# Patient Record
Sex: Female | Born: 1940 | Race: White | Hispanic: No | Marital: Single | Smoking: Current some day smoker
Health system: Southern US, Community
[De-identification: ages and names within clinical notes are randomized; demographics above are authoritative.]

## PROBLEM LIST (undated history)

## (undated) DIAGNOSIS — Z85528 Personal history of other malignant neoplasm of kidney: Secondary | ICD-10-CM

## (undated) DIAGNOSIS — N3941 Urge incontinence: Secondary | ICD-10-CM

## (undated) DIAGNOSIS — D649 Anemia, unspecified: Secondary | ICD-10-CM

## (undated) DIAGNOSIS — K219 Gastro-esophageal reflux disease without esophagitis: Secondary | ICD-10-CM

## (undated) DIAGNOSIS — F329 Major depressive disorder, single episode, unspecified: Secondary | ICD-10-CM

## (undated) DIAGNOSIS — K529 Noninfective gastroenteritis and colitis, unspecified: Secondary | ICD-10-CM

## (undated) DIAGNOSIS — F32A Depression, unspecified: Secondary | ICD-10-CM

## (undated) DIAGNOSIS — G2581 Restless legs syndrome: Secondary | ICD-10-CM

## (undated) DIAGNOSIS — K2950 Unspecified chronic gastritis without bleeding: Secondary | ICD-10-CM

## (undated) DIAGNOSIS — J449 Chronic obstructive pulmonary disease, unspecified: Secondary | ICD-10-CM

## (undated) DIAGNOSIS — Z8719 Personal history of other diseases of the digestive system: Secondary | ICD-10-CM

## (undated) DIAGNOSIS — C679 Malignant neoplasm of bladder, unspecified: Secondary | ICD-10-CM

## (undated) DIAGNOSIS — M810 Age-related osteoporosis without current pathological fracture: Secondary | ICD-10-CM

## (undated) DIAGNOSIS — E785 Hyperlipidemia, unspecified: Secondary | ICD-10-CM

## (undated) DIAGNOSIS — Q600 Renal agenesis, unilateral: Secondary | ICD-10-CM

## (undated) DIAGNOSIS — K449 Diaphragmatic hernia without obstruction or gangrene: Secondary | ICD-10-CM

## (undated) DIAGNOSIS — K5730 Diverticulosis of large intestine without perforation or abscess without bleeding: Secondary | ICD-10-CM

## (undated) DIAGNOSIS — K269 Duodenal ulcer, unspecified as acute or chronic, without hemorrhage or perforation: Secondary | ICD-10-CM

## (undated) DIAGNOSIS — M1990 Unspecified osteoarthritis, unspecified site: Secondary | ICD-10-CM

## (undated) DIAGNOSIS — Z8551 Personal history of malignant neoplasm of bladder: Secondary | ICD-10-CM

## (undated) DIAGNOSIS — F411 Generalized anxiety disorder: Secondary | ICD-10-CM

## (undated) HISTORY — PX: LAPAROSCOPIC CHOLECYSTECTOMY: SUR755

## (undated) HISTORY — PX: COLON SURGERY: SHX602

## (undated) HISTORY — PX: OTHER SURGICAL HISTORY: SHX169

## (undated) HISTORY — PX: HEMICOLECTOMY: SHX854

## (undated) HISTORY — PX: INGUINAL HERNIA REPAIR: SUR1180

---

## 1971-08-20 HISTORY — PX: ABDOMINAL HYSTERECTOMY: SHX81

## 2014-08-19 DIAGNOSIS — IMO0002 Reserved for concepts with insufficient information to code with codable children: Secondary | ICD-10-CM

## 2014-08-19 HISTORY — DX: Reserved for concepts with insufficient information to code with codable children: IMO0002

## 2015-08-20 DIAGNOSIS — Z8551 Personal history of malignant neoplasm of bladder: Secondary | ICD-10-CM

## 2015-08-20 HISTORY — DX: Personal history of malignant neoplasm of bladder: Z85.51

## 2017-08-01 ENCOUNTER — Encounter (HOSPITAL_COMMUNITY): Payer: Self-pay

## 2017-08-01 ENCOUNTER — Emergency Department (HOSPITAL_COMMUNITY): Payer: Medicare (Managed Care)

## 2017-08-01 ENCOUNTER — Inpatient Hospital Stay (HOSPITAL_COMMUNITY)
Admission: EM | Admit: 2017-08-01 | Discharge: 2017-08-08 | DRG: 377 | Disposition: A | Discharge status: Home / Self Care | Payer: Medicare (Managed Care) | Attending: Internal Medicine | Admitting: Internal Medicine

## 2017-08-01 ENCOUNTER — Other Ambulatory Visit: Payer: Self-pay

## 2017-08-01 DIAGNOSIS — E43 Unspecified severe protein-calorie malnutrition: Secondary | ICD-10-CM

## 2017-08-01 DIAGNOSIS — D500 Iron deficiency anemia secondary to blood loss (chronic): Secondary | ICD-10-CM | POA: Diagnosis present

## 2017-08-01 DIAGNOSIS — R578 Other shock: Secondary | ICD-10-CM | POA: Diagnosis present

## 2017-08-01 DIAGNOSIS — K632 Fistula of intestine: Secondary | ICD-10-CM | POA: Diagnosis present

## 2017-08-01 DIAGNOSIS — Z681 Body mass index (BMI) 19 or less, adult: Secondary | ICD-10-CM

## 2017-08-01 DIAGNOSIS — K269 Duodenal ulcer, unspecified as acute or chronic, without hemorrhage or perforation: Secondary | ICD-10-CM | POA: Diagnosis not present

## 2017-08-01 DIAGNOSIS — K267 Chronic duodenal ulcer without hemorrhage or perforation: Secondary | ICD-10-CM | POA: Diagnosis present

## 2017-08-01 DIAGNOSIS — E876 Hypokalemia: Secondary | ICD-10-CM | POA: Diagnosis not present

## 2017-08-01 DIAGNOSIS — I714 Abdominal aortic aneurysm, without rupture: Secondary | ICD-10-CM | POA: Diagnosis present

## 2017-08-01 DIAGNOSIS — K315 Obstruction of duodenum: Secondary | ICD-10-CM | POA: Diagnosis present

## 2017-08-01 DIAGNOSIS — D62 Acute posthemorrhagic anemia: Secondary | ICD-10-CM | POA: Diagnosis present

## 2017-08-01 DIAGNOSIS — K449 Diaphragmatic hernia without obstruction or gangrene: Secondary | ICD-10-CM | POA: Diagnosis present

## 2017-08-01 DIAGNOSIS — K922 Gastrointestinal hemorrhage, unspecified: Secondary | ICD-10-CM | POA: Diagnosis not present

## 2017-08-01 DIAGNOSIS — K2980 Duodenitis without bleeding: Secondary | ICD-10-CM | POA: Diagnosis present

## 2017-08-01 DIAGNOSIS — Z8719 Personal history of other diseases of the digestive system: Secondary | ICD-10-CM

## 2017-08-01 DIAGNOSIS — F17210 Nicotine dependence, cigarettes, uncomplicated: Secondary | ICD-10-CM | POA: Diagnosis present

## 2017-08-01 DIAGNOSIS — F419 Anxiety disorder, unspecified: Secondary | ICD-10-CM | POA: Diagnosis present

## 2017-08-01 DIAGNOSIS — G2581 Restless legs syndrome: Secondary | ICD-10-CM | POA: Diagnosis present

## 2017-08-01 DIAGNOSIS — E785 Hyperlipidemia, unspecified: Secondary | ICD-10-CM | POA: Diagnosis present

## 2017-08-01 DIAGNOSIS — K264 Chronic or unspecified duodenal ulcer with hemorrhage: Secondary | ICD-10-CM | POA: Diagnosis present

## 2017-08-01 DIAGNOSIS — Z905 Acquired absence of kidney: Secondary | ICD-10-CM

## 2017-08-01 DIAGNOSIS — L988 Other specified disorders of the skin and subcutaneous tissue: Secondary | ICD-10-CM

## 2017-08-01 DIAGNOSIS — J449 Chronic obstructive pulmonary disease, unspecified: Secondary | ICD-10-CM | POA: Diagnosis present

## 2017-08-01 DIAGNOSIS — R51 Headache: Secondary | ICD-10-CM | POA: Diagnosis not present

## 2017-08-01 HISTORY — DX: Hyperlipidemia, unspecified: E78.5

## 2017-08-01 HISTORY — DX: Duodenal ulcer, unspecified as acute or chronic, without hemorrhage or perforation: K26.9

## 2017-08-01 HISTORY — DX: Depression, unspecified: F32.A

## 2017-08-01 HISTORY — DX: Personal history of other diseases of the digestive system: Z87.19

## 2017-08-01 HISTORY — DX: Major depressive disorder, single episode, unspecified: F32.9

## 2017-08-01 LAB — URINALYSIS, ROUTINE W REFLEX MICROSCOPIC
BILIRUBIN URINE: NEGATIVE
Glucose, UA: NEGATIVE mg/dL
Hgb urine dipstick: NEGATIVE
KETONES UR: NEGATIVE mg/dL
Nitrite: NEGATIVE
Protein, ur: NEGATIVE mg/dL
Specific Gravity, Urine: 1.011 (ref 1.005–1.030)
pH: 6 (ref 5.0–8.0)

## 2017-08-01 LAB — COMPREHENSIVE METABOLIC PANEL
ALK PHOS: 127 U/L — AB (ref 38–126)
ALT: 9 U/L — AB (ref 14–54)
AST: 16 U/L (ref 15–41)
Albumin: 3.6 g/dL (ref 3.5–5.0)
Anion gap: 8 (ref 5–15)
BUN: 14 mg/dL (ref 6–20)
CALCIUM: 9 mg/dL (ref 8.9–10.3)
CHLORIDE: 107 mmol/L (ref 101–111)
CO2: 23 mmol/L (ref 22–32)
CREATININE: 0.92 mg/dL (ref 0.44–1.00)
GFR, EST NON AFRICAN AMERICAN: 60 mL/min — AB (ref >60)
Glucose, Bld: 113 mg/dL — ABNORMAL HIGH (ref 65–99)
Potassium: 4.6 mmol/L (ref 3.5–5.1)
Sodium: 138 mmol/L (ref 135–145)
Total Bilirubin: 0.2 mg/dL — ABNORMAL LOW (ref 0.3–1.2)
Total Protein: 7.2 g/dL (ref 6.5–8.1)

## 2017-08-01 LAB — CBC
HCT: 20.1 % — ABNORMAL LOW (ref 36.0–46.0)
HEMATOCRIT: 25.9 % — AB (ref 36.0–46.0)
HEMOGLOBIN: 7.9 g/dL — AB (ref 12.0–15.0)
Hemoglobin: 6 g/dL — CL (ref 12.0–15.0)
MCH: 22.6 pg — ABNORMAL LOW (ref 26.0–34.0)
MCH: 23.9 pg — ABNORMAL LOW (ref 26.0–34.0)
MCHC: 29.9 g/dL — ABNORMAL LOW (ref 30.0–36.0)
MCHC: 30.5 g/dL (ref 30.0–36.0)
MCV: 75.8 fL — AB (ref 78.0–100.0)
MCV: 78.5 fL (ref 78.0–100.0)
PLATELETS: 251 10*3/uL (ref 150–400)
PLATELETS: 253 10*3/uL (ref 150–400)
RBC: 2.65 MIL/uL — ABNORMAL LOW (ref 3.87–5.11)
RBC: 3.3 MIL/uL — AB (ref 3.87–5.11)
RDW: 17 % — AB (ref 11.5–15.5)
RDW: 17.5 % — ABNORMAL HIGH (ref 11.5–15.5)
WBC: 9.1 10*3/uL (ref 4.0–10.5)
WBC: 6.9 10*3/uL (ref 4.0–10.5)

## 2017-08-01 LAB — CBC WITH DIFFERENTIAL/PLATELET
BASOS ABS: 0 10*3/uL (ref 0.0–0.1)
Basophils Relative: 0 %
EOS PCT: 2 %
Eosinophils Absolute: 0.3 10*3/uL (ref 0.0–0.7)
HCT: 26.3 % — ABNORMAL LOW (ref 36.0–46.0)
HEMOGLOBIN: 7.7 g/dL — AB (ref 12.0–15.0)
LYMPHS ABS: 0.6 10*3/uL — AB (ref 0.7–4.0)
LYMPHS PCT: 4 %
MCH: 22.1 pg — AB (ref 26.0–34.0)
MCHC: 29.3 g/dL — ABNORMAL LOW (ref 30.0–36.0)
MCV: 75.6 fL — AB (ref 78.0–100.0)
Monocytes Absolute: 0.7 10*3/uL (ref 0.1–1.0)
Monocytes Relative: 5 %
NEUTROS PCT: 89 %
Neutro Abs: 12.5 10*3/uL — ABNORMAL HIGH (ref 1.7–7.7)
PLATELETS: 398 10*3/uL (ref 150–400)
RBC: 3.48 MIL/uL — AB (ref 3.87–5.11)
RDW: 17.1 % — ABNORMAL HIGH (ref 11.5–15.5)
WBC: 14.2 10*3/uL — AB (ref 4.0–10.5)

## 2017-08-01 LAB — HEPATIC FUNCTION PANEL
ALBUMIN: 2.7 g/dL — AB (ref 3.5–5.0)
ALT: 7 U/L — ABNORMAL LOW (ref 14–54)
AST: 13 U/L — AB (ref 15–41)
Alkaline Phosphatase: 98 U/L (ref 38–126)
Bilirubin, Direct: <0.1 mg/dL — ABNORMAL LOW (ref 0.1–0.5)
TOTAL PROTEIN: 5.6 g/dL — AB (ref 6.5–8.1)
Total Bilirubin: 0.6 mg/dL (ref 0.3–1.2)

## 2017-08-01 LAB — APTT: APTT: 26 s (ref 24–36)

## 2017-08-01 LAB — POC OCCULT BLOOD, ED: FECAL OCCULT BLD: POSITIVE — AB

## 2017-08-01 LAB — PROTIME-INR
INR: 0.98
PROTHROMBIN TIME: 12.9 s (ref 11.4–15.2)

## 2017-08-01 LAB — PHOSPHORUS: Phosphorus: 3.1 mg/dL (ref 2.5–4.6)

## 2017-08-01 LAB — PREPARE RBC (CROSSMATCH)

## 2017-08-01 LAB — MAGNESIUM: Magnesium: 1.5 mg/dL — ABNORMAL LOW (ref 1.7–2.4)

## 2017-08-01 LAB — MRSA PCR SCREENING: MRSA by PCR: NEGATIVE

## 2017-08-01 LAB — I-STAT CG4 LACTIC ACID, ED: Lactic Acid, Venous: 1 mmol/L (ref 0.5–1.9)

## 2017-08-01 LAB — LIPASE, BLOOD: LIPASE: 23 U/L (ref 11–51)

## 2017-08-01 LAB — ABO/RH: ABO/RH(D): O POS

## 2017-08-01 MED ORDER — LACTATED RINGERS IV BOLUS (SEPSIS)
1000.0000 mL | Freq: Once | INTRAVENOUS | Status: AC
  Administered 2017-08-01: 1000 mL via INTRAVENOUS

## 2017-08-01 MED ORDER — SODIUM CHLORIDE 0.9 % IV SOLN: Freq: Once | INTRAVENOUS | Status: DC

## 2017-08-01 MED ORDER — IOPAMIDOL (ISOVUE-300) INJECTION 61%
INTRAVENOUS | Status: AC
  Administered 2017-08-01: 100 mL
  Filled 2017-08-01: qty 100

## 2017-08-01 MED ORDER — MORPHINE SULFATE (PF) 4 MG/ML IV SOLN
4.0000 mg | Freq: Once | INTRAVENOUS | Status: AC
  Administered 2017-08-01: 4 mg via INTRAVENOUS
  Filled 2017-08-01: qty 1

## 2017-08-01 MED ORDER — ASPIRIN 300 MG RE SUPP: 300.0000 mg | RECTAL | Status: DC

## 2017-08-01 MED ORDER — SODIUM CHLORIDE 0.9 % IV SOLN
8.0000 mg/h | INTRAVENOUS | Status: AC
  Administered 2017-08-01 – 2017-08-04 (×7): 8 mg/h via INTRAVENOUS
  Filled 2017-08-01 (×10): qty 80

## 2017-08-01 MED ORDER — METRONIDAZOLE IN NACL 5-0.79 MG/ML-% IV SOLN: 500.0000 mg | Freq: Once | INTRAVENOUS | Status: DC

## 2017-08-01 MED ORDER — DEXTROSE 5 % IV SOLN: 2.0000 g | Freq: Once | INTRAVENOUS | Status: DC

## 2017-08-01 MED ORDER — PANTOPRAZOLE SODIUM 40 MG IV SOLR
40.0000 mg | Freq: Once | INTRAVENOUS | Status: AC
  Administered 2017-08-01: 40 mg via INTRAVENOUS
  Filled 2017-08-01: qty 40

## 2017-08-01 MED ORDER — METOCLOPRAMIDE HCL 5 MG/ML IJ SOLN
5.0000 mg | Freq: Once | INTRAMUSCULAR | Status: AC
  Administered 2017-08-01: 5 mg via INTRAVENOUS
  Filled 2017-08-01: qty 2

## 2017-08-01 MED ORDER — ASPIRIN 81 MG PO CHEW: 324.0000 mg | CHEWABLE_TABLET | ORAL | Status: DC

## 2017-08-01 MED ORDER — FENTANYL CITRATE (PF) 100 MCG/2ML IJ SOLN
12.5000 ug | INTRAMUSCULAR | Status: DC | PRN
  Administered 2017-08-01 – 2017-08-08 (×34): 12.5 ug via INTRAVENOUS
  Filled 2017-08-01 (×35): qty 2

## 2017-08-01 MED ORDER — SODIUM CHLORIDE 0.9 % IV SOLN: 250.0000 mL | INTRAVENOUS | Status: DC | PRN

## 2017-08-01 MED ORDER — POTASSIUM CHLORIDE CRYS ER 20 MEQ PO TBCR: 20.0000 meq | EXTENDED_RELEASE_TABLET | Freq: Once | ORAL | Status: DC

## 2017-08-01 MED ORDER — METRONIDAZOLE IN NACL 5-0.79 MG/ML-% IV SOLN
500.0000 mg | Freq: Three times a day (TID) | INTRAVENOUS | Status: DC
  Administered 2017-08-01 – 2017-08-04 (×8): 500 mg via INTRAVENOUS
  Filled 2017-08-01 (×10): qty 100

## 2017-08-01 MED ORDER — CIPROFLOXACIN IN D5W 400 MG/200ML IV SOLN
400.0000 mg | Freq: Two times a day (BID) | INTRAVENOUS | Status: DC
  Administered 2017-08-02 – 2017-08-04 (×5): 400 mg via INTRAVENOUS
  Filled 2017-08-01 (×6): qty 200

## 2017-08-01 MED ORDER — SODIUM CHLORIDE 0.9 % IV BOLUS (SEPSIS)
1000.0000 mL | Freq: Once | INTRAVENOUS | Status: AC
  Administered 2017-08-01: 1000 mL via INTRAVENOUS

## 2017-08-01 MED ORDER — PANTOPRAZOLE SODIUM 40 MG IV SOLR
40.0000 mg | Freq: Two times a day (BID) | INTRAVENOUS | Status: DC
  Administered 2017-08-05 – 2017-08-07 (×6): 40 mg via INTRAVENOUS
  Filled 2017-08-01 (×6): qty 40

## 2017-08-01 MED ORDER — MAGNESIUM SULFATE 4 GM/100ML IV SOLN
4.0000 g | Freq: Once | INTRAVENOUS | Status: AC
  Administered 2017-08-01: 4 g via INTRAVENOUS
  Filled 2017-08-01: qty 100

## 2017-08-01 MED ORDER — DIPHENHYDRAMINE HCL 50 MG/ML IJ SOLN
12.5000 mg | Freq: Once | INTRAMUSCULAR | Status: AC
  Administered 2017-08-01: 12.5 mg via INTRAVENOUS
  Filled 2017-08-01: qty 1

## 2017-08-01 MED ORDER — SODIUM CHLORIDE 0.9 % IV SOLN
250.0000 mL | INTRAVENOUS | Status: DC | PRN
  Administered 2017-08-01 – 2017-08-02 (×2): 250 mL via INTRAVENOUS

## 2017-08-01 MED ORDER — POTASSIUM CHLORIDE 10 MEQ/100ML IV SOLN
10.0000 meq | Freq: Once | INTRAVENOUS | Status: AC
  Administered 2017-08-01: 10 meq via INTRAVENOUS
  Filled 2017-08-01: qty 100

## 2017-08-01 MED ORDER — CIPROFLOXACIN IN D5W 400 MG/200ML IV SOLN
400.0000 mg | Freq: Once | INTRAVENOUS | Status: AC
  Administered 2017-08-01: 400 mg via INTRAVENOUS
  Filled 2017-08-01: qty 200

## 2017-08-01 MED ORDER — ROPINIROLE HCL 1 MG PO TABS
1.0000 mg | ORAL_TABLET | Freq: Once | ORAL | Status: DC
  Filled 2017-08-01: qty 1

## 2017-08-01 MED ORDER — ONDANSETRON HCL 4 MG/2ML IJ SOLN
4.0000 mg | Freq: Once | INTRAMUSCULAR | Status: AC
  Administered 2017-08-01: 4 mg via INTRAVENOUS
  Filled 2017-08-01: qty 2

## 2017-08-01 NOTE — ED Notes (Signed)
Patient transported to X-ray 

## 2017-08-01 NOTE — ED Triage Notes (Signed)
Transported from home by [facility] for generalized abdominal pain x 1 week with associated n/v/d. Pt reports bloody stool 2 days ago. Hx of diverticulitis per EMS.

## 2017-08-01 NOTE — Consult Note (Signed)
Referring Provider:  Dr. Myrene Buddy Primary Care Physician:  System, Pcp Not In Primary Gastroenterologist:  Althia Forts  Reason for Consultation:  Duodenal ulcer with possible fistulization into transverse colon  HPI: Erica York is a 76 y.o. female with past medical history of duodenal ulcer 40 years ago, history of diverticulitis status post partial colectomy, multiple hernia repairs and history of NSAID use came into the hospital for further evaluation of abdominal pain.  Patient started noticing epigastric pain around 2 weeks ago. Described as a sharp with radiation towards back. Associated with nausea and nonbloody vomiting. Patient had 1 episode of black tarry stool and bright red blood per rectum one week ago which is resolved now. She noted worsening of her epigastric pain 2 days ago and decided to come to the hospital for further evaluation. Last bowel movement this morning which was normal according to patient. She takes Excedrin on a daily basis for headache. Denied use of any PPI.  Not sure of any previous endoscopy.  Past Medical History:  Diagnosis Date  . Anxiety   . Depression   . Hyperlipidemia     PMH :  - History of duodenal ulcer - Anxiety -Restless leg syndrome  PSH : - Status post partial colon resection for diverticulitis - History of hernia repair  FH : Negative for colon cancer.    Prior to Admission medications   Medication Sig Start Date End Date Taking? Authorizing Provider  ALPRAZolam Duanne Moron) 0.5 MG tablet Take 0.5 mg by mouth 3 (three) times daily as needed for anxiety.   Yes [provider]  colesevelam (WELCHOL) 625 MG tablet Take 625 mg by mouth daily.   Yes [provider]  potassium chloride SA (K-DUR,KLOR-CON) 20 MEQ tablet Take 20 mEq by mouth 2 (two) times daily.   Yes [provider]  rOPINIRole (REQUIP) 1 MG tablet Take 0.5-1 mg by mouth at bedtime as needed (Sleep).   Yes [provider]  sertraline  (ZOLOFT) 100 MG tablet Take 100 mg by mouth at bedtime.   Yes [provider]    Scheduled Meds: . aspirin  324 mg Oral NOW   Or  . aspirin  300 mg Rectal NOW  . [START ON 08/05/2017] pantoprazole  40 mg Intravenous Q12H   Continuous Infusions: . sodium chloride    . sodium chloride    . ceFEPime (MAXIPIME) IV    . lactated ringers    . metronidazole    . pantoprozole (PROTONIX) infusion 8 mg/hr (08/01/17 1233)   PRN Meds:.  Allergies as of 08/01/2017 - Review Complete 08/01/2017  Allergen Reaction Noted  . Zosyn [piperacillin sod-tazobactam so]  08/01/2017    History reviewed. No pertinent family history.  Social History   Socioeconomic History  . Marital status: Single    Spouse name: Not on file  . Number of children: Not on file  . Years of education: Not on file  . Highest education level: Not on file  Social Needs  . Financial resource strain: Not on file  . Food insecurity - worry: Not on file  . Food insecurity - inability: Not on file  . Transportation needs - medical: Not on file  . Transportation needs - non-medical: Not on file  Occupational History  . Not on file  Tobacco Use  . Smoking status: Current Some Day Smoker    Types: Cigarettes  . Smokeless tobacco: Never Used  Substance and Sexual Activity  . Alcohol use: No    Frequency:  Never  . Drug use: No  . Sexual activity: No  Other Topics Concern  . Not on file  Social History Narrative  . Not on file    Review of Systems: Review of Systems  Constitutional: Negative for chills and fever.  HENT: Negative for hearing loss and tinnitus.   Eyes: Negative for blurred vision and double vision.  Respiratory: Negative for cough and hemoptysis.   Cardiovascular: Negative for chest pain and palpitations.  Gastrointestinal: Positive for abdominal pain, blood in stool, heartburn, melena, nausea and vomiting. Negative for constipation and diarrhea.  Genitourinary: Negative for dysuria and  urgency.  Musculoskeletal: Positive for back pain and joint pain. Negative for myalgias and neck pain.  Skin: Negative for itching and rash.  Neurological: Positive for headaches. Negative for focal weakness and seizures.  Endo/Heme/Allergies: Does not bruise/bleed easily.  Psychiatric/Behavioral: Negative for hallucinations and suicidal ideas.    Physical Exam: Vital signs: Vitals:   08/01/17 1255 08/01/17 1338  BP: (!) 91/51 108/65  Pulse: 87 74  Resp:  20  Temp:  97.9 F (36.6 C)  SpO2:  100%     Physical Exam  Constitutional: She is oriented to person, place, and time. She appears well-developed and well-nourished. No distress.  HENT:  Head: Normocephalic and atraumatic.  Mouth/Throat: No oropharyngeal exudate.  Eyes: EOM are normal. No scleral icterus.  Neck: Normal range of motion. Neck supple. No thyromegaly present.  Cardiovascular: Normal rate, regular rhythm and normal heart sounds.  Pulmonary/Chest: Effort normal and breath sounds normal. No respiratory distress.  Abdominal: Soft. Bowel sounds are normal. She exhibits no distension. There is tenderness. There is no rebound and no guarding.  Scar marks from previous surgery with a small ventral hernia and epigastric and left upper quadrant tenderness to palpation. No peritoneal signs  Musculoskeletal: Normal range of motion. She exhibits no edema.  Neurological: She is alert and oriented to person, place, and time.  Skin: Skin is dry. No erythema.  Psychiatric: She has a normal mood and affect. Thought content normal.  Vitals reviewed.   GI:  Lab Results: Recent Labs    08/01/17 0752 08/01/17 1151  WBC 14.2* 9.1  HGB 7.7* 6.0*  HCT 26.3* 20.1*  PLT 398 251   BMET Recent Labs    08/01/17 0752  NA 138  K 4.6  CL 107  CO2 23  GLUCOSE 113*  BUN 14  CREATININE 0.92  CALCIUM 9.0   LFT Recent Labs    08/01/17 0752  PROT 7.2  ALBUMIN 3.6  AST 16  ALT 9*  ALKPHOS 127*  BILITOT 0.2*    PT/INR No results for input(s): LABPROT, INR in the last 72 hours.   Studies/Results: Ct Abdomen Pelvis W Contrast  Result Date: 08/01/2017 CLINICAL DATA:  Generalized abdominal pain, nausea, vomiting and diarrhea for 1 week with bloody stools. EXAM: CT ABDOMEN AND PELVIS WITH CONTRAST TECHNIQUE: Multidetector CT imaging of the abdomen and pelvis was performed using the standard protocol following bolus administration of intravenous contrast. CONTRAST:  32mL ISOVUE-300 IOPAMIDOL (ISOVUE-300) INJECTION 61% COMPARISON:  08/01/2017 abdominal radiographs . FINDINGS: Lower chest: Advanced emphysema at the lung bases. A few scattered small pulmonary nodules at both lung bases, largest 4 mm in the lingula (series 6/ image 5). Mild scarring versus atelectasis in the dependent lung bases. Coronary atherosclerosis. Hepatobiliary: Normal liver size. Granulomatous right liver dome calcification. No liver mass. Cholecystectomy. Mild diffuse intrahepatic biliary ductal dilatation. Prominent common bile duct dilation (19 mm diameter, with  smooth distal tapering of the common bile duct. No radiopaque choledocholithiasis. Pancreas: Mild diffuse main pancreatic duct dilation up to 5 mm diameter. No discrete pancreatic mass. Spleen: Normal size spleen. Hypodense 0.5 cm posterior splenic lesion is too small to characterize. Adrenals/Urinary Tract: Normal adrenals. Status post left nephrectomy with no mass or fluid collection in the left nephrectomy bed. Compensatory hypertrophy of the right kidney. Hypodense 1.2 cm renal cortical lesion in the medial lower right kidney (series 7/image 12) with density 22 HU. Subcentimeter hypodense renal cortical lesion in the lateral upper right kidney, too small to characterize. No right hydronephrosis. There is curvilinear calcification in the anterior superior midline bladder wall. Otherwise normal bladder. Stomach/Bowel: Normal nondistended stomach. There is circumferential wall  thickening throughout the first and second portions of the duodenum with associated mucosal hyperenhancement and periduodenal fat stranding. There is a suggestion of abnormal fistulous communication of the duodenal bulb with the transverse colon (series 5/ image 52). There is mild wall thickening and mild dilatation throughout the transverse colon. Normal caliber small bowel. No additional sites of small bowel wall thickening. Appendix not discretely visualized. No pericecal inflammatory changes . Postsurgical changes are noted from partial distal colectomy with intact appearing distal colonic anastomosis. Mild residual diverticulosis in the remnant left colon with no additional sites of large bowel wall thickening. Vascular/Lymphatic: Atherosclerotic abdominal aorta with right-sided 3.0 cm saccular infrarenal aortic aneurysm. Patent hepatic, portal, splenic and right renal veins. No pathologically enlarged lymph nodes in the abdomen or pelvis. Reproductive: Status post hysterectomy, with no abnormal findings at the vaginal cuff. No adnexal mass. Other: No pneumoperitoneum, ascites or focal fluid collection. Evidence of prior ventral abdominal mesh repair with no evidence of a recurrent ventral abdominal hernia. Musculoskeletal: No aggressive appearing focal osseous lesions. Mild L1, mild to moderate L2, severe L4 and mild L5 vertebral compression fractures of uncertain chronicity. Marked lumbar spondylosis. Prominent levocurvature of the thoracolumbar spine. Diffuse osteopenia. IMPRESSION: 1. 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 No free air. No abscess. Recommend GI consultation for consideration of upper endoscopic correlation. 2. Prominent common bile duct dilation status post cholecystectomy (CBD diameter 19 mm). No radiopaque choledocholithiasis. Recommend correlation with liver function tests. 3. Aortic Atherosclerosis (ICD10-I70.0)  and Emphysema (ICD10-J43.9). Coronary atherosclerosis. 4. Saccular 3.0 cm infrarenal abdominal aortic aneurysm. Recommend followup by ultrasound in 3 years. This recommendation follows ACR consensus guidelines: White Paper of the ACR Incidental Findings Committee II on Vascular Findings. J Am Coll Radiol 2013; 10:789-794. 5. Multilevel lumbar vertebral compression fractures of uncertain chronicity, probably chronic. 6. Scattered tiny pulmonary nodules at the lung bases, largest 4 mm. No follow-up needed if patient is low-risk (and has no known or suspected primary neoplasm). Non-contrast chest CT can be considered in 12 months if patient is high-risk. This recommendation follows the consensus statement: Guidelines for Management of Incidental Pulmonary Nodules Detected on CT Images: From the Fleischner Society 2017; Radiology 2017; 284:228-243. 7. Indeterminate small 1.2 cm low-attenuation renal cortical lesion in the medial lower right kidney, recommend evaluation on a short term outpatient basis with MRI (preferred) or CT abdomen without and with IV contrast to exclude a renal neoplasm. Electronically Signed   By: Ilona Sorrel M.D.   On: 08/01/2017 11:56   Dg Abdomen Acute W/chest  Result Date: 08/01/2017 CLINICAL DATA:  Mid abdominal pain. EXAM: DG ABDOMEN ACUTE W/ 1V CHEST COMPARISON:  None. FINDINGS: There are dilated loops of bowel in  the mid abdomen, containing gas fluid levels with maximum diameter of 4.4 cm. It is difficult to determine whether these represent small or large bowel loops. Extensive surgical changes in the abdomen and pelvis. Large area of punctate calcifications in the pelvis with uncertain etiology. Normal cardiac silhouette. Calcific atherosclerotic disease of the aorta. Upper lobe predominant emphysematous changes of the lungs. No evidence of focal consolidation, pleural effusion or pneumothorax. IMPRESSION: Nonspecific bowel gas pattern with dilated bowel loops containing gas fluid  levels in the mid abdomen. It is difficult to determine whether these represent small or large bowel loops, given extensive postsurgical changes in the abdomen. If these represent small bowel loops, then this is evidence of small bowel ileus or early/ incomplete small bowel obstruction. Emphysematous changes of the lungs. Electronically Signed   By: Fidela Salisbury M.D.   On: 08/01/2017 10:41    Impression/Plan: - Epigastric abdominal pain with CT scan showing possible large duodenal ulcer with fistulization to the transverse colon.- Patient with history of duodenal ulcer and ongoing NSAID use. - Melena with bright red blood per rectum one week ago. Resolved now. Having normal bowel movements now. - Acute blood loss anemia. - NSAID use/ almost daily Excedrin use - History of diverticulitis status post partial colectomy  - H/O  Multiple abdominal hernia repairs.  Recommendations ------------------------- - Patient with hemoglobin of 6.0 at this time. Had a bleeding episode 1 week ago which is resolved now. CT scan findings of  possible large duodenal ulcer with fistulization to the transverse colon reviewed.  - Recommend supportive care for now  - Protonix drip - Transfuse as needed to keep hemoglobin around 8. Avoid over transfusion - Recommend surgical evaluation. Patient's abdominal exam findings are not concerning for acute abdomen at this time - Hold off on EGD at this time given possible high-risk of perforation. - Patient was advised to Avoid NSAIDs. - Keep nothing by mouth for now - GI will follow.    LOS: 0 days   Otis Brace  MD, FACP 08/01/2017, 2:01 PM  Contact #  [phone]

## 2017-08-01 NOTE — ED Notes (Signed)
Patient's acuity has been changed at this time.

## 2017-08-01 NOTE — Consult Note (Signed)
[facility] Consult Note  Dawnna Gritz May 02, 1941  [id number].    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  HPI:  Ms. Erica York is a 76 y/o F with a PMH duodenal ulcers, diverticulitis, multiple abdominal surgeries in [place], restless leg syndrome, and tumors of the kidney/bladder who presented to [facility] with worsening abdominal pain and melena. She reports about 2 weeks ago she started having upper abdominal pain described as tight epigastric pain with intermittent radiation to her back. Denies alleviating or aggravating factors. Associated sxs include non-bloody emesis and melena. Reports that one week ago she had 3 episodes of black stool back to back. After that her bowel movements went back to baseline, which she describes as daily, loose BMs. Her last BM was this morning and she describes it as loose and dark. She denies regular antacid/PPI use and takes Excedrin for her HA's. She denies use of blood thinning medications. She is unsure of when her last endoscopy was.  When asked about her surgical history she states she has had roughly 9 abdominal surgeries. Denies requiring surgery for her diagnosis of duodenal ulcer. She reports a history of multiple ventral hernia repairs, abdominal hysterectomy, as well as diverticulitis requiring a colostomy. She describes 3 surgeries to "move" her stoma before seeing a different surgeon for colostomy reversal around 5 years ago. She also endorses a history of "bowel twisting" and multiple operations for bowel obstructions where she think she has had scar tissue and small bowel resected. 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   Today patient states that she is hoping to avoid surgery - that she has had enough surgeries. She states that if surgery was required to save her life she would "probably" have  surgery but would not want any life saving measures performed if her heart were to stop beating.   ROS: Review of Systems  Constitutional: Positive for malaise/fatigue. Negative for chills and fever.  Gastrointestinal: Positive for abdominal pain, blood in stool, heartburn, melena, nausea and vomiting. Negative for constipation.  Musculoskeletal:       Worsening leg pain which she attributes to her restless leg syndrome  Neurological: Positive for weakness.  All other systems reviewed and are negative.   History reviewed. No pertinent family history.  Past Medical History:  Diagnosis Date  . Anxiety   . Depression   . Hyperlipidemia     Past Surgical History:  Procedure Laterality Date  . ABDOMINAL HYSTERECTOMY    . APPENDECTOMY    . BLADDER SURGERY     REMOVAL OF TUMORS  . CHOLECYSTECTOMY    . COLOSTOMY/REATTACHMENT    . LEFT NEPHRECTOMY      Social History:  reports that she has been smoking cigarettes.  she has never used smokeless tobacco. She reports that she does not drink alcohol or use drugs.  Allergies:  Allergies  Allergen Reactions  . Zosyn [Piperacillin Sod-Tazobactam So]      (Not in a hospital admission)  Blood pressure (!) 100/54, pulse 87, temperature 97.9 F (36.6 C), temperature source Oral, resp. rate 20, SpO2 100 %. Physical Exam: Physical Exam  Constitutional: She is oriented to person, place, and time. She appears well-developed.  Non-toxic appearance. She does not appear ill. No distress.  HENT:  Head: Normocephalic and atraumatic.  Mouth/Throat: Oropharynx is clear and moist.  Eyes: EOM are normal. No  scleral icterus.  Cardiovascular: Normal rate, regular rhythm, normal heart sounds and intact distal pulses. Exam reveals no friction rub.  No murmur heard. Pulmonary/Chest: Effort normal and breath sounds normal. No stridor. No respiratory distress. She has no wheezes. She has no rhonchi. She has no rales.  Abdominal: Soft. She exhibits  distension. Bowel sounds are decreased. There is generalized tenderness and tenderness in the epigastric area. There is rebound and guarding.  Multiple surgical scars noted, no umbilicus. Diffuse abdominal tenderness that is worse over epigastrium.   Neurological: She is alert and oriented to person, place, and time.  Skin: Skin is warm and dry. No rash noted.  Psychiatric: She has a normal mood and affect. Her behavior is normal.    Results for orders placed or performed during the hospital encounter of 08/01/17 (from the past 48 hour(s))  ABO/Rh     Status: None   Collection Time: 08/01/17  7:14 AM  Result Value Ref Range   ABO/RH(D) O POS   CBC with Differential     Status: Abnormal   Collection Time: 08/01/17  7:52 AM  Result Value Ref Range   WBC 14.2 (H) 4.0 - 10.5 K/uL   RBC 3.48 (L) 3.87 - 5.11 MIL/uL   Hemoglobin 7.7 (L) 12.0 - 15.0 g/dL   HCT 26.3 (L) 36.0 - 46.0 %   MCV 75.6 (L) 78.0 - 100.0 fL   MCH 22.1 (L) 26.0 - 34.0 pg   MCHC 29.3 (L) 30.0 - 36.0 g/dL   RDW 17.1 (H) 11.5 - 15.5 %   Platelets 398 150 - 400 K/uL   Neutrophils Relative % 89 %   Neutro Abs 12.5 (H) 1.7 - 7.7 K/uL   Lymphocytes Relative 4 %   Lymphs Abs 0.6 (L) 0.7 - 4.0 K/uL   Monocytes Relative 5 %   Monocytes Absolute 0.7 0.1 - 1.0 K/uL   Eosinophils Relative 2 %   Eosinophils Absolute 0.3 0.0 - 0.7 K/uL   Basophils Relative 0 %   Basophils Absolute 0.0 0.0 - 0.1 K/uL  Comprehensive metabolic panel     Status: Abnormal   Collection Time: 08/01/17  7:52 AM  Result Value Ref Range   Sodium 138 135 - 145 mmol/L   Potassium 4.6 3.5 - 5.1 mmol/L   Chloride 107 101 - 111 mmol/L   CO2 23 22 - 32 mmol/L   Glucose, Bld 113 (H) 65 - 99 mg/dL   BUN 14 6 - 20 mg/dL   Creatinine, Ser 0.92 0.44 - 1.00 mg/dL   Calcium 9.0 8.9 - 10.3 mg/dL   Total Protein 7.2 6.5 - 8.1 g/dL   Albumin 3.6 3.5 - 5.0 g/dL   AST 16 15 - 41 U/L   ALT 9 (L) 14 - 54 U/L   Alkaline Phosphatase 127 (H) 38 - 126 U/L   Total  Bilirubin 0.2 (L) 0.3 - 1.2 mg/dL   GFR calc non Af Amer 60 (L) >60 mL/min   GFR calc Af Amer >60 >60 mL/min    Comment: (NOTE) The eGFR has been calculated using the CKD EPI equation. This calculation has not been validated in all clinical situations. eGFR's persistently <60 mL/min signify possible Chronic Kidney Disease.    Anion gap 8 5 - 15  Lipase, blood     Status: None   Collection Time: 08/01/17  7:52 AM  Result Value Ref Range   Lipase 23 11 - 51 U/L  Urinalysis, Routine w reflex microscopic  Status: Abnormal   Collection Time: 08/01/17  8:38 AM  Result Value Ref Range   Color, Urine STRAW (A) YELLOW   APPearance CLEAR CLEAR   Specific Gravity, Urine 1.011 1.005 - 1.030   pH 6.0 5.0 - 8.0   Glucose, UA NEGATIVE NEGATIVE mg/dL   Hgb urine dipstick NEGATIVE NEGATIVE   Bilirubin Urine NEGATIVE NEGATIVE   Ketones, ur NEGATIVE NEGATIVE mg/dL   Protein, ur NEGATIVE NEGATIVE mg/dL   Nitrite NEGATIVE NEGATIVE   Leukocytes, UA TRACE (A) NEGATIVE   RBC / HPF 0-5 0 - 5 RBC/hpf   WBC, UA 0-5 0 - 5 WBC/hpf   Bacteria, UA RARE (A) NONE SEEN   Squamous Epithelial / LPF 0-5 (A) NONE SEEN   Mucus PRESENT   I-Stat CG4 Lactic Acid, ED     Status: None   Collection Time: 08/01/17  8:41 AM  Result Value Ref Range   Lactic Acid, Venous 1.00 0.5 - 1.9 mmol/L  Type and screen [place]     Status: None (Preliminary result)   Collection Time: 08/01/17 10:30 AM  Result Value Ref Range   ABO/RH(D) O POS    Antibody Screen NEG    Sample Expiration 08/04/2017    Unit Number [id number]    Blood Component Type RED CELLS,LR    Unit division 00    Status of Unit ISSUED    Transfusion Status OK TO TRANSFUSE    Crossmatch Result Compatible    Unit Number [id number]    Blood Component Type RED CELLS,LR    Unit division 00    Status of Unit ALLOCATED    Transfusion Status OK TO TRANSFUSE    Crossmatch Result Compatible    Unit Number [id number]    Blood  Component Type RED CELLS,LR    Unit division 00    Status of Unit ALLOCATED    Transfusion Status OK TO TRANSFUSE    Crossmatch Result Compatible   POC occult blood, ED     Status: Abnormal   Collection Time: 08/01/17 10:43 AM  Result Value Ref Range   Fecal Occult Bld POSITIVE (A) NEGATIVE  CBC     Status: Abnormal   Collection Time: 08/01/17 11:51 AM  Result Value Ref Range   WBC 9.1 4.0 - 10.5 K/uL   RBC 2.65 (L) 3.87 - 5.11 MIL/uL   Hemoglobin 6.0 (LL) 12.0 - 15.0 g/dL    Comment: REPEATED TO VERIFY CRITICAL RESULT CALLED TO, READ BACK BY AND VERIFIED WITH: CHENEY,A @1238  BLACKM12/14/18    HCT 20.1 (L) 36.0 - 46.0 %   MCV 75.8 (L) 78.0 - 100.0 fL   MCH 22.6 (L) 26.0 - 34.0 pg   MCHC 29.9 (L) 30.0 - 36.0 g/dL   RDW 17.0 (H) 11.5 - 15.5 %   Platelets 251 150 - 400 K/uL  Prepare RBC     Status: None   Collection Time: 08/01/17  1:30 PM  Result Value Ref Range   Order Confirmation ORDER PROCESSED BY BLOOD BANK    Ct Abdomen Pelvis W Contrast  Result Date: 08/01/2017 CLINICAL DATA:  Generalized abdominal pain, nausea, vomiting and diarrhea for 1 week with bloody stools. EXAM: CT ABDOMEN AND PELVIS WITH CONTRAST TECHNIQUE: Multidetector CT imaging of the abdomen and pelvis was performed using the standard protocol following bolus administration of intravenous contrast. CONTRAST:  4m ISOVUE-300 IOPAMIDOL (ISOVUE-300) INJECTION 61% COMPARISON:  08/01/2017 abdominal radiographs . FINDINGS: Lower chest: Advanced emphysema at the lung bases. A few scattered  small pulmonary nodules at both lung bases, largest 4 mm in the lingula (series 6/ image 5). Mild scarring versus atelectasis in the dependent lung bases. Coronary atherosclerosis. Hepatobiliary: Normal liver size. Granulomatous right liver dome calcification. No liver mass. Cholecystectomy. Mild diffuse intrahepatic biliary ductal dilatation. Prominent common bile duct dilation (19 mm diameter, with smooth distal tapering of the  common bile duct. No radiopaque choledocholithiasis. Pancreas: Mild diffuse main pancreatic duct dilation up to 5 mm diameter. No discrete pancreatic mass. Spleen: Normal size spleen. Hypodense 0.5 cm posterior splenic lesion is too small to characterize. Adrenals/Urinary Tract: Normal adrenals. Status post left nephrectomy with no mass or fluid collection in the left nephrectomy bed. Compensatory hypertrophy of the right kidney. Hypodense 1.2 cm renal cortical lesion in the medial lower right kidney (series 7/image 12) with density 22 HU. Subcentimeter hypodense renal cortical lesion in the lateral upper right kidney, too small to characterize. No right hydronephrosis. There is curvilinear calcification in the anterior superior midline bladder wall. Otherwise normal bladder. Stomach/Bowel: Normal nondistended stomach. There is circumferential wall thickening throughout the first and second portions of the duodenum with associated mucosal hyperenhancement and periduodenal fat stranding. There is a suggestion of abnormal fistulous communication of the duodenal bulb with the transverse colon (series 5/ image 52). There is mild wall thickening and mild dilatation throughout the transverse colon. Normal caliber small bowel. No additional sites of small bowel wall thickening. Appendix not discretely visualized. No pericecal inflammatory changes . Postsurgical changes are noted from partial distal colectomy with intact appearing distal colonic anastomosis. Mild residual diverticulosis in the remnant left colon with no additional sites of large bowel wall thickening. Vascular/Lymphatic: Atherosclerotic abdominal aorta with right-sided 3.0 cm saccular infrarenal aortic aneurysm. Patent hepatic, portal, splenic and right renal veins. No pathologically enlarged lymph nodes in the abdomen or pelvis. Reproductive: Status post hysterectomy, with no abnormal findings at the vaginal cuff. No adnexal mass. Other: No  pneumoperitoneum, ascites or focal fluid collection. Evidence of prior ventral abdominal mesh repair with no evidence of a recurrent ventral abdominal hernia. Musculoskeletal: No aggressive appearing focal osseous lesions. Mild L1, mild to moderate L2, severe L4 and mild L5 vertebral compression fractures of uncertain chronicity. Marked lumbar spondylosis. Prominent levocurvature of the thoracolumbar spine. Diffuse osteopenia. IMPRESSION: 1. 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 No free air. No abscess. Recommend GI consultation for consideration of upper endoscopic correlation. 2. Prominent common bile duct dilation status post cholecystectomy (CBD diameter 19 mm). No radiopaque choledocholithiasis. Recommend correlation with liver function tests. 3. Aortic Atherosclerosis (ICD10-I70.0) and Emphysema (ICD10-J43.9). Coronary atherosclerosis. 4. Saccular 3.0 cm infrarenal abdominal aortic aneurysm. Recommend followup by ultrasound in 3 years. This recommendation follows ACR consensus guidelines: White Paper of the ACR Incidental Findings Committee II on Vascular Findings. J Am Coll Radiol 2013; 10:789-794. 5. Multilevel lumbar vertebral compression fractures of uncertain chronicity, probably chronic. 6. Scattered tiny pulmonary nodules at the lung bases, largest 4 mm. No follow-up needed if patient is low-risk (and has no known or suspected primary neoplasm). Non-contrast chest CT can be considered in 12 months if patient is high-risk. This recommendation follows the consensus statement: Guidelines for Management of Incidental Pulmonary Nodules Detected on CT Images: From the Fleischner Society 2017; Radiology 2017; 284:228-243. 7. Indeterminate small 1.2 cm low-attenuation renal cortical lesion in the medial lower right kidney, recommend evaluation on a short term outpatient basis with MRI (preferred) or CT abdomen without and  with IV  contrast to exclude a renal neoplasm. Electronically Signed   By: Ilona Sorrel M.D.   On: 08/01/2017 11:56   Dg Abdomen Acute W/chest  Result Date: 08/01/2017 CLINICAL DATA:  Mid abdominal pain. EXAM: DG ABDOMEN ACUTE W/ 1V CHEST COMPARISON:  None. FINDINGS: There are dilated loops of bowel in the mid abdomen, containing gas fluid levels with maximum diameter of 4.4 cm. It is difficult to determine whether these represent small or large bowel loops. Extensive surgical changes in the abdomen and pelvis. Large area of punctate calcifications in the pelvis with uncertain etiology. Normal cardiac silhouette. Calcific atherosclerotic disease of the aorta. Upper lobe predominant emphysematous changes of the lungs. No evidence of focal consolidation, pleural effusion or pneumothorax. IMPRESSION: Nonspecific bowel gas pattern with dilated bowel loops containing gas fluid levels in the mid abdomen. It is difficult to determine whether these represent small or large bowel loops, given extensive postsurgical changes in the abdomen. If these represent small bowel loops, then this is evidence of small bowel ileus or early/ incomplete small bowel obstruction. Emphysematous changes of the lungs. Electronically Signed   By: Fidela Salisbury M.D.   On: 08/01/2017 10:41   Assessment/Plan Epigastric abdominal pain with a PMH duodenal ulcers and current NSAID use - CT abd/pelvis shows possible duodenal ulcer with concern for duodenocolonic fistula  Melena  ABL anemia  PMH multiple abdominal surgeries Restless leg syndrome  FEN: NPO, IVF, Protonix gtt ID: none  VTE: SCD's, hold chemical VTE due to anemia/UGI bleed  Plan: Agree with GI recommendation for supportive care with PPI gtt, blood transfusion, and observation. If patient shows persistent signs of bleeding I would ask GI to consider upper endoscopy for diagnosis/treatment. Fistula, if present, is likely chronic and is not an indication for emergent  surgery.  Should patient fail conservative management then surgery should be discussed again with the patient. During our discussion she was unsure if she would want surgery, as recovery is challenging and she has already been through many abdominal operations.   Jill Alexanders, [facility] Surgery 08/01/2017, 2:08 PM Pager: [phone] Consults: [phone] Mon-Fri 7:00 am-4:30 pm Sat-Sun 7:00 am-11:30 am

## 2017-08-01 NOTE — ED Notes (Signed)
Patient transported to CT 

## 2017-08-01 NOTE — ED Provider Notes (Signed)
[facility]-ICU/STEPDOWN Provider Note   CSN: [id number] Arrival date & time: 08/01/17  4128     History   Chief Complaint Chief Complaint  Patient presents with  . Abdominal Pain    HPI Erica York is a 76 y.o. female.  HPI   76 year old female with reported past medical history of ulcers as well as bowel obstructions and diverticulitis here with abdominal pain.  The patient reports that over the last 24 hours, she developed progressive worsening and now severe epigastric abdominal pain.  The patient has also developed associated nausea and black, tarry stools.  She denies any fevers.  She reports severe, diffuse abdominal pain that is progressively worsening.  She states her pain feels similar but worse to her prior ulcers.  She is not take aspirin or NSAIDs.  She is on blood thinners.  Pain is worse with eating and any kind of movement.  No specific alleviating factors.  Past Medical History:  Diagnosis Date  . Anxiety   . Depression   . Hyperlipidemia     There are no active problems to display for this patient.   Past Surgical History:  Procedure Laterality Date  . ABDOMINAL HYSTERECTOMY    . APPENDECTOMY    . BLADDER SURGERY     REMOVAL OF TUMORS  . CHOLECYSTECTOMY    . COLOSTOMY/REATTACHMENT    . LEFT NEPHRECTOMY      OB History    No data available       Home Medications    Prior to Admission medications   Medication Sig Start Date End Date Taking? Authorizing Provider  ALPRAZolam Duanne Moron) 0.5 MG tablet Take 0.5 mg by mouth 3 (three) times daily as needed for anxiety.   Yes [provider]  colesevelam (WELCHOL) 625 MG tablet Take 625 mg by mouth daily.   Yes [provider]  potassium chloride SA (K-DUR,KLOR-CON) 20 MEQ tablet Take 20 mEq by mouth 2 (two) times daily.   Yes [provider]  rOPINIRole (REQUIP) 1 MG tablet Take 0.5-1 mg by mouth at bedtime as needed (Sleep).   Yes [provider]  sertraline (ZOLOFT) 100 MG tablet Take 100 mg by mouth at bedtime.   Yes [provider]    Family History History reviewed. No pertinent family history.  Social History Social History   Tobacco Use  . Smoking status: Current Some Day Smoker    Types: Cigarettes  . Smokeless tobacco: Never Used  Substance Use Topics  . Alcohol use: No    Frequency: Never  . Drug use: No     Allergies   Zosyn [piperacillin sod-tazobactam so]   Review of Systems Review of Systems  Constitutional: Positive for fatigue.  Gastrointestinal: Positive for abdominal pain, blood in stool and nausea.  Neurological: Positive for weakness.  All other systems reviewed and are negative.    Physical Exam Updated Vital Signs BP (!) 135/56   Pulse 91   Temp 98.1 F (36.7 C) (Oral)   Resp 13   SpO2 99%   Physical Exam  Constitutional: She is oriented to person, place, and time. She appears well-developed and well-nourished. She appears distressed (Moaning in pain).  HENT:  Head: Normocephalic and atraumatic.  Eyes: Conjunctivae are normal.  Neck: Neck supple.  Cardiovascular: Normal rate, regular rhythm and normal heart sounds. Exam reveals no friction rub.  No murmur heard. Pulmonary/Chest: Effort normal and breath sounds normal. No respiratory distress. She has no wheezes. She has no  rales.  Abdominal: She exhibits no distension.  Severe epigastric and mild generalized abdominal tenderness.  There is rebound and guarding.  Musculoskeletal: She exhibits no edema.  Neurological: She is alert and oriented to person, place, and time. She exhibits normal muscle tone.  Skin: Skin is warm. Capillary refill takes less than 2 seconds.  Psychiatric: She has a normal mood and affect.  Nursing note and vitals reviewed.    ED Treatments / Results  Labs (all labs ordered are listed, but only abnormal results are displayed) Labs Reviewed  CBC WITH DIFFERENTIAL/PLATELET - Abnormal;  Notable for the following components:      Result Value   WBC 14.2 (*)    RBC 3.48 (*)    Hemoglobin 7.7 (*)    HCT 26.3 (*)    MCV 75.6 (*)    MCH 22.1 (*)    MCHC 29.3 (*)    RDW 17.1 (*)    Neutro Abs 12.5 (*)    Lymphs Abs 0.6 (*)    All other components within normal limits  COMPREHENSIVE METABOLIC PANEL - Abnormal; Notable for the following components:   Glucose, Bld 113 (*)    ALT 9 (*)    Alkaline Phosphatase 127 (*)    Total Bilirubin 0.2 (*)    GFR calc non Af Amer 60 (*)    All other components within normal limits  URINALYSIS, ROUTINE W REFLEX MICROSCOPIC - Abnormal; Notable for the following components:   Color, Urine STRAW (*)    Leukocytes, UA TRACE (*)    Bacteria, UA RARE (*)    Squamous Epithelial / LPF 0-5 (*)    All other components within normal limits  CBC - Abnormal; Notable for the following components:   RBC 2.65 (*)    Hemoglobin 6.0 (*)    HCT 20.1 (*)    MCV 75.8 (*)    MCH 22.6 (*)    MCHC 29.9 (*)    RDW 17.0 (*)    All other components within normal limits  POC OCCULT BLOOD, ED - Abnormal; Notable for the following components:   Fecal Occult Bld POSITIVE (*)    All other components within normal limits  CULTURE, BLOOD (ROUTINE X 2)  CULTURE, BLOOD (ROUTINE X 2)  MRSA PCR SCREENING  LIPASE, BLOOD  MAGNESIUM  PHOSPHORUS  PROTIME-INR  APTT  HEPATIC FUNCTION PANEL  CBC  CBC  COMPREHENSIVE METABOLIC PANEL  I-STAT CG4 LACTIC ACID, ED  TYPE AND SCREEN  ABO/RH  PREPARE RBC (CROSSMATCH)    EKG  EKG Interpretation None       Radiology Ct Abdomen Pelvis W Contrast  Result Date: 08/01/2017 CLINICAL DATA:  Generalized abdominal pain, nausea, vomiting and diarrhea for 1 week with bloody stools. EXAM: CT ABDOMEN AND PELVIS WITH CONTRAST TECHNIQUE: Multidetector CT imaging of the abdomen and pelvis was performed using the standard protocol following bolus administration of intravenous contrast. CONTRAST:  32mL ISOVUE-300 IOPAMIDOL  (ISOVUE-300) INJECTION 61% COMPARISON:  08/01/2017 abdominal radiographs . FINDINGS: Lower chest: Advanced emphysema at the lung bases. A few scattered small pulmonary nodules at both lung bases, largest 4 mm in the lingula (series 6/ image 5). Mild scarring versus atelectasis in the dependent lung bases. Coronary atherosclerosis. Hepatobiliary: Normal liver size. Granulomatous right liver dome calcification. No liver mass. Cholecystectomy. Mild diffuse intrahepatic biliary ductal dilatation. Prominent common bile duct dilation (19 mm diameter, with smooth distal tapering of the common bile duct. No radiopaque choledocholithiasis. Pancreas: Mild diffuse main pancreatic duct dilation  up to 5 mm diameter. No discrete pancreatic mass. Spleen: Normal size spleen. Hypodense 0.5 cm posterior splenic lesion is too small to characterize. Adrenals/Urinary Tract: Normal adrenals. Status post left nephrectomy with no mass or fluid collection in the left nephrectomy bed. Compensatory hypertrophy of the right kidney. Hypodense 1.2 cm renal cortical lesion in the medial lower right kidney (series 7/image 12) with density 22 HU. Subcentimeter hypodense renal cortical lesion in the lateral upper right kidney, too small to characterize. No right hydronephrosis. There is curvilinear calcification in the anterior superior midline bladder wall. Otherwise normal bladder. Stomach/Bowel: Normal nondistended stomach. There is circumferential wall thickening throughout the first and second portions of the duodenum with associated mucosal hyperenhancement and periduodenal fat stranding. There is a suggestion of abnormal fistulous communication of the duodenal bulb with the transverse colon (series 5/ image 52). There is mild wall thickening and mild dilatation throughout the transverse colon. Normal caliber small bowel. No additional sites of small bowel wall thickening. Appendix not discretely visualized. No pericecal inflammatory changes .  Postsurgical changes are noted from partial distal colectomy with intact appearing distal colonic anastomosis. Mild residual diverticulosis in the remnant left colon with no additional sites of large bowel wall thickening. Vascular/Lymphatic: Atherosclerotic abdominal aorta with right-sided 3.0 cm saccular infrarenal aortic aneurysm. Patent hepatic, portal, splenic and right renal veins. No pathologically enlarged lymph nodes in the abdomen or pelvis. Reproductive: Status post hysterectomy, with no abnormal findings at the vaginal cuff. No adnexal mass. Other: No pneumoperitoneum, ascites or focal fluid collection. Evidence of prior ventral abdominal mesh repair with no evidence of a recurrent ventral abdominal hernia. Musculoskeletal: No aggressive appearing focal osseous lesions. Mild L1, mild to moderate L2, severe L4 and mild L5 vertebral compression fractures of uncertain chronicity. Marked lumbar spondylosis. Prominent levocurvature of the thoracolumbar spine. Diffuse osteopenia. IMPRESSION: 1. 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 No free air. No abscess. Recommend GI consultation for consideration of upper endoscopic correlation. 2. Prominent common bile duct dilation status post cholecystectomy (CBD diameter 19 mm). No radiopaque choledocholithiasis. Recommend correlation with liver function tests. 3. Aortic Atherosclerosis (ICD10-I70.0) and Emphysema (ICD10-J43.9). Coronary atherosclerosis. 4. Saccular 3.0 cm infrarenal abdominal aortic aneurysm. Recommend followup by ultrasound in 3 years. This recommendation follows ACR consensus guidelines: White Paper of the ACR Incidental Findings Committee II on Vascular Findings. J Am Coll Radiol 2013; 10:789-794. 5. Multilevel lumbar vertebral compression fractures of uncertain chronicity, probably chronic. 6. Scattered tiny pulmonary nodules at the lung bases, largest 4 mm. No follow-up  needed if patient is low-risk (and has no known or suspected primary neoplasm). Non-contrast chest CT can be considered in 12 months if patient is high-risk. This recommendation follows the consensus statement: Guidelines for Management of Incidental Pulmonary Nodules Detected on CT Images: From the Fleischner Society 2017; Radiology 2017; 284:228-243. 7. Indeterminate small 1.2 cm low-attenuation renal cortical lesion in the medial lower right kidney, recommend evaluation on a short term outpatient basis with MRI (preferred) or CT abdomen without and with IV contrast to exclude a renal neoplasm. Electronically Signed   By: Ilona Sorrel M.D.   On: 08/01/2017 11:56   Dg Abdomen Acute W/chest  Result Date: 08/01/2017 CLINICAL DATA:  Mid abdominal pain. EXAM: DG ABDOMEN ACUTE W/ 1V CHEST COMPARISON:  None. FINDINGS: There are dilated loops of bowel in the mid abdomen, containing gas fluid levels with maximum diameter of 4.4 cm. It is difficult to determine  whether these represent small or large bowel loops. Extensive surgical changes in the abdomen and pelvis. Large area of punctate calcifications in the pelvis with uncertain etiology. Normal cardiac silhouette. Calcific atherosclerotic disease of the aorta. Upper lobe predominant emphysematous changes of the lungs. No evidence of focal consolidation, pleural effusion or pneumothorax. IMPRESSION: Nonspecific bowel gas pattern with dilated bowel loops containing gas fluid levels in the mid abdomen. It is difficult to determine whether these represent small or large bowel loops, given extensive postsurgical changes in the abdomen. If these represent small bowel loops, then this is evidence of small bowel ileus or early/ incomplete small bowel obstruction. Emphysematous changes of the lungs. Electronically Signed   By: Fidela Salisbury M.D.   On: 08/01/2017 10:41    Procedures .Critical Care Performed by: Duffy Bruce, MD Authorized by: Duffy Bruce,  MD   Critical care provider statement:    Critical care time (minutes):  35   Critical care time was exclusive of:  Separately billable procedures and treating other patients and teaching time   Critical care was necessary to treat or prevent imminent or life-threatening deterioration of the following conditions:  Circulatory failure, dehydration and cardiac failure   Critical care was time spent personally by me on the following activities:  Development of treatment plan with patient or surrogate, discussions with consultants, evaluation of patient's response to treatment, examination of patient, obtaining history from patient or surrogate, ordering and performing treatments and interventions, ordering and review of laboratory studies, ordering and review of radiographic studies, pulse oximetry, re-evaluation of patient's condition and review of old charts   I assumed direction of critical care for this patient from another provider in my specialty: no     (including critical care time)  Medications Ordered in ED Medications  pantoprazole (PROTONIX) 80 mg in sodium chloride 0.9 % 250 mL (0.32 mg/mL) infusion (8 mg/hr Intravenous New Bag/Given 08/01/17 1233)  pantoprazole (PROTONIX) injection 40 mg (not administered)  0.9 %  sodium chloride infusion ( Intravenous Not Given 08/01/17 1721)  lactated ringers bolus 1,000 mL (1,000 mLs Intravenous New Bag/Given 08/01/17 1609)  ciprofloxacin (CIPRO) IVPB 400 mg (not administered)  0.9 %  sodium chloride infusion (250 mLs Intravenous New Bag/Given 08/01/17 1711)  fentaNYL (SUBLIMAZE) injection 12.5 mcg (not administered)  metroNIDAZOLE (FLAGYL) IVPB 500 mg (500 mg Intravenous New Bag/Given 08/01/17 1711)  sodium chloride 0.9 % bolus 1,000 mL (0 mLs Intravenous Stopped 08/01/17 1233)  morphine 4 MG/ML injection 4 mg (4 mg Intravenous Given 08/01/17 0837)  ondansetron (ZOFRAN) injection 4 mg (4 mg Intravenous Given 08/01/17 0837)  sodium chloride 0.9  % bolus 1,000 mL (0 mLs Intravenous Stopped 08/01/17 1032)  pantoprazole (PROTONIX) injection 40 mg (40 mg Intravenous Given 08/01/17 0837)  morphine 4 MG/ML injection 4 mg (4 mg Intravenous Given 08/01/17 1032)  iopamidol (ISOVUE-300) 61 % injection (100 mLs  Contrast Given 08/01/17 1101)  metoCLOPramide (REGLAN) injection 5 mg (5 mg Intravenous Given 08/01/17 1129)  diphenhydrAMINE (BENADRYL) injection 12.5 mg (12.5 mg Intravenous Given 08/01/17 1129)  potassium chloride 10 mEq in 100 mL IVPB (0 mEq Intravenous Stopped 08/01/17 1233)  pantoprazole (PROTONIX) injection 40 mg (40 mg Intravenous Given 08/01/17 1235)  ciprofloxacin (CIPRO) IVPB 400 mg (0 mg Intravenous Stopped 08/01/17 1712)     Initial Impression / Assessment and Plan / ED Course  I have reviewed the triage vital signs and the nursing notes.  Pertinent labs & imaging results that were available during my care of the  patient were reviewed by me and considered in my medical decision making (see chart for details).     76 year old female here with melena and epigastric pain.  She was initially hypertensive but has become increasingly hypotensive in the ED.  CT imaging is concerning for duodenal ulcer with possible fistula to the large bowel.  I discussed with GI.  Patient started on a Protonix drip as well as antibiotics.  GI has recommended discussion with surgery who has seen the patient.  Patient declines surgical intervention at this time so will continue medical management.  Of note, following 4 hours in the ED, hemoglobin noted to decrease to 6.0.  Will transfuse 3 units given her acute, ongoing blood loss with anemia.  Patient will need to be monitored in the ICU.  Final Clinical Impressions(s) / ED Diagnoses   Final diagnoses:  UGIB (upper gastrointestinal bleed)  Acute blood loss anemia    Clinical Impression: 1. UGIB (upper gastrointestinal bleed)   2. Acute blood loss anemia     Disposition: Admit  Prior to  providing a prescription for a controlled substance, I independently reviewed the patient's recent prescription history on the [place]. The patient had no recent or regular prescriptions and was deemed appropriate for a brief, less than 3 day prescription of narcotic for acute analgesia.  This note was prepared with assistance of Systems analyst. Occasional wrong-word or sound-a-like substitutions may have occurred due to the inherent limitations of voice recognition software.     Duffy Bruce, MD 08/01/17 [phone]

## 2017-08-01 NOTE — ED Notes (Signed)
Date and time results received: 08/01/17 12:40 PM  (use smartphrase ".now" to insert current time)  Test: Hgb Critical Value: 6.0  Name of Provider Notified: Issacs MD  Orders Received? Or Actions Taken?: Awaiting further orders

## 2017-08-01 NOTE — Progress Notes (Signed)
[facility] Progress Note Patient Name: Erica York DOB: 1940-09-18 MRN: [id number]   Date of Service  08/01/2017  HPI/Events of Note  hypomag  eICU Interventions  Mag replaced     Intervention Category Intermediate Interventions: Electrolyte abnormality - evaluation and management  DETERDING,ELIZABETH 08/01/2017, 7:53 PM

## 2017-08-01 NOTE — ED Notes (Signed)
Date and time results received: 08/01/17 12:59 PM    BP: 91/51  Name of Provider Notified: Dr. Myrene Buddy  Orders Received? Or Actions Taken?: Awaiting blood units

## 2017-08-01 NOTE — H&P (Signed)
PULMONARY / CRITICAL CARE MEDICINE   Name: Erica York MRN: [id number] DOB: [birth year]    ADMISSION DATE:  08/01/2017 CONSULTATION DATE: 12/14  REFERRING MD:  issacs  CHIEF COMPLAINT: Abdominal pain  HISTORY OF PRESENT ILLNESS:   76 year old female with a prior history of duodenal ulcers about 40 years ago.  Also has had history of diverticulitis status post prior partial colectomy as well as multiple hernia repairs.  Also use of NSAIDs for headache several times a week..  Most recent surgery about 6 years ago.  Doing well with the exception of diarrhea which is been chronic in nature.  Presents to emergency room with chief complaint of progressive sharp radiating abdominal pain this is been going on for about 2 weeks.  She has had some associated nonbloody vomiting episodes, also noting dark tarry stools.  The epigastric discomfort has been getting worse over the last 2 days so she presents to the emergency room for further evaluation.  Upon arrival hemoglobin noted to be around 6.  Mildly hypotensive with systolic blood pressure in the 80s.  A CT of abdomen was obtained raising concern for large duodenal ulcer and fistulization to the transverse colon.  She has been seen by both GI as well as surgical consultation has been requested.  She was asked by the critical care team to admit.  PAST MEDICAL HISTORY :  She  has a past medical history of Anxiety, Depression, and Hyperlipidemia.  PAST SURGICAL HISTORY: She  has a past surgical history that includes Abdominal hysterectomy; Appendectomy; Cholecystectomy; Bladder surgery; LEFT NEPHRECTOMY; and COLOSTOMY/REATTACHMENT.  Allergies  Allergen Reactions  . Zosyn [Piperacillin Sod-Tazobactam So]     No current facility-administered medications on file prior to encounter.    Current Outpatient Medications on File Prior to Encounter  Medication Sig  . ALPRAZolam (XANAX) 0.5 MG tablet Take 0.5 mg by mouth 3 (three) times daily as needed  for anxiety.  . colesevelam (WELCHOL) 625 MG tablet Take 625 mg by mouth daily.  . potassium chloride SA (K-DUR,KLOR-CON) 20 MEQ tablet Take 20 mEq by mouth 2 (two) times daily.  Marland Kitchen rOPINIRole (REQUIP) 1 MG tablet Take 0.5-1 mg by mouth at bedtime as needed (Sleep).  . sertraline (ZOLOFT) 100 MG tablet Take 100 mg by mouth at bedtime.    FAMILY HISTORY:  Her has no family status information on file.    SOCIAL HISTORY: She  reports that she has been smoking cigarettes.  she has never used smokeless tobacco. She reports that she does not drink alcohol or use drugs.  REVIEW OF SYSTEMS:   General: No fever, some chills.  No sick exposures. HEENT: Intermittent headaches for which she takes Excedrin for she takes at least 3-4 times a week.  Pulmonary: No shortness of breath, wheezing, cough cardiac: No chest pain no palpitations abdomen: Positive for abdominal pain this is become almost constant in nature and has been progressive over the last 2 weeks.  Has had primarily dark colored stools, has chronic diarrhea for the last 6 years after her last abdominal surgery.  The pain has been progressive.  She has had episodes intermittently of nausea and vomiting no blood in emesis.  GU no issues neuro no issues musculoskeletal no issues other than weakness endocrine no issues  SUBJECTIVE:  Feels okay, currently resting  VITAL SIGNS: BP 108/65 (BP Location: Left Arm)   Pulse 74   Temp 97.9 F (36.6 C) (Oral)   Resp 20   SpO2 100%   HEMODYNAMICS:  VENTILATOR SETTINGS:    INTAKE / OUTPUT: No intake/output data recorded.  PHYSICAL EXAMINATION: General: Frail 76 year old white female currently resting comfortably in bed no acute distress Neuro: Alert oriented no focal deficits HEENT: Normocephalic atraumatic no jugular venous distention Cardiovascular: Regular rate and rhythm without murmur rub or gallop Lungs: Clear to auscultation no accessory muscle use Abdomen: Soft, positive bowel  sounds.  Has small postsurgical hernia over upper epigastric region of abdomen.  Complains of left lower quadrant pain to palpation.  Has multiple postoperative scars Musculoskeletal: Warm and dry, equal strength and bulk Skin: No ulcerations  LABS:  BMET Recent Labs  Lab 08/01/17 0752  NA 138  K 4.6  CL 107  CO2 23  BUN 14  CREATININE 0.92  GLUCOSE 113*    Electrolytes Recent Labs  Lab 08/01/17 0752  CALCIUM 9.0    CBC Recent Labs  Lab 08/01/17 0752 08/01/17 1151  WBC 14.2* 9.1  HGB 7.7* 6.0*  HCT 26.3* 20.1*  PLT 398 251    Coag's No results for input(s): APTT, INR in the last 168 hours.  Sepsis Markers Recent Labs  Lab 08/01/17 0841  LATICACIDVEN 1.00    ABG No results for input(s): PHART, PCO2ART, PO2ART in the last 168 hours.  Liver Enzymes Recent Labs  Lab 08/01/17 0752  AST 16  ALT 9*  ALKPHOS 127*  BILITOT 0.2*  ALBUMIN 3.6    Cardiac Enzymes No results for input(s): TROPONINI, PROBNP in the last 168 hours.  Glucose No results for input(s): GLUCAP in the last 168 hours.  Imaging Ct Abdomen Pelvis W Contrast  Result Date: 08/01/2017 CLINICAL DATA:  Generalized abdominal pain, nausea, vomiting and diarrhea for 1 week with bloody stools. EXAM: CT ABDOMEN AND PELVIS WITH CONTRAST TECHNIQUE: Multidetector CT imaging of the abdomen and pelvis was performed using the standard protocol following bolus administration of intravenous contrast. CONTRAST:  87mL ISOVUE-300 IOPAMIDOL (ISOVUE-300) INJECTION 61% COMPARISON:  08/01/2017 abdominal radiographs . FINDINGS: Lower chest: Advanced emphysema at the lung bases. A few scattered small pulmonary nodules at both lung bases, largest 4 mm in the lingula (series 6/ image 5). Mild scarring versus atelectasis in the dependent lung bases. Coronary atherosclerosis. Hepatobiliary: Normal liver size. Granulomatous right liver dome calcification. No liver mass. Cholecystectomy. Mild diffuse intrahepatic biliary  ductal dilatation. Prominent common bile duct dilation (19 mm diameter, with smooth distal tapering of the common bile duct. No radiopaque choledocholithiasis. Pancreas: Mild diffuse main pancreatic duct dilation up to 5 mm diameter. No discrete pancreatic mass. Spleen: Normal size spleen. Hypodense 0.5 cm posterior splenic lesion is too small to characterize. Adrenals/Urinary Tract: Normal adrenals. Status post left nephrectomy with no mass or fluid collection in the left nephrectomy bed. Compensatory hypertrophy of the right kidney. Hypodense 1.2 cm renal cortical lesion in the medial lower right kidney (series 7/image 12) with density 22 HU. Subcentimeter hypodense renal cortical lesion in the lateral upper right kidney, too small to characterize. No right hydronephrosis. There is curvilinear calcification in the anterior superior midline bladder wall. Otherwise normal bladder. Stomach/Bowel: Normal nondistended stomach. There is circumferential wall thickening throughout the first and second portions of the duodenum with associated mucosal hyperenhancement and periduodenal fat stranding. There is a suggestion of abnormal fistulous communication of the duodenal bulb with the transverse colon (series 5/ image 52). There is mild wall thickening and mild dilatation throughout the transverse colon. Normal caliber small bowel. No additional sites of small bowel wall thickening. Appendix not discretely visualized. No pericecal  inflammatory changes . Postsurgical changes are noted from partial distal colectomy with intact appearing distal colonic anastomosis. Mild residual diverticulosis in the remnant left colon with no additional sites of large bowel wall thickening. Vascular/Lymphatic: Atherosclerotic abdominal aorta with right-sided 3.0 cm saccular infrarenal aortic aneurysm. Patent hepatic, portal, splenic and right renal veins. No pathologically enlarged lymph nodes in the abdomen or pelvis. Reproductive: Status  post hysterectomy, with no abnormal findings at the vaginal cuff. No adnexal mass. Other: No pneumoperitoneum, ascites or focal fluid collection. Evidence of prior ventral abdominal mesh repair with no evidence of a recurrent ventral abdominal hernia. Musculoskeletal: No aggressive appearing focal osseous lesions. Mild L1, mild to moderate L2, severe L4 and mild L5 vertebral compression fractures of uncertain chronicity. Marked lumbar spondylosis. Prominent levocurvature of the thoracolumbar spine. Diffuse osteopenia. IMPRESSION: 1. 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 No free air. No abscess. Recommend GI consultation for consideration of upper endoscopic correlation. 2. Prominent common bile duct dilation status post cholecystectomy (CBD diameter 19 mm). No radiopaque choledocholithiasis. Recommend correlation with liver function tests. 3. Aortic Atherosclerosis (ICD10-I70.0) and Emphysema (ICD10-J43.9). Coronary atherosclerosis. 4. Saccular 3.0 cm infrarenal abdominal aortic aneurysm. Recommend followup by ultrasound in 3 years. This recommendation follows ACR consensus guidelines: White Paper of the ACR Incidental Findings Committee II on Vascular Findings. J Am Coll Radiol 2013; 10:789-794. 5. Multilevel lumbar vertebral compression fractures of uncertain chronicity, probably chronic. 6. Scattered tiny pulmonary nodules at the lung bases, largest 4 mm. No follow-up needed if patient is low-risk (and has no known or suspected primary neoplasm). Non-contrast chest CT can be considered in 12 months if patient is high-risk. This recommendation follows the consensus statement: Guidelines for Management of Incidental Pulmonary Nodules Detected on CT Images: From the Fleischner Society 2017; Radiology 2017; 284:228-243. 7. Indeterminate small 1.2 cm low-attenuation renal cortical lesion in the medial lower right kidney, recommend evaluation  on a short term outpatient basis with MRI (preferred) or CT abdomen without and with IV contrast to exclude a renal neoplasm. Electronically Signed   By: Ilona Sorrel M.D.   On: 08/01/2017 11:56   Dg Abdomen Acute W/chest  Result Date: 08/01/2017 CLINICAL DATA:  Mid abdominal pain. EXAM: DG ABDOMEN ACUTE W/ 1V CHEST COMPARISON:  None. FINDINGS: There are dilated loops of bowel in the mid abdomen, containing gas fluid levels with maximum diameter of 4.4 cm. It is difficult to determine whether these represent small or large bowel loops. Extensive surgical changes in the abdomen and pelvis. Large area of punctate calcifications in the pelvis with uncertain etiology. Normal cardiac silhouette. Calcific atherosclerotic disease of the aorta. Upper lobe predominant emphysematous changes of the lungs. No evidence of focal consolidation, pleural effusion or pneumothorax. IMPRESSION: Nonspecific bowel gas pattern with dilated bowel loops containing gas fluid levels in the mid abdomen. It is difficult to determine whether these represent small or large bowel loops, given extensive postsurgical changes in the abdomen. If these represent small bowel loops, then this is evidence of small bowel ileus or early/ incomplete small bowel obstruction. Emphysematous changes of the lungs. Electronically Signed   By: Fidela Salisbury M.D.   On: 08/01/2017 10:41     STUDIES:  CT abdomen: Proximal duodenitis with CT findings concerning for duodenal bulb ulceration with fistulation to the transverse colon.  However no free air no abscess noted.  Has a prominent common bile duct dilation.  There is been a prior status  post cholecystectomy no evidence of choledocholithiasis.  Saccular 3.0 cm infrarenal abdominal aortic aneurysm  CULTURES: Blood cultures 12/14  ANTIBIOTICS: Cipro 12/14 Flagyl 12/14  SIGNIFICANT EVENTS:  LINES/TUBES:  DISCUSSION:   ASSESSMENT / PLAN:  abd pain w/ acute UGIB, possible duodedenal  bulb ulcer and associated Acute blood loss anemia -Does take NSAIDs for Migraines. Plan NPO Transfuse X 2 units PRBC Ck coags & LFTs No more NSAIDS PPI bolus, f/b 72 hr infusion  Empiric Cipro/flagyl start date 12/14 [facility] GI consulted. 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 No free air. No abscess.  So does not appear as though this is acute abdomen. ? Could the fistula be chronic as a result of prior surgery?? h/o multiple GI surgeries. Last surgery was about 6 years ago was her third attempt to correct abdominal hernias and required 12 hr procedure that reversed colostomy.  Plan NPO Await further input from GI  Radiographical evidence of common bile duct dilation. Has had prior cholecystectomy.  There is no evidence of choledocholithiasis Plan Checking LFTs  H/o tobacco abuse  Plan Smoking cessation  PRN BDs  H/o anxiety  Plan Holding xanax   Abdominal aortic aneurysm Plan Follow-up 3 years   FAMILY  - Updates:   - Inter-disciplinary family meet or Palliative Care meeting due by: 12/21  My cct 51 min  Erick Colace ACNP-BC [place] Pager # [phone] OR # [phone] if no answer   08/01/2017, 2:00 PM

## 2017-08-01 NOTE — Progress Notes (Signed)
Per dayshift RN, patient was to get 1u PRBC d/t Hgb of 6.0. Post Hgb 7.9. Reviewing orders for patient and see order for 2u PRBCs. Called Dr. Jimmy Footman to clarify. She stated not to give the 2nd unit of PRBC. Changed order to 1u PRBC per MD. Continue to monitor.

## 2017-08-02 DIAGNOSIS — K269 Duodenal ulcer, unspecified as acute or chronic, without hemorrhage or perforation: Secondary | ICD-10-CM

## 2017-08-02 LAB — COMPREHENSIVE METABOLIC PANEL
ALBUMIN: 2.7 g/dL — AB (ref 3.5–5.0)
ALT: 9 U/L — ABNORMAL LOW (ref 14–54)
ANION GAP: 5 (ref 5–15)
AST: 15 U/L (ref 15–41)
Alkaline Phosphatase: 104 U/L (ref 38–126)
BILIRUBIN TOTAL: 0.6 mg/dL (ref 0.3–1.2)
BUN: 7 mg/dL (ref 6–20)
CHLORIDE: 110 mmol/L (ref 101–111)
CO2: 22 mmol/L (ref 22–32)
Calcium: 7.9 mg/dL — ABNORMAL LOW (ref 8.9–10.3)
Creatinine, Ser: 0.79 mg/dL (ref 0.44–1.00)
GFR calc Af Amer: >60 mL/min (ref >60)
GFR calc non Af Amer: >60 mL/min (ref >60)
GLUCOSE: 104 mg/dL — AB (ref 65–99)
POTASSIUM: 4 mmol/L (ref 3.5–5.1)
SODIUM: 137 mmol/L (ref 135–145)
TOTAL PROTEIN: 5.5 g/dL — AB (ref 6.5–8.1)

## 2017-08-02 LAB — CBC
HCT: 25.2 % — ABNORMAL LOW (ref 36.0–46.0)
HEMATOCRIT: 24.4 % — AB (ref 36.0–46.0)
HEMATOCRIT: 25.8 % — AB (ref 36.0–46.0)
HEMOGLOBIN: 7.4 g/dL — AB (ref 12.0–15.0)
Hemoglobin: 7.6 g/dL — ABNORMAL LOW (ref 12.0–15.0)
Hemoglobin: 7.8 g/dL — ABNORMAL LOW (ref 12.0–15.0)
MCH: 23.7 pg — AB (ref 26.0–34.0)
MCH: 23.6 pg — AB (ref 26.0–34.0)
MCH: 23.6 pg — ABNORMAL LOW (ref 26.0–34.0)
MCHC: 30.3 g/dL (ref 30.0–36.0)
MCHC: 30.2 g/dL (ref 30.0–36.0)
MCHC: 30.2 g/dL (ref 30.0–36.0)
MCV: 78.2 fL (ref 78.0–100.0)
MCV: 78.3 fL (ref 78.0–100.0)
MCV: 78.2 fL (ref 78.0–100.0)
PLATELETS: 234 10*3/uL (ref 150–400)
PLATELETS: 239 10*3/uL (ref 150–400)
Platelets: 246 10*3/uL (ref 150–400)
RBC: 3.12 MIL/uL — ABNORMAL LOW (ref 3.87–5.11)
RBC: 3.22 MIL/uL — ABNORMAL LOW (ref 3.87–5.11)
RBC: 3.3 MIL/uL — ABNORMAL LOW (ref 3.87–5.11)
RDW: 17.3 % — AB (ref 11.5–15.5)
RDW: 17.4 % — AB (ref 11.5–15.5)
RDW: 17.6 % — AB (ref 11.5–15.5)
WBC: 6.3 10*3/uL (ref 4.0–10.5)
WBC: 5.8 10*3/uL (ref 4.0–10.5)
WBC: 7.2 10*3/uL (ref 4.0–10.5)

## 2017-08-02 MED ORDER — ACETAMINOPHEN 10 MG/ML IV SOLN
1000.0000 mg | Freq: Once | INTRAVENOUS | Status: AC
  Administered 2017-08-02: 1000 mg via INTRAVENOUS
  Filled 2017-08-02: qty 100

## 2017-08-02 MED ORDER — SODIUM CHLORIDE 0.9 % IV SOLN
INTRAVENOUS | Status: DC
  Administered 2017-08-02: 11:00:00 via INTRAVENOUS
  Administered 2017-08-03: 250 mL via INTRAVENOUS

## 2017-08-02 MED ORDER — POTASSIUM CHLORIDE 2 MEQ/ML IV SOLN: INTRAVENOUS | Status: DC

## 2017-08-02 MED ORDER — KCL IN DEXTROSE-NACL 20-5-0.45 MEQ/L-%-% IV SOLN
INTRAVENOUS | Status: DC
  Administered 2017-08-02 – 2017-08-03 (×2): 1000 mL via INTRAVENOUS
  Filled 2017-08-02 (×2): qty 1000

## 2017-08-02 MED ORDER — SODIUM CHLORIDE 0.9 % IV SOLN: 250.0000 mL | INTRAVENOUS | Status: DC

## 2017-08-02 NOTE — Progress Notes (Signed)
Subjective: The patient was seen and examined at bedside. She complains of pain in the upper abdomen which is 4-5 out of 10 in intensity. She has not had a bowel movement since yesterday. Denies nausea or vomiting. She received 1 unit of PRBC transfusion yesterday.  Objective: Vital signs in last 24 hours: Temp:  [97.8 F (36.6 C)-98.6 F (37 C)] 97.8 F (36.6 C) (12/15 0400) Pulse Rate:  [74-103] 78 (12/15 0600) Resp:  [13-20] 14 (12/15 0600) BP: (88-160)/(46-75) 96/56 (12/15 0600) SpO2:  [93 %-100 %] 96 % (12/15 0600) Weight:  [46.3 kg (102 lb 1.2 oz)] 46.3 kg (102 lb 1.2 oz) (12/14 2318) Weight change:     PE: Thinly built, frail-appearing GENERAL: Not in acute distress ABDOMEN: Mild pallor, no icterus EXTREMITIES: No deformity  Lab Results: Results for orders placed or performed during the hospital encounter of 08/01/17 (from the past 48 hour(s))  ABO/Rh     Status: None   Collection Time: 08/01/17  7:14 AM  Result Value Ref Range   ABO/RH(D) O POS   CBC with Differential     Status: Abnormal   Collection Time: 08/01/17  7:52 AM  Result Value Ref Range   WBC 14.2 (H) 4.0 - 10.5 K/uL   RBC 3.48 (L) 3.87 - 5.11 MIL/uL   Hemoglobin 7.7 (L) 12.0 - 15.0 g/dL   HCT 26.3 (L) 36.0 - 46.0 %   MCV 75.6 (L) 78.0 - 100.0 fL   MCH 22.1 (L) 26.0 - 34.0 pg   MCHC 29.3 (L) 30.0 - 36.0 g/dL   RDW 17.1 (H) 11.5 - 15.5 %   Platelets 398 150 - 400 K/uL   Neutrophils Relative % 89 %   Neutro Abs 12.5 (H) 1.7 - 7.7 K/uL   Lymphocytes Relative 4 %   Lymphs Abs 0.6 (L) 0.7 - 4.0 K/uL   Monocytes Relative 5 %   Monocytes Absolute 0.7 0.1 - 1.0 K/uL   Eosinophils Relative 2 %   Eosinophils Absolute 0.3 0.0 - 0.7 K/uL   Basophils Relative 0 %   Basophils Absolute 0.0 0.0 - 0.1 K/uL  Comprehensive metabolic panel     Status: Abnormal   Collection Time: 08/01/17  7:52 AM  Result Value Ref Range   Sodium 138 135 - 145 mmol/L   Potassium 4.6 3.5 - 5.1 mmol/L   Chloride 107 101 - 111  mmol/L   CO2 23 22 - 32 mmol/L   Glucose, Bld 113 (H) 65 - 99 mg/dL   BUN 14 6 - 20 mg/dL   Creatinine, Ser 0.92 0.44 - 1.00 mg/dL   Calcium 9.0 8.9 - 10.3 mg/dL   Total Protein 7.2 6.5 - 8.1 g/dL   Albumin 3.6 3.5 - 5.0 g/dL   AST 16 15 - 41 U/L   ALT 9 (L) 14 - 54 U/L   Alkaline Phosphatase 127 (H) 38 - 126 U/L   Total Bilirubin 0.2 (L) 0.3 - 1.2 mg/dL   GFR calc non Af Amer 60 (L) >60 mL/min   GFR calc Af Amer >60 >60 mL/min    Comment: (NOTE) The eGFR has been calculated using the CKD EPI equation. This calculation has not been validated in all clinical situations. eGFR's persistently <60 mL/min signify possible Chronic Kidney Disease.    Anion gap 8 5 - 15  Lipase, blood     Status: None   Collection Time: 08/01/17  7:52 AM  Result Value Ref Range   Lipase 23 11 - 51  U/L  Urinalysis, Routine w reflex microscopic     Status: Abnormal   Collection Time: 08/01/17  8:38 AM  Result Value Ref Range   Color, Urine STRAW (A) YELLOW   APPearance CLEAR CLEAR   Specific Gravity, Urine 1.011 1.005 - 1.030   pH 6.0 5.0 - 8.0   Glucose, UA NEGATIVE NEGATIVE mg/dL   Hgb urine dipstick NEGATIVE NEGATIVE   Bilirubin Urine NEGATIVE NEGATIVE   Ketones, ur NEGATIVE NEGATIVE mg/dL   Protein, ur NEGATIVE NEGATIVE mg/dL   Nitrite NEGATIVE NEGATIVE   Leukocytes, UA TRACE (A) NEGATIVE   RBC / HPF 0-5 0 - 5 RBC/hpf   WBC, UA 0-5 0 - 5 WBC/hpf   Bacteria, UA RARE (A) NONE SEEN   Squamous Epithelial / LPF 0-5 (A) NONE SEEN   Mucus PRESENT   I-Stat CG4 Lactic Acid, ED     Status: None   Collection Time: 08/01/17  8:41 AM  Result Value Ref Range   Lactic Acid, Venous 1.00 0.5 - 1.9 mmol/L  Type and screen [place]     Status: None (Preliminary result)   Collection Time: 08/01/17 10:30 AM  Result Value Ref Range   ABO/RH(D) O POS    Antibody Screen NEG    Sample Expiration 08/04/2017    Unit Number [id number]    Blood Component Type RED CELLS,LR    Unit division  00    Status of Unit ISSUED    Transfusion Status OK TO TRANSFUSE    Crossmatch Result Compatible    Unit Number [id number]    Blood Component Type RED CELLS,LR    Unit division 00    Status of Unit ALLOCATED    Transfusion Status OK TO TRANSFUSE    Crossmatch Result Compatible    Unit Number [id number]    Blood Component Type RED CELLS,LR    Unit division 00    Status of Unit ALLOCATED    Transfusion Status OK TO TRANSFUSE    Crossmatch Result Compatible   POC occult blood, ED     Status: Abnormal   Collection Time: 08/01/17 10:43 AM  Result Value Ref Range   Fecal Occult Bld POSITIVE (A) NEGATIVE  CBC     Status: Abnormal   Collection Time: 08/01/17 11:51 AM  Result Value Ref Range   WBC 9.1 4.0 - 10.5 K/uL   RBC 2.65 (L) 3.87 - 5.11 MIL/uL   Hemoglobin 6.0 (LL) 12.0 - 15.0 g/dL    Comment: REPEATED TO VERIFY CRITICAL RESULT CALLED TO, READ BACK BY AND VERIFIED WITH: CHENEY,A _0  BLACKM12/14/18    HCT 20.1 (L) 36.0 - 46.0 %   MCV 75.8 (L) 78.0 - 100.0 fL   MCH 22.6 (L) 26.0 - 34.0 pg   MCHC 29.9 (L) 30.0 - 36.0 g/dL   RDW 17.0 (H) 11.5 - 15.5 %   Platelets 251 150 - 400 K/uL  Prepare RBC     Status: None   Collection Time: 08/01/17  1:30 PM  Result Value Ref Range   Order Confirmation ORDER PROCESSED BY BLOOD BANK   MRSA PCR Screening     Status: None   Collection Time: 08/01/17  2:30 PM  Result Value Ref Range   MRSA by PCR NEGATIVE NEGATIVE    Comment:        The GeneXpert MRSA Assay (FDA approved for NASAL specimens only), is one component of a comprehensive MRSA colonization surveillance program. It is not intended to diagnose MRSA infection nor  to guide or monitor treatment for MRSA infections.   Magnesium     Status: Abnormal   Collection Time: 08/01/17  6:23 PM  Result Value Ref Range   Magnesium 1.5 (L) 1.7 - 2.4 mg/dL  Phosphorus     Status: None   Collection Time: 08/01/17  6:23 PM  Result Value Ref Range   Phosphorus 3.1 2.5 - 4.6  mg/dL  Protime-INR     Status: None   Collection Time: 08/01/17  6:23 PM  Result Value Ref Range   Prothrombin Time 12.9 11.4 - 15.2 seconds   INR 0.98   APTT     Status: None   Collection Time: 08/01/17  6:23 PM  Result Value Ref Range   aPTT 26 24 - 36 seconds  Hepatic function panel     Status: Abnormal   Collection Time: 08/01/17  6:23 PM  Result Value Ref Range   Total Protein 5.6 (L) 6.5 - 8.1 g/dL   Albumin 2.7 (L) 3.5 - 5.0 g/dL   AST 13 (L) 15 - 41 U/L   ALT 7 (L) 14 - 54 U/L   Alkaline Phosphatase 98 38 - 126 U/L   Total Bilirubin 0.6 0.3 - 1.2 mg/dL   Bilirubin, Direct <0.1 (L) 0.1 - 0.5 mg/dL   Indirect Bilirubin NOT CALCULATED 0.3 - 0.9 mg/dL  CBC     Status: Abnormal   Collection Time: 08/01/17  6:23 PM  Result Value Ref Range   WBC 6.9 4.0 - 10.5 K/uL   RBC 3.30 (L) 3.87 - 5.11 MIL/uL   Hemoglobin 7.9 (L) 12.0 - 15.0 g/dL    Comment: DELTA CHECK NOTED REPEATED TO VERIFY POST TRANSFUSION SPECIMEN    HCT 25.9 (L) 36.0 - 46.0 %   MCV 78.5 78.0 - 100.0 fL   MCH 23.9 (L) 26.0 - 34.0 pg   MCHC 30.5 30.0 - 36.0 g/dL   RDW 17.5 (H) 11.5 - 15.5 %   Platelets 253 150 - 400 K/uL  Comprehensive metabolic panel     Status: Abnormal   Collection Time: 08/02/17  2:41 AM  Result Value Ref Range   Sodium 137 135 - 145 mmol/L   Potassium 4.0 3.5 - 5.1 mmol/L   Chloride 110 101 - 111 mmol/L   CO2 22 22 - 32 mmol/L   Glucose, Bld 104 (H) 65 - 99 mg/dL   BUN 7 6 - 20 mg/dL   Creatinine, Ser 0.79 0.44 - 1.00 mg/dL   Calcium 7.9 (L) 8.9 - 10.3 mg/dL   Total Protein 5.5 (L) 6.5 - 8.1 g/dL   Albumin 2.7 (L) 3.5 - 5.0 g/dL   AST 15 15 - 41 U/L   ALT 9 (L) 14 - 54 U/L   Alkaline Phosphatase 104 38 - 126 U/L   Total Bilirubin 0.6 0.3 - 1.2 mg/dL   GFR calc non Af Amer >60 >60 mL/min   GFR calc Af Amer >60 >60 mL/min    Comment: (NOTE) The eGFR has been calculated using the CKD EPI equation. This calculation has not been validated in all clinical situations. eGFR's  persistently <60 mL/min signify possible Chronic Kidney Disease.    Anion gap 5 5 - 15  CBC     Status: Abnormal   Collection Time: 08/02/17  2:41 AM  Result Value Ref Range   WBC 6.3 4.0 - 10.5 K/uL   RBC 3.12 (L) 3.87 - 5.11 MIL/uL   Hemoglobin 7.4 (L) 12.0 - 15.0 g/dL  HCT 24.4 (L) 36.0 - 46.0 %   MCV 78.2 78.0 - 100.0 fL   MCH 23.7 (L) 26.0 - 34.0 pg   MCHC 30.3 30.0 - 36.0 g/dL   RDW 17.3 (H) 11.5 - 15.5 %   Platelets 246 150 - 400 K/uL    Studies/Results: Ct Abdomen Pelvis W Contrast  Result Date: 08/01/2017 CLINICAL DATA:  Generalized abdominal pain, nausea, vomiting and diarrhea for 1 week with bloody stools. EXAM: CT ABDOMEN AND PELVIS WITH CONTRAST TECHNIQUE: Multidetector CT imaging of the abdomen and pelvis was performed using the standard protocol following bolus administration of intravenous contrast. CONTRAST:  74m ISOVUE-300 IOPAMIDOL (ISOVUE-300) INJECTION 61% COMPARISON:  08/01/2017 abdominal radiographs . FINDINGS: Lower chest: Advanced emphysema at the lung bases. A few scattered small pulmonary nodules at both lung bases, largest 4 mm in the lingula (series 6/ image 5). Mild scarring versus atelectasis in the dependent lung bases. Coronary atherosclerosis. Hepatobiliary: Normal liver size. Granulomatous right liver dome calcification. No liver mass. Cholecystectomy. Mild diffuse intrahepatic biliary ductal dilatation. Prominent common bile duct dilation (19 mm diameter, with smooth distal tapering of the common bile duct. No radiopaque choledocholithiasis. Pancreas: Mild diffuse main pancreatic duct dilation up to 5 mm diameter. No discrete pancreatic mass. Spleen: Normal size spleen. Hypodense 0.5 cm posterior splenic lesion is too small to characterize. Adrenals/Urinary Tract: Normal adrenals. Status post left nephrectomy with no mass or fluid collection in the left nephrectomy bed. Compensatory hypertrophy of the right kidney. Hypodense 1.2 cm renal cortical lesion in  the medial lower right kidney (series 7/image 12) with density 22 HU. Subcentimeter hypodense renal cortical lesion in the lateral upper right kidney, too small to characterize. No right hydronephrosis. There is curvilinear calcification in the anterior superior midline bladder wall. Otherwise normal bladder. Stomach/Bowel: Normal nondistended stomach. There is circumferential wall thickening throughout the first and second portions of the duodenum with associated mucosal hyperenhancement and periduodenal fat stranding. There is a suggestion of abnormal fistulous communication of the duodenal bulb with the transverse colon (series 5/ image 52). There is mild wall thickening and mild dilatation throughout the transverse colon. Normal caliber small bowel. No additional sites of small bowel wall thickening. Appendix not discretely visualized. No pericecal inflammatory changes . Postsurgical changes are noted from partial distal colectomy with intact appearing distal colonic anastomosis. Mild residual diverticulosis in the remnant left colon with no additional sites of large bowel wall thickening. Vascular/Lymphatic: Atherosclerotic abdominal aorta with right-sided 3.0 cm saccular infrarenal aortic aneurysm. Patent hepatic, portal, splenic and right renal veins. No pathologically enlarged lymph nodes in the abdomen or pelvis. Reproductive: Status post hysterectomy, with no abnormal findings at the vaginal cuff. No adnexal mass. Other: No pneumoperitoneum, ascites or focal fluid collection. Evidence of prior ventral abdominal mesh repair with no evidence of a recurrent ventral abdominal hernia. Musculoskeletal: No aggressive appearing focal osseous lesions. Mild L1, mild to moderate L2, severe L4 and mild L5 vertebral compression fractures of uncertain chronicity. Marked lumbar spondylosis. Prominent levocurvature of the thoracolumbar spine. Diffuse osteopenia. IMPRESSION: 1. Proximal duodenitis with CT findings  concerning for fistulization of a duodenal bulb ulcer to the transverse colon. No free air. No abscess. Recommend GI consultation for consideration of upper endoscopic correlation. 2. Prominent common bile duct dilation status post cholecystectomy (CBD diameter 19 mm). No radiopaque choledocholithiasis. Recommend correlation with liver function tests. 3. Aortic Atherosclerosis (ICD10-I70.0) and Emphysema (ICD10-J43.9). Coronary atherosclerosis. 4. Saccular 3.0 cm infrarenal abdominal aortic aneurysm. Recommend followup by ultrasound in 3  years. This recommendation follows ACR consensus guidelines: White Paper of the ACR Incidental Findings Committee II on Vascular Findings. J Am Coll Radiol 2013; 10:789-794. 5. Multilevel lumbar vertebral compression fractures of uncertain chronicity, probably chronic. 6. Scattered tiny pulmonary nodules at the lung bases, largest 4 mm. No follow-up needed if patient is low-risk (and has no known or suspected primary neoplasm). Non-contrast chest CT can be considered in 12 months if patient is high-risk. This recommendation follows the consensus statement: Guidelines for Management of Incidental Pulmonary Nodules Detected on CT Images: From the Fleischner Society 2017; Radiology 2017; 284:228-243. 7. Indeterminate small 1.2 cm low-attenuation renal cortical lesion in the medial lower right kidney, recommend evaluation on a short term outpatient basis with MRI (preferred) or CT abdomen without and with IV contrast to exclude a renal neoplasm. Electronically Signed   By: Ilona Sorrel M.D.   On: 08/01/2017 11:56   Dg Abdomen Acute W/chest  Result Date: 08/01/2017 CLINICAL DATA:  Mid abdominal pain. EXAM: DG ABDOMEN ACUTE W/ 1V CHEST COMPARISON:  None. FINDINGS: There are dilated loops of bowel in the mid abdomen, containing gas fluid levels with maximum diameter of 4.4 cm. It is difficult to determine whether these represent small or large bowel loops. Extensive surgical changes  in the abdomen and pelvis. Large area of punctate calcifications in the pelvis with uncertain etiology. Normal cardiac silhouette. Calcific atherosclerotic disease of the aorta. Upper lobe predominant emphysematous changes of the lungs. No evidence of focal consolidation, pleural effusion or pneumothorax. IMPRESSION: Nonspecific bowel gas pattern with dilated bowel loops containing gas fluid levels in the mid abdomen. It is difficult to determine whether these represent small or large bowel loops, given extensive postsurgical changes in the abdomen. If these represent small bowel loops, then this is evidence of small bowel ileus or early/ incomplete small bowel obstruction. Emphysematous changes of the lungs. Electronically Signed   By: Fidela Salisbury M.D.   On: 08/01/2017 10:41    Medications: I have reviewed the patient's current medications.  Assessment: Abnormal fistulous communication of the duodenal bulb and transverse colon. Proximal duodenitis Thickening and dilatation throughout transverse colon. 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 Melena and bright red blood per rectum which has now resolved. Hemodynamically stable.  Recommend supportive care, continue IV Protonix, transfuse to keep hemoglobin above 7. No plans for endoscopy at this time as patient has an abnormal communication from duodenal bulb ulcer to transverse colon. Will defer advancement of diet as per surgical evaluation.   Ronnette Juniper 08/02/2017, 8:45 AM   Pager [phone] If no answer or after 5 PM call [phone]

## 2017-08-02 NOTE — Progress Notes (Signed)
PULMONARY / CRITICAL CARE MEDICINE   Name: Erica York MRN: [id number] DOB: [birth year]    ADMISSION DATE:  08/01/2017 CONSULTATION DATE:  08/01/2017  REFERRING MD:  Myrene Buddy  CHIEF COMPLAINT:  Abdominal pain  HISTORY OF PRESENT ILLNESS:   76 y/o female with a history of multiple abdominal surgeries who is still smoking presented to the ER with abdominal pain for several weeks.  She was found on CT imaging to have evidence of a duodenal ulcer.  SUBJECTIVE:  Feels better Some mild headache Some leg cramping  VITAL SIGNS: BP 140/66   Pulse 84   Temp 98 F (36.7 C) (Oral)   Resp 15   Wt 46.3 kg (102 lb 1.2 oz)   SpO2 97%   HEMODYNAMICS:    VENTILATOR SETTINGS:    INTAKE / OUTPUT: I/O last 3 completed shifts: In: 4022.5 [I.V.:1122.5; Blood:200; IV Piggyback:2700] Out: 1150 [Urine:1150]  PHYSICAL EXAMINATION:  General:  Frail, elderly female resting comfortably in bed HENT: NCAT OP clear PULM: CTA B, normal effort CV: RRR, no mgr GI: BS+, soft, nontender MSK: normal bulk and tone Neuro: awake, alert, no distress, MAEW  LABS:  BMET Recent Labs  Lab 08/01/17 0752 08/02/17 0241  NA 138 137  K 4.6 4.0  CL 107 110  CO2 23 22  BUN 14 7  CREATININE 0.92 0.79  GLUCOSE 113* 104*    Electrolytes Recent Labs  Lab 08/01/17 0752 08/01/17 1823 08/02/17 0241  CALCIUM 9.0  --  7.9*  MG  --  1.5*  --   PHOS  --  3.1  --     CBC Recent Labs  Lab 08/01/17 1823 08/02/17 0241 08/02/17 0849  WBC 6.9 6.3 5.8  HGB 7.9* 7.4* 7.6*  HCT 25.9* 24.4* 25.2*  PLT 253 246 234    Coag's Recent Labs  Lab 08/01/17 1823  APTT 26  INR 0.98    Sepsis Markers Recent Labs  Lab 08/01/17 0841  LATICACIDVEN 1.00    ABG No results for input(s): PHART, PCO2ART, PO2ART in the last 168 hours.  Liver Enzymes Recent Labs  Lab 08/01/17 0752 08/01/17 1823 08/02/17 0241  AST 16 13* 15  ALT 9* 7* 9*  ALKPHOS 127* 98 104  BILITOT 0.2* 0.6 0.6  ALBUMIN 3.6  2.7* 2.7*    Cardiac Enzymes No results for input(s): TROPONINI, PROBNP in the last 168 hours.  Glucose No results for input(s): GLUCAP in the last 168 hours.  Imaging No results found.   STUDIES:  CT abdomen: Proximal duodenitis with CT findings concerning for duodenal bulb ulceration with fistulation to the transverse colon.  However no free air no abscess noted.  Has a prominent common bile duct dilation.  There is been a prior status post cholecystectomy no evidence of choledocholithiasis.  Saccular 3.0 cm infrarenal abdominal aortic aneurysm  CULTURES: Blood cultures 12/14  ANTIBIOTICS: Cipro 12/14 Flagyl 12/14   SIGNIFICANT EVENTS:   LINES/TUBES:   DISCUSSION: 76 y/o female with Upper GI bleeding due to a duodenal ulcer.  Smoker, uses NSAIDs (excedrine).  ASSESSMENT / PLAN:  PULMONARY A: Smoker  P:   Counsel to quit   CARDIOVASCULAR A:  No acute issues P:  Tele Monitor hemodynamics  RENAL A:   No acute issues P:   Monitor BMET and UOP Replace electrolytes as needed   GASTROINTESTINAL A:   Upper GI bleed from duodenal ulcer P:   NPO PPI drip Decision on further work up for duodenal ulcer> communication with transverse colon?  Per GI medicine and surgery Probably needs Upper GI in 48 hours if stable  HEMATOLOGIC A:   Acute blood loss anemia, stable P:  Repeat CBC later tonight  INFECTIOUS A:   Concern for intestinal perforation, not septic P:   Continue antibitoics  ENDOCRINE A:   No acute issues   P:   Monitor glucose  NEUROLOGIC A:   Leg pain/cramping, says its better with potassium P:   Change fluids to D5 1/2NS with 20 KCL   FAMILY  - Updates: none bedside  - Inter-disciplinary family meet or Palliative Care meeting due by:  day 7  Will transfer to SDU status, TRH service  Roselie Awkward, MD [facility] Pager: [phone] Cell: [phone] After 3pm or if no response, call [phone]     08/02/2017, 1:26  PM

## 2017-08-02 NOTE — Progress Notes (Signed)
Subjective/Chief Complaint: No complaints. Feels better   Objective: Vital signs in last 24 hours: Temp:  [97.8 F (36.6 C)-98.6 F (37 C)] 97.8 F (36.6 C) (12/15 0400) Pulse Rate:  [74-103] 78 (12/15 0600) Resp:  [13-20] 14 (12/15 0600) BP: (88-160)/(46-75) 96/56 (12/15 0600) SpO2:  [93 %-100 %] 96 % (12/15 0600) Weight:  [46.3 kg (102 lb 1.2 oz)] 46.3 kg (102 lb 1.2 oz) (12/14 2318)    Intake/Output from previous day: 12/14 0701 - 12/15 0700 In: 4022.5 [I.V.:1122.5; Blood:200; IV Piggyback:2700] Out: 1150 [Urine:1150] Intake/Output this shift: Total I/O In: -  Out: 550 [Urine:550]  General appearance: alert and cooperative Resp: clear to auscultation bilaterally Cardio: regular rate and rhythm GI: soft, tender at reducible hernias.  Lab Results:  Recent Labs    08/01/17 1823 08/02/17 0241  WBC 6.9 6.3  HGB 7.9* 7.4*  HCT 25.9* 24.4*  PLT 253 246   BMET Recent Labs    08/01/17 0752 08/02/17 0241  NA 138 137  K 4.6 4.0  CL 107 110  CO2 23 22  GLUCOSE 113* 104*  BUN 14 7  CREATININE 0.92 0.79  CALCIUM 9.0 7.9*   PT/INR Recent Labs    08/01/17 1823  LABPROT 12.9  INR 0.98   ABG No results for input(s): PHART, HCO3 in the last 72 hours.  Invalid input(s): PCO2, PO2  Studies/Results: Ct Abdomen Pelvis W Contrast  Result Date: 08/01/2017 CLINICAL DATA:  Generalized abdominal pain, nausea, vomiting and diarrhea for 1 week with bloody stools. EXAM: CT ABDOMEN AND PELVIS WITH CONTRAST TECHNIQUE: Multidetector CT imaging of the abdomen and pelvis was performed using the standard protocol following bolus administration of intravenous contrast. CONTRAST:  11mL ISOVUE-300 IOPAMIDOL (ISOVUE-300) INJECTION 61% COMPARISON:  08/01/2017 abdominal radiographs . FINDINGS: Lower chest: Advanced emphysema at the lung bases. A few scattered small pulmonary nodules at both lung bases, largest 4 mm in the lingula (series 6/ image 5). Mild scarring versus  atelectasis in the dependent lung bases. Coronary atherosclerosis. Hepatobiliary: Normal liver size. Granulomatous right liver dome calcification. No liver mass. Cholecystectomy. Mild diffuse intrahepatic biliary ductal dilatation. Prominent common bile duct dilation (19 mm diameter, with smooth distal tapering of the common bile duct. No radiopaque choledocholithiasis. Pancreas: Mild diffuse main pancreatic duct dilation up to 5 mm diameter. No discrete pancreatic mass. Spleen: Normal size spleen. Hypodense 0.5 cm posterior splenic lesion is too small to characterize. Adrenals/Urinary Tract: Normal adrenals. Status post left nephrectomy with no mass or fluid collection in the left nephrectomy bed. Compensatory hypertrophy of the right kidney. Hypodense 1.2 cm renal cortical lesion in the medial lower right kidney (series 7/image 12) with density 22 HU. Subcentimeter hypodense renal cortical lesion in the lateral upper right kidney, too small to characterize. No right hydronephrosis. There is curvilinear calcification in the anterior superior midline bladder wall. Otherwise normal bladder. Stomach/Bowel: Normal nondistended stomach. There is circumferential wall thickening throughout the first and second portions of the duodenum with associated mucosal hyperenhancement and periduodenal fat stranding. There is a suggestion of abnormal fistulous communication of the duodenal bulb with the transverse colon (series 5/ image 52). There is mild wall thickening and mild dilatation throughout the transverse colon. Normal caliber small bowel. No additional sites of small bowel wall thickening. Appendix not discretely visualized. No pericecal inflammatory changes . Postsurgical changes are noted from partial distal colectomy with intact appearing distal colonic anastomosis. Mild residual diverticulosis in the remnant left colon with no additional sites of large bowel  wall thickening. Vascular/Lymphatic: Atherosclerotic  abdominal aorta with right-sided 3.0 cm saccular infrarenal aortic aneurysm. Patent hepatic, portal, splenic and right renal veins. No pathologically enlarged lymph nodes in the abdomen or pelvis. Reproductive: Status post hysterectomy, with no abnormal findings at the vaginal cuff. No adnexal mass. Other: No pneumoperitoneum, ascites or focal fluid collection. Evidence of prior ventral abdominal mesh repair with no evidence of a recurrent ventral abdominal hernia. Musculoskeletal: No aggressive appearing focal osseous lesions. Mild L1, mild to moderate L2, severe L4 and mild L5 vertebral compression fractures of uncertain chronicity. Marked lumbar spondylosis. Prominent levocurvature of the thoracolumbar spine. Diffuse osteopenia. IMPRESSION: 1. 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 No free air. No abscess. Recommend GI consultation for consideration of upper endoscopic correlation. 2. Prominent common bile duct dilation status post cholecystectomy (CBD diameter 19 mm). No radiopaque choledocholithiasis. Recommend correlation with liver function tests. 3. Aortic Atherosclerosis (ICD10-I70.0) and Emphysema (ICD10-J43.9). Coronary atherosclerosis. 4. Saccular 3.0 cm infrarenal abdominal aortic aneurysm. Recommend followup by ultrasound in 3 years. This recommendation follows ACR consensus guidelines: White Paper of the ACR Incidental Findings Committee II on Vascular Findings. J Am Coll Radiol 2013; 10:789-794. 5. Multilevel lumbar vertebral compression fractures of uncertain chronicity, probably chronic. 6. Scattered tiny pulmonary nodules at the lung bases, largest 4 mm. No follow-up needed if patient is low-risk (and has no known or suspected primary neoplasm). Non-contrast chest CT can be considered in 12 months if patient is high-risk. This recommendation follows the consensus statement: Guidelines for Management of Incidental  Pulmonary Nodules Detected on CT Images: From the Fleischner Society 2017; Radiology 2017; 284:228-243. 7. Indeterminate small 1.2 cm low-attenuation renal cortical lesion in the medial lower right kidney, recommend evaluation on a short term outpatient basis with MRI (preferred) or CT abdomen without and with IV contrast to exclude a renal neoplasm. Electronically Signed   By: Ilona Sorrel M.D.   On: 08/01/2017 11:56   Dg Abdomen Acute W/chest  Result Date: 08/01/2017 CLINICAL DATA:  Mid abdominal pain. EXAM: DG ABDOMEN ACUTE W/ 1V CHEST COMPARISON:  None. FINDINGS: There are dilated loops of bowel in the mid abdomen, containing gas fluid levels with maximum diameter of 4.4 cm. It is difficult to determine whether these represent small or large bowel loops. Extensive surgical changes in the abdomen and pelvis. Large area of punctate calcifications in the pelvis with uncertain etiology. Normal cardiac silhouette. Calcific atherosclerotic disease of the aorta. Upper lobe predominant emphysematous changes of the lungs. No evidence of focal consolidation, pleural effusion or pneumothorax. IMPRESSION: Nonspecific bowel gas pattern with dilated bowel loops containing gas fluid levels in the mid abdomen. It is difficult to determine whether these represent small or large bowel loops, given extensive postsurgical changes in the abdomen. If these represent small bowel loops, then this is evidence of small bowel ileus or early/ incomplete small bowel obstruction. Emphysematous changes of the lungs. Electronically Signed   By: Fidela Salisbury M.D.   On: 08/01/2017 10:41    Anti-infectives: Anti-infectives (From admission, onward)   Start     Dose/Rate Route Frequency Ordered Stop   08/01/17 1700  metroNIDAZOLE (FLAGYL) IVPB 500 mg     500 mg 100 mL/hr over 60 Minutes Intravenous Every 8 hours 08/01/17 1630     08/01/17 1415  ciprofloxacin (CIPRO) IVPB 400 mg     400 mg 200 mL/hr over 60 Minutes Intravenous   Once 08/01/17 1411 08/01/17 1712  08/01/17 1400  ciprofloxacin (CIPRO) IVPB 400 mg     400 mg 200 mL/hr over 60 Minutes Intravenous Every 12 hours 08/01/17 1403     08/01/17 1330  metroNIDAZOLE (FLAGYL) IVPB 500 mg  Status:  Discontinued     500 mg 100 mL/hr over 60 Minutes Intravenous  Once 08/01/17 1315 08/01/17 1821   08/01/17 1315  ceFEPIme (MAXIPIME) 2 g in dextrose 5 % 50 mL IVPB  Status:  Discontinued     2 g 100 mL/hr over 30 Minutes Intravenous  Once 08/01/17 1315 08/01/17 1403      Assessment/Plan: s/p * No surgery found * Continue bowel rest and PPI for possible duodenal ulcer  Monitor Hg If she has signs of bleeding would favor repeat endo  LOS: 1 day    TOTH III,Baudelio Karnes S 08/02/2017

## 2017-08-03 DIAGNOSIS — K2980 Duodenitis without bleeding: Secondary | ICD-10-CM

## 2017-08-03 DIAGNOSIS — D500 Iron deficiency anemia secondary to blood loss (chronic): Secondary | ICD-10-CM

## 2017-08-03 LAB — BASIC METABOLIC PANEL
Anion gap: 8 (ref 5–15)
BUN: 7 mg/dL (ref 6–20)
CHLORIDE: 108 mmol/L (ref 101–111)
CO2: 19 mmol/L — AB (ref 22–32)
CREATININE: 0.78 mg/dL (ref 0.44–1.00)
Calcium: 8 mg/dL — ABNORMAL LOW (ref 8.9–10.3)
GFR calc Af Amer: >60 mL/min (ref >60)
GFR calc non Af Amer: >60 mL/min (ref >60)
GLUCOSE: 82 mg/dL (ref 65–99)
Potassium: 3.7 mmol/L (ref 3.5–5.1)
Sodium: 135 mmol/L (ref 135–145)

## 2017-08-03 LAB — CBC
HEMATOCRIT: 25.2 % — AB (ref 36.0–46.0)
HEMOGLOBIN: 7.7 g/dL — AB (ref 12.0–15.0)
MCH: 23.8 pg — AB (ref 26.0–34.0)
MCHC: 30.6 g/dL (ref 30.0–36.0)
MCV: 77.8 fL — AB (ref 78.0–100.0)
Platelets: 243 10*3/uL (ref 150–400)
RBC: 3.24 MIL/uL — ABNORMAL LOW (ref 3.87–5.11)
RDW: 17.6 % — AB (ref 11.5–15.5)
WBC: 6.8 10*3/uL (ref 4.0–10.5)

## 2017-08-03 MED ORDER — LORAZEPAM 2 MG/ML IJ SOLN
0.5000 mg | Freq: Every evening | INTRAMUSCULAR | Status: DC | PRN
  Administered 2017-08-03: 0.5 mg via INTRAVENOUS
  Filled 2017-08-03: qty 1

## 2017-08-03 MED ORDER — SODIUM CHLORIDE 0.9 % IV SOLN
INTRAVENOUS | Status: AC
  Administered 2017-08-03: 1000 mL via INTRAVENOUS

## 2017-08-03 MED ORDER — LORAZEPAM 2 MG/ML IJ SOLN
0.5000 mg | Freq: Once | INTRAMUSCULAR | Status: AC
  Administered 2017-08-03: 0.5 mg via INTRAVENOUS
  Filled 2017-08-03: qty 1

## 2017-08-03 NOTE — Progress Notes (Signed)
[facility] Progress Note  Subjective: No complaints of abdominal pain today.  Objective: Vital signs in last 24 hours: Temp:  [97.1 F (36.2 C)-98.5 F (36.9 C)] 97.1 F (36.2 C) (12/16 0800) Pulse Rate:  [78-102] 85 (12/16 0900) Resp:  [10-18] 13 (12/16 0900) BP: (117-158)/(59-119) 125/76 (12/16 0900) SpO2:  [93 %-100 %] 98 % (12/16 0900) Weight:  [45.8 kg (100 lb 15.5 oz)] 45.8 kg (100 lb 15.5 oz) (12/16 0240) Weight change: -0.5 kg (-1.6 oz)   PE:  No distress  Heart regular rhythm  Abdomen soft and nontender  Lab Results: Results for orders placed or performed during the hospital encounter of 08/01/17 (from the past 24 hour(s))  CBC     Status: Abnormal   Collection Time: 08/02/17 10:38 PM  Result Value Ref Range   WBC 7.2 4.0 - 10.5 K/uL   RBC 3.30 (L) 3.87 - 5.11 MIL/uL   Hemoglobin 7.8 (L) 12.0 - 15.0 g/dL   HCT 25.8 (L) 36.0 - 46.0 %   MCV 78.2 78.0 - 100.0 fL   MCH 23.6 (L) 26.0 - 34.0 pg   MCHC 30.2 30.0 - 36.0 g/dL   RDW 17.6 (H) 11.5 - 15.5 %   Platelets 239 150 - 400 K/uL  CBC     Status: Abnormal   Collection Time: 08/03/17  3:23 AM  Result Value Ref Range   WBC 6.8 4.0 - 10.5 K/uL   RBC 3.24 (L) 3.87 - 5.11 MIL/uL   Hemoglobin 7.7 (L) 12.0 - 15.0 g/dL   HCT 25.2 (L) 36.0 - 46.0 %   MCV 77.8 (L) 78.0 - 100.0 fL   MCH 23.8 (L) 26.0 - 34.0 pg   MCHC 30.6 30.0 - 36.0 g/dL   RDW 17.6 (H) 11.5 - 15.5 %   Platelets 243 150 - 400 K/uL    Studies/Results: No results found.    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  Plan:   Continue PPI therapy. Gastrografin upper GI planned for tomorrow to further investigate.    Erica York 08/03/2017, 9:58 AM  Pager: [phone] If no answer or after 5 PM call [phone]

## 2017-08-03 NOTE — Progress Notes (Signed)
PROGRESS NOTE  Erica York VPX:[id number] DOB: 13-Jul-1941 DOA: 08/01/2017 PCP: System, Pcp Not In  HPI/Recap of past 24 hours:  Patient is drowsy, falling back to sleep during conversation, per RN patient received ativan at 7am for restless leg  Patient has not sleep last two nights due to restless leg by report  She denies pain, + flatus, no bm since in the hospital, no n/v, no fever. She remain npo   Assessment/Plan: Active Problems:   * No active hospital problems. *  Duodenitis:  Ppi/cipro/flagyl/npo/ivf/prn analgesic GI following  ? 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 Will need nutrition consult once able to eat  Restless leg  She reports requip did not really help She received ativan 0.5mg  iv which has helped. Will monitor  Code Status: full  Family Communication: patient   Disposition Plan: pending, remain in stepdown   Consultants:  Critical care admit, TRH pick up on 12/16  [facility]  General surgery  Procedures:  prbc transfusion x1 on 12/14  Antibiotics:  cipro/flagyl   Objective: BP (!) 151/69   Pulse 81   Temp 97.8 F (36.6 C) (Oral)   Resp 14   Ht 5\' 1"  (1.549 m)   Wt 45.8 kg (100 lb 15.5 oz)   SpO2 98%   BMI 19.08 kg/m   Intake/Output Summary (Last 24 hours) at 08/03/2017 0748 Last data filed at 08/03/2017 0500 Gross per 24 hour  Intake 3402.75 ml  Output 2700 ml  Net 702.75 ml   Filed Weights   08/01/17 2318 08/03/17 0000 08/03/17 0240  Weight: 46.3 kg (102 lb 1.2 oz) 45.8 kg (100 lb 15.5 oz) 45.8 kg (100 lb 15.5 oz)    Exam: Patient is examined daily including today on 08/03/2017,  exams remain the same as of yesterday except that has changed    General:  Drowsy, thin  Cardiovascular: RRR  Respiratory: diminished, no wheezing, no rales, no rhonchi  Abdomen: Soft/ND/NT, positive BS  Musculoskeletal: No Edema  Neuro: drowsy  Data Reviewed: Basic Metabolic Panel: Recent Labs  Lab 08/01/17 0752 08/01/17 1823 08/02/17 0241  NA 138  --  137  K 4.6  --  4.0  CL 107  --  110  CO2 23  --  22  GLUCOSE 113*  --  104*  BUN 14  --  7  CREATININE 0.92  --  0.79  CALCIUM 9.0  --  7.9*  MG  --  1.5*  --   PHOS  --  3.1  --    Liver Function Tests: Recent Labs  Lab 08/01/17 0752 08/01/17 1823 08/02/17 0241  AST 16 13* 15  ALT 9* 7* 9*  ALKPHOS 127* 98 104  BILITOT 0.2* 0.6 0.6  PROT 7.2 5.6* 5.5*  ALBUMIN 3.6 2.7* 2.7*   Recent Labs  Lab 08/01/17 0752  LIPASE 23   No results for input(s): AMMONIA in the last 168 hours. CBC: Recent Labs  Lab 08/01/17 0752  08/01/17 1823 08/02/17 0241 08/02/17 0849 08/02/17 2238 08/03/17 0323  WBC 14.2*   < > 6.9 6.3 5.8 7.2 6.8  NEUTROABS 12.5*  --   --   --   --   --   --  HGB 7.7*   < > 7.9* 7.4* 7.6* 7.8* 7.7*  HCT 26.3*   < > 25.9* 24.4* 25.2* 25.8* 25.2*  MCV 75.6*   < > 78.5 78.2 78.3 78.2 77.8*  PLT 398   < > 253 246 234 239 243   < > = values in this interval not displayed.   Cardiac Enzymes:   No results for input(s): CKTOTAL, CKMB, CKMBINDEX, TROPONINI in the last 168 hours. BNP (last 3 results) No results for input(s): BNP in the last 8760 hours.  ProBNP (last 3 results) No results for input(s): PROBNP in the last 8760 hours.  CBG: No results for input(s): GLUCAP in the last 168 hours.  Recent Results (from the past 240 hour(s))  MRSA PCR Screening     Status: None   Collection Time: 08/01/17  2:30 PM  Result Value Ref Range Status   MRSA by PCR NEGATIVE NEGATIVE Final    Comment:        The GeneXpert MRSA Assay (FDA approved for NASAL specimens only), is one component of  a comprehensive MRSA colonization surveillance program. It is not intended to diagnose MRSA infection nor to guide or monitor treatment for MRSA infections.   Culture, blood (routine x 2)     Status: None (Preliminary result)   Collection Time: 08/01/17  6:23 PM  Result Value Ref Range Status   Specimen Description BLOOD LEFT HAND  Final   Special Requests IN PEDIATRIC BOTTLE Blood Culture adequate volume  Final   Culture   Final    NO GROWTH < 24 HOURS Performed at [facility], [place] [address] [place], [place] [address]    Report Status PENDING  Incomplete  Culture, blood (routine x 2)     Status: None (Preliminary result)   Collection Time: 08/01/17  6:23 PM  Result Value Ref Range Status   Specimen Description BLOOD LEFT ANTECUBITAL  Final   Special Requests   Final    BOTTLES DRAWN AEROBIC ONLY Blood Culture adequate volume   Culture   Final    NO GROWTH < 24 HOURS Performed at [facility], [place] [address], [place], [place] [address]    Report Status PENDING  Incomplete     Studies: No results found.  Scheduled Meds: . [START ON 08/05/2017] pantoprazole  40 mg Intravenous Q12H    Continuous Infusions: . sodium chloride 10 mL/hr at 08/03/17 0542  . ciprofloxacin Stopped (08/03/17 0319)  . dextrose 5 % and 0.45 % NaCl with KCl 20 mEq/L 75 mL/hr at 08/03/17 0542  . metronidazole Stopped (08/03/17 0200)  . pantoprozole (PROTONIX) infusion 8 mg/hr (08/03/17 6270)     Time spent: 35 mins, case discussed with GI and general surgery  I have personally reviewed and interpreted on  08/03/2017 daily labs, tele strips, imagings as discussed above under date review session and assessment and plans.  I reviewed all nursing notes, pharmacy notes, consultant notes,  vitals, pertinent old records  I have discussed plan of care as described above with RN , patient and family on 08/03/2017   Florencia Reasons MD, PhD  [facility] Pager [phone]. If 7PM-7AM, please  contact night-coverage at [email or web address], password [email or web address] 08/03/2017, 7:48 AM  LOS: 2 days

## 2017-08-03 NOTE — Progress Notes (Signed)
Subjective/Chief Complaint: Feeling better today. Less tender   Objective: Vital signs in last 24 hours: Temp:  [97.1 F (36.2 C)-98.5 F (36.9 C)] 97.1 F (36.2 C) (12/16 0800) Pulse Rate:  [78-102] 85 (12/16 0900) Resp:  [10-18] 13 (12/16 0900) BP: (117-158)/(59-119) 125/76 (12/16 0900) SpO2:  [93 %-100 %] 98 % (12/16 0900) Weight:  [45.8 kg (100 lb 15.5 oz)] 45.8 kg (100 lb 15.5 oz) (12/16 0240)    Intake/Output from previous day: 12/15 0701 - 12/16 0700 In: 3402.8 [I.V.:2602.8; IV Piggyback:800] Out: 3000 [Urine:3000] Intake/Output this shift: Total I/O In: 378.8 [I.V.:378.8] Out: -   General appearance: alert and cooperative Resp: clear to auscultation bilaterally Cardio: regular rate and rhythm GI: soft, multiple reducible hernias. mild epigastric tenderness  Lab Results:  Recent Labs    08/02/17 2238 08/03/17 0323  WBC 7.2 6.8  HGB 7.8* 7.7*  HCT 25.8* 25.2*  PLT 239 243   BMET Recent Labs    08/01/17 0752 08/02/17 0241  NA 138 137  K 4.6 4.0  CL 107 110  CO2 23 22  GLUCOSE 113* 104*  BUN 14 7  CREATININE 0.92 0.79  CALCIUM 9.0 7.9*   PT/INR Recent Labs    08/01/17 1823  LABPROT 12.9  INR 0.98   ABG No results for input(s): PHART, HCO3 in the last 72 hours.  Invalid input(s): PCO2, PO2  Studies/Results: Ct Abdomen Pelvis W Contrast  Result Date: 08/01/2017 CLINICAL DATA:  Generalized abdominal pain, nausea, vomiting and diarrhea for 1 week with bloody stools. EXAM: CT ABDOMEN AND PELVIS WITH CONTRAST TECHNIQUE: Multidetector CT imaging of the abdomen and pelvis was performed using the standard protocol following bolus administration of intravenous contrast. CONTRAST:  89mL ISOVUE-300 IOPAMIDOL (ISOVUE-300) INJECTION 61% COMPARISON:  08/01/2017 abdominal radiographs . FINDINGS: Lower chest: Advanced emphysema at the lung bases. A few scattered small pulmonary nodules at both lung bases, largest 4 mm in the lingula (series 6/ image 5).  Mild scarring versus atelectasis in the dependent lung bases. Coronary atherosclerosis. Hepatobiliary: Normal liver size. Granulomatous right liver dome calcification. No liver mass. Cholecystectomy. Mild diffuse intrahepatic biliary ductal dilatation. Prominent common bile duct dilation (19 mm diameter, with smooth distal tapering of the common bile duct. No radiopaque choledocholithiasis. Pancreas: Mild diffuse main pancreatic duct dilation up to 5 mm diameter. No discrete pancreatic mass. Spleen: Normal size spleen. Hypodense 0.5 cm posterior splenic lesion is too small to characterize. Adrenals/Urinary Tract: Normal adrenals. Status post left nephrectomy with no mass or fluid collection in the left nephrectomy bed. Compensatory hypertrophy of the right kidney. Hypodense 1.2 cm renal cortical lesion in the medial lower right kidney (series 7/image 12) with density 22 HU. Subcentimeter hypodense renal cortical lesion in the lateral upper right kidney, too small to characterize. No right hydronephrosis. There is curvilinear calcification in the anterior superior midline bladder wall. Otherwise normal bladder. Stomach/Bowel: Normal nondistended stomach. There is circumferential wall thickening throughout the first and second portions of the duodenum with associated mucosal hyperenhancement and periduodenal fat stranding. There is a suggestion of abnormal fistulous communication of the duodenal bulb with the transverse colon (series 5/ image 52). There is mild wall thickening and mild dilatation throughout the transverse colon. Normal caliber small bowel. No additional sites of small bowel wall thickening. Appendix not discretely visualized. No pericecal inflammatory changes . Postsurgical changes are noted from partial distal colectomy with intact appearing distal colonic anastomosis. Mild residual diverticulosis in the remnant left colon with no additional sites of  large bowel wall thickening. Vascular/Lymphatic:  Atherosclerotic abdominal aorta with right-sided 3.0 cm saccular infrarenal aortic aneurysm. Patent hepatic, portal, splenic and right renal veins. No pathologically enlarged lymph nodes in the abdomen or pelvis. Reproductive: Status post hysterectomy, with no abnormal findings at the vaginal cuff. No adnexal mass. Other: No pneumoperitoneum, ascites or focal fluid collection. Evidence of prior ventral abdominal mesh repair with no evidence of a recurrent ventral abdominal hernia. Musculoskeletal: No aggressive appearing focal osseous lesions. Mild L1, mild to moderate L2, severe L4 and mild L5 vertebral compression fractures of uncertain chronicity. Marked lumbar spondylosis. Prominent levocurvature of the thoracolumbar spine. Diffuse osteopenia. IMPRESSION: 1. 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 No free air. No abscess. Recommend GI consultation for consideration of upper endoscopic correlation. 2. Prominent common bile duct dilation status post cholecystectomy (CBD diameter 19 mm). No radiopaque choledocholithiasis. Recommend correlation with liver function tests. 3. Aortic Atherosclerosis (ICD10-I70.0) and Emphysema (ICD10-J43.9). Coronary atherosclerosis. 4. Saccular 3.0 cm infrarenal abdominal aortic aneurysm. Recommend followup by ultrasound in 3 years. This recommendation follows ACR consensus guidelines: White Paper of the ACR Incidental Findings Committee II on Vascular Findings. J Am Coll Radiol 2013; 10:789-794. 5. Multilevel lumbar vertebral compression fractures of uncertain chronicity, probably chronic. 6. Scattered tiny pulmonary nodules at the lung bases, largest 4 mm. No follow-up needed if patient is low-risk (and has no known or suspected primary neoplasm). Non-contrast chest CT can be considered in 12 months if patient is high-risk. This recommendation follows the consensus statement: Guidelines for Management of  Incidental Pulmonary Nodules Detected on CT Images: From the Fleischner Society 2017; Radiology 2017; 284:228-243. 7. Indeterminate small 1.2 cm low-attenuation renal cortical lesion in the medial lower right kidney, recommend evaluation on a short term outpatient basis with MRI (preferred) or CT abdomen without and with IV contrast to exclude a renal neoplasm. Electronically Signed   By: Ilona Sorrel M.D.   On: 08/01/2017 11:56   Dg Abdomen Acute W/chest  Result Date: 08/01/2017 CLINICAL DATA:  Mid abdominal pain. EXAM: DG ABDOMEN ACUTE W/ 1V CHEST COMPARISON:  None. FINDINGS: There are dilated loops of bowel in the mid abdomen, containing gas fluid levels with maximum diameter of 4.4 cm. It is difficult to determine whether these represent small or large bowel loops. Extensive surgical changes in the abdomen and pelvis. Large area of punctate calcifications in the pelvis with uncertain etiology. Normal cardiac silhouette. Calcific atherosclerotic disease of the aorta. Upper lobe predominant emphysematous changes of the lungs. No evidence of focal consolidation, pleural effusion or pneumothorax. IMPRESSION: Nonspecific bowel gas pattern with dilated bowel loops containing gas fluid levels in the mid abdomen. It is difficult to determine whether these represent small or large bowel loops, given extensive postsurgical changes in the abdomen. If these represent small bowel loops, then this is evidence of small bowel ileus or early/ incomplete small bowel obstruction. Emphysematous changes of the lungs. Electronically Signed   By: Fidela Salisbury M.D.   On: 08/01/2017 10:41    Anti-infectives: Anti-infectives (From admission, onward)   Start     Dose/Rate Route Frequency Ordered Stop   08/01/17 1700  metroNIDAZOLE (FLAGYL) IVPB 500 mg     500 mg 100 mL/hr over 60 Minutes Intravenous Every 8 hours 08/01/17 1630     08/01/17 1415  ciprofloxacin (CIPRO) IVPB 400 mg     400 mg 200 mL/hr over 60 Minutes  Intravenous  Once 08/01/17 1411 08/01/17  1712   08/01/17 1400  ciprofloxacin (CIPRO) IVPB 400 mg     400 mg 200 mL/hr over 60 Minutes Intravenous Every 12 hours 08/01/17 1403     08/01/17 1330  metroNIDAZOLE (FLAGYL) IVPB 500 mg  Status:  Discontinued     500 mg 100 mL/hr over 60 Minutes Intravenous  Once 08/01/17 1315 08/01/17 1821   08/01/17 1315  ceFEPIme (MAXIPIME) 2 g in dextrose 5 % 50 mL IVPB  Status:  Discontinued     2 g 100 mL/hr over 30 Minutes Intravenous  Once 08/01/17 1315 08/01/17 1403      Assessment/Plan: s/p * No surgery found * Hg stable. 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 Plan for upper GI tomorrow Continue cipro and flagyl  LOS: 2 days    TOTH III,Lashauna Arpin S 08/03/2017

## 2017-08-04 ENCOUNTER — Inpatient Hospital Stay (HOSPITAL_COMMUNITY): Payer: Medicare (Managed Care)

## 2017-08-04 LAB — CBC WITH DIFFERENTIAL/PLATELET
Basophils Absolute: 0 10*3/uL (ref 0.0–0.1)
Basophils Relative: 0 %
EOS ABS: 0.3 10*3/uL (ref 0.0–0.7)
EOS PCT: 6 %
HCT: 26.1 % — ABNORMAL LOW (ref 36.0–46.0)
HEMOGLOBIN: 8 g/dL — AB (ref 12.0–15.0)
LYMPHS ABS: 1 10*3/uL (ref 0.7–4.0)
Lymphocytes Relative: 19 %
MCH: 24 pg — AB (ref 26.0–34.0)
MCHC: 30.7 g/dL (ref 30.0–36.0)
MCV: 78.1 fL (ref 78.0–100.0)
MONO ABS: 0.4 10*3/uL (ref 0.1–1.0)
MONOS PCT: 7 %
NEUTROS PCT: 68 %
Neutro Abs: 3.8 10*3/uL (ref 1.7–7.7)
Platelets: 250 10*3/uL (ref 150–400)
RBC: 3.34 MIL/uL — ABNORMAL LOW (ref 3.87–5.11)
RDW: 17.9 % — AB (ref 11.5–15.5)
WBC: 5.6 10*3/uL (ref 4.0–10.5)

## 2017-08-04 LAB — MAGNESIUM: MAGNESIUM: 1.6 mg/dL — AB (ref 1.7–2.4)

## 2017-08-04 LAB — BASIC METABOLIC PANEL
Anion gap: 8 (ref 5–15)
BUN: 6 mg/dL (ref 6–20)
CHLORIDE: 109 mmol/L (ref 101–111)
CO2: 22 mmol/L (ref 22–32)
CREATININE: 0.83 mg/dL (ref 0.44–1.00)
Calcium: 8.2 mg/dL — ABNORMAL LOW (ref 8.9–10.3)
GFR calc Af Amer: >60 mL/min (ref >60)
GFR calc non Af Amer: >60 mL/min (ref >60)
Glucose, Bld: 80 mg/dL (ref 65–99)
Potassium: 3.1 mmol/L — ABNORMAL LOW (ref 3.5–5.1)
SODIUM: 139 mmol/L (ref 135–145)

## 2017-08-04 LAB — LACTIC ACID, PLASMA: Lactic Acid, Venous: 0.6 mmol/L (ref 0.5–1.9)

## 2017-08-04 MED ORDER — IOPAMIDOL (ISOVUE-300) INJECTION 61%
100.0000 mL | Freq: Once | INTRAVENOUS | Status: AC | PRN
  Administered 2017-08-04: 100 mL via ORAL

## 2017-08-04 MED ORDER — ALPRAZOLAM 0.5 MG PO TABS
0.5000 mg | ORAL_TABLET | Freq: Three times a day (TID) | ORAL | Status: DC | PRN
  Administered 2017-08-04 – 2017-08-07 (×5): 0.5 mg via ORAL
  Filled 2017-08-04 (×5): qty 1

## 2017-08-04 MED ORDER — POTASSIUM CHLORIDE CRYS ER 20 MEQ PO TBCR
40.0000 meq | EXTENDED_RELEASE_TABLET | Freq: Once | ORAL | Status: AC
  Administered 2017-08-04: 40 meq via ORAL
  Filled 2017-08-04: qty 2

## 2017-08-04 MED ORDER — SERTRALINE HCL 100 MG PO TABS
100.0000 mg | ORAL_TABLET | Freq: Every day | ORAL | Status: DC
  Administered 2017-08-04 – 2017-08-07 (×4): 100 mg via ORAL
  Filled 2017-08-04 (×4): qty 1

## 2017-08-04 MED ORDER — METRONIDAZOLE 500 MG PO TABS
500.0000 mg | ORAL_TABLET | Freq: Three times a day (TID) | ORAL | Status: DC
  Administered 2017-08-04 – 2017-08-06 (×6): 500 mg via ORAL
  Filled 2017-08-04 (×6): qty 1

## 2017-08-04 MED ORDER — MAGNESIUM SULFATE 2 GM/50ML IV SOLN
2.0000 g | Freq: Once | INTRAVENOUS | Status: DC
  Filled 2017-08-04 (×2): qty 50

## 2017-08-04 MED ORDER — POTASSIUM CHLORIDE 10 MEQ/100ML IV SOLN
10.0000 meq | INTRAVENOUS | Status: AC
  Filled 2017-08-04: qty 100

## 2017-08-04 MED ORDER — ROPINIROLE HCL 1 MG PO TABS
0.5000 mg | ORAL_TABLET | Freq: Every evening | ORAL | Status: DC | PRN
  Administered 2017-08-04 – 2017-08-07 (×4): 1 mg via ORAL
  Filled 2017-08-04 (×4): qty 1

## 2017-08-04 MED ORDER — CIPROFLOXACIN HCL 500 MG PO TABS
500.0000 mg | ORAL_TABLET | Freq: Two times a day (BID) | ORAL | Status: DC
  Administered 2017-08-04 – 2017-08-06 (×4): 500 mg via ORAL
  Filled 2017-08-04 (×4): qty 1

## 2017-08-04 NOTE — Progress Notes (Signed)
PROGRESS NOTE  Erica York PNT:[id number] DOB: 05-05-1941 DOA: 08/01/2017 PCP: System, Pcp Not In  HPI/Recap of past 24 hours:  Patient is seen together with GI at bedside  She feels better today, report ab is "sore", no v/n, + brown stool, no fever She reports restless leg responded to ativan, not to requip  She remains npo   Assessment/Plan: Active Problems:   Duodenitis   Blood loss anemia  Duodenitis:  Ppi/cipro/flagyl/npo/ivf/prn analgesic egd to be planned by GI, diet advancement per GI GI following  ? Fistula between Duodenal  Bulb and transverse colon, thichening and dilatation throughout transverse colon on CT ab obtained initially on presentation Upper gi with gastrografin on 12/17"No fistula identified between duodenum and transverse colon"  likely does not need surgical intervention Will follow general surgery recommendation   Blood loss anemia hgb 6 on admission S/p prbc transfusion x1 on 12/14 hgb 8 today  Hypokalemia/hypomegnesemia: replace k/mag  COPD/smoker; no wheezing  H/o solitary kidney ,s/p left nephrectomy  malnutrition Body mass index is 19.16 kg/m. Will need nutrition consult once able to eat  Restless leg  She reports requip did not really help She received ativan 0.5mg  iv which has helped. Will monitor  Code Status: full  Family Communication: patient   Disposition Plan: transfer to med surg   Consultants:  Critical care admit, TRH pick up on 12/16  [facility]  General surgery  Procedures:  prbc transfusion x1 on 12/14  Antibiotics:  cipro/flagyl   Objective: BP 140/80   Pulse (!) 101   Temp 98.1 F (36.7 C) (Oral)   Resp (!) 23   Ht 5\' 1"  (1.549 m)   Wt 46 kg (101 lb 6.6 oz)   SpO2 94%   BMI 19.16 kg/m   Intake/Output Summary (Last 24 hours) at 08/04/2017 0753 Last data filed at 08/04/2017 0600 Gross per 24 hour  Intake 3253.75 ml  Output 1975 ml  Net 1278.75 ml   Filed Weights   08/03/17  0000 08/03/17 0240 08/04/17 0600  Weight: 45.8 kg (100 lb 15.5 oz) 45.8 kg (100 lb 15.5 oz) 46 kg (101 lb 6.6 oz)    Exam: Patient is examined daily including today on 08/04/2017, exams remain the same as of yesterday except that has changed    General:  NAD, thin  Cardiovascular: RRR  Respiratory: diminished, no wheezing, no rales, no rhonchi  Abdomen: Soft/ND/NT, positive BS  Musculoskeletal: No Edema  Neuro: aaox3, pleasant   Data Reviewed: Basic Metabolic Panel: Recent Labs  Lab 08/01/17 0752 08/01/17 1823 08/02/17 0241 08/03/17 0323 08/04/17 0307  NA 138  --  137 135 139  K 4.6  --  4.0 3.7 3.1*  CL 107  --  110 108 109  CO2 23  --  22 19* 22  GLUCOSE 113*  --  104* 82 80  BUN 14  --  7 7 6   CREATININE 0.92  --  0.79 0.78 0.83  CALCIUM 9.0  --  7.9* 8.0* 8.2*  MG  --  1.5*  --   --  1.6*  PHOS  --  3.1  --   --   --    Liver Function Tests: Recent Labs  Lab 08/01/17 0752 08/01/17 1823 08/02/17 0241  AST 16 13* 15  ALT 9* 7* 9*  ALKPHOS 127* 98 104  BILITOT 0.2* 0.6 0.6  PROT 7.2 5.6* 5.5*  ALBUMIN 3.6 2.7* 2.7*   Recent Labs  Lab 08/01/17 0752  LIPASE 23  No results for input(s): AMMONIA in the last 168 hours. CBC: Recent Labs  Lab 08/01/17 0752  08/02/17 0241 08/02/17 0849 08/02/17 2238 08/03/17 0323 08/04/17 0307  WBC 14.2*   < > 6.3 5.8 7.2 6.8 5.6  NEUTROABS 12.5*  --   --   --   --   --  3.8  HGB 7.7*   < > 7.4* 7.6* 7.8* 7.7* 8.0*  HCT 26.3*   < > 24.4* 25.2* 25.8* 25.2* 26.1*  MCV 75.6*   < > 78.2 78.3 78.2 77.8* 78.1  PLT 398   < > 246 234 239 243 250   < > = values in this interval not displayed.   Cardiac Enzymes:   No results for input(s): CKTOTAL, CKMB, CKMBINDEX, TROPONINI in the last 168 hours. BNP (last 3 results) No results for input(s): BNP in the last 8760 hours.  ProBNP (last 3 results) No results for input(s): PROBNP in the last 8760 hours.  CBG: No results for input(s): GLUCAP in the last 168  hours.  Recent Results (from the past 240 hour(s))  MRSA PCR Screening     Status: None   Collection Time: 08/01/17  2:30 PM  Result Value Ref Range Status   MRSA by PCR NEGATIVE NEGATIVE Final    Comment:        The GeneXpert MRSA Assay (FDA approved for NASAL specimens only), is one component of a comprehensive MRSA colonization surveillance program. It is not intended to diagnose MRSA infection nor to guide or monitor treatment for MRSA infections.   Culture, blood (routine x 2)     Status: None (Preliminary result)   Collection Time: 08/01/17  6:23 PM  Result Value Ref Range Status   Specimen Description BLOOD LEFT HAND  Final   Special Requests IN PEDIATRIC BOTTLE Blood Culture adequate volume  Final   Culture   Final    NO GROWTH 2 DAYS Performed at [facility], [place] [address] [place], [place] [address]    Report Status PENDING  Incomplete  Culture, blood (routine x 2)     Status: None (Preliminary result)   Collection Time: 08/01/17  6:23 PM  Result Value Ref Range Status   Specimen Description BLOOD LEFT ANTECUBITAL  Final   Special Requests   Final    BOTTLES DRAWN AEROBIC ONLY Blood Culture adequate volume   Culture   Final    NO GROWTH 2 DAYS Performed at [facility], [place] [address] [place], [place] [address]    Report Status PENDING  Incomplete     Studies: No results found.  Scheduled Meds: . [START ON 08/05/2017] pantoprazole  40 mg Intravenous Q12H    Continuous Infusions: . sodium chloride 10 mL/hr at 08/04/17 0517  . sodium chloride 75 mL/hr at 08/04/17 0517  . ciprofloxacin Stopped (08/04/17 0517)  . dextrose 5 % and 0.45 % NaCl with KCl 20 mEq/L Stopped (08/03/17 1318)  . magnesium sulfate 1 - 4 g bolus IVPB    . metronidazole Stopped (08/04/17 0325)  . pantoprozole (PROTONIX) infusion 8 mg/hr (08/04/17 0635)  . potassium chloride       Time spent: 35 mins, case discussed with GI  I have personally reviewed and  interpreted on  08/04/2017 daily labs, tele strips, imagings as discussed above under date review session and assessment and plans.  I reviewed all nursing notes, pharmacy notes, consultant notes,  vitals, pertinent old records  I have discussed plan of care  as described above with RN , patient on 08/04/2017   Florencia Reasons MD, PhD  [facility] Pager [phone]. If 7PM-7AM, please contact night-coverage at [email or web address], password [facility] 08/04/2017, 7:53 AM  LOS: 3 days

## 2017-08-04 NOTE — Evaluation (Signed)
Physical Therapy Evaluation Patient Details Name: Erica York MRN: [id number] DOB: [birth year] Today's Date: 08/04/2017   History of Present Illness  76 yo female admitted with duodenitis, anemia, UGIB. hx of kidney/bladder tumor, duodenal ulcer, hernia repair, RLS  Clinical Impression  On eval, pt was Min guard assist for mobility. She walked ~70 feet without an assistive device. Fluctuating O2 sat reading on monitor so unsure of actual O2 reading. Pt tolerated activity well. Do not anticipate any f/u PT needs. Will follow and progress activity as tolerated.     Follow Up Recommendations No PT follow up;Supervision - Intermittent    Equipment Recommendations  None recommended by PT    Recommendations for Other Services       Precautions / Restrictions Restrictions Weight Bearing Restrictions: No      Mobility  Bed Mobility Overal bed mobility: Modified Independent                Transfers Overall transfer level: Modified independent                  Ambulation/Gait Ambulation/Gait assistance: Min guard Ambulation Distance (Feet): 70 Feet Assistive device: None Gait Pattern/deviations: Step-through pattern;Drifts right/left;Staggering right;Staggering left     General Gait Details: close guard for safety. fluctuating O2 sat monitor reading so unsure of actual level.   Stairs            Wheelchair Mobility    Modified Rankin (Stroke Patients Only)       Balance                                             Pertinent Vitals/Pain Pain Assessment: No/denies pain    Home Living Family/patient expects to be discharged to:: Private residence Living Arrangements: Children(lives with son)             Home Equipment: None      Prior Function Level of Independence: Independent               Hand Dominance        Extremity/Trunk Assessment   Upper Extremity Assessment Upper Extremity Assessment: Overall  WFL for tasks assessed    Lower Extremity Assessment Lower Extremity Assessment: Generalized weakness    Cervical / Trunk Assessment Cervical / Trunk Assessment: Normal  Communication   Communication: No difficulties  Cognition Arousal/Alertness: Awake/alert Behavior During Therapy: WFL for tasks assessed/performed Overall Cognitive Status: Within Functional Limits for tasks assessed                                        General Comments      Exercises     Assessment/Plan    PT Assessment Patient needs continued PT services  PT Problem List Decreased mobility;Decreased activity tolerance       PT Treatment Interventions Gait training;Functional mobility training;Therapeutic activities;Therapeutic exercise    PT Goals (Current goals can be found in the Care Plan section)  Acute Rehab PT Goals Patient Stated Goal: home soon PT Goal Formulation: With patient Time For Goal Achievement: 08/18/17 Potential to Achieve Goals: Good    Frequency Min 3X/week   Barriers to discharge        Co-evaluation               AM-PAC  PT "6 Clicks" Daily Activity  Outcome Measure Difficulty turning over in bed (including adjusting bedclothes, sheets and blankets)?: None Difficulty moving from lying on back to sitting on the side of the bed? : None Difficulty sitting down on and standing up from a chair with arms (e.g., wheelchair, bedside commode, etc,.)?: None Help needed moving to and from a bed to chair (including a wheelchair)?: None Help needed walking in hospital room?: A Little Help needed climbing 3-5 steps with a railing? : A Little 6 Click Score: 22    End of Session   Activity Tolerance: Patient tolerated treatment well Patient left: in bed;with call bell/phone within reach;with bed alarm set   PT Visit Diagnosis: Difficulty in walking, not elsewhere classified (R26.2)    Time: 4825-0037 PT Time Calculation (min) (ACUTE ONLY): 10  min   Charges:   PT Evaluation $PT Eval Moderate Complexity: 1 Mod     PT G Codes:          Weston Anna, MPT Pager: [phone]

## 2017-08-04 NOTE — Progress Notes (Signed)
Patient seen and discussed with hospital team as well as my partner and she is doing much better and has a long history of ulcers years ago however without a recent problem and will await upper GI to help decide further workup and plans and whether we can advance her diet or not

## 2017-08-04 NOTE — Progress Notes (Signed)
CC: Abdominal pain  Subjective: In bed waiting for her upper GI.  Abdominal exam is stable.  She is still tender mid upper substernal/epigastric area.  Objective: Vital signs in last 24 hours: Temp:  [98.1 F (36.7 C)-98.6 F (37 C)] 98.1 F (36.7 C) (12/17 0402) Pulse Rate:  [81-103] 101 (12/17 0600) Resp:  [13-39] 23 (12/17 0600) BP: (106-166)/(54-100) 140/80 (12/17 0600) SpO2:  [91 %-100 %] 94 % (12/17 0600) Weight:  [46 kg (101 lb 6.6 oz)] 46 kg (101 lb 6.6 oz) (12/17 0600)   3253 IV 1975 urine BM x1 Afebrile vital signs are stable Potassium is 3.1.  Hemoglobin is 8, hematocrit is 26 CT 12/14:  . 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 No free air. No abscess.    Prominent common bile duct dilation status post cholecystectomy (CBD diameter 19 mm). No radiopaque choledocholithiasis.  3. Aortic Atherosclerosis, Emphysema, Coronary atherosclerosis. 4. Saccular 3.0 cm infrarenal abdominal aortic aneurysm. 5. Multilevel lumbar vertebral compression fractures of uncertain chronicity, probably chronic. 6. Scattered tiny pulmonary nodules at the lung bases, largest 4 mm. No follow-up needed if patient is low-risk (and has no known or suspected primary neoplasm).  Non-contrast chest CT can be considered  Intake/Output from previous day: 12/16 0701 - 12/17 0700 In: 3253.8 [I.V.:2753.8; IV Piggyback:500] Out: 1975 [NLGXQ:1194] Intake/Output this shift: No intake/output data recorded.  General appearance: alert, cooperative and no distress Resp: clear to auscultation bilaterally GI: Soft, tender mid epigastric substernal area.  No distention, few bowel sounds.  Lab Results:  Recent Labs    08/03/17 0323 08/04/17 0307  WBC 6.8 5.6  HGB 7.7* 8.0*  HCT 25.2* 26.1*  PLT 243 250    BMET Recent Labs    08/03/17 0323 08/04/17 0307  NA 135 139  K 3.7 3.1*  CL 108 109  CO2 19* 22  GLUCOSE  82 80  BUN 7 6  CREATININE 0.78 0.83  CALCIUM 8.0* 8.2*   PT/INR Recent Labs    08/01/17 1823  LABPROT 12.9  INR 0.98    Recent Labs  Lab 08/01/17 0752 08/01/17 1823 08/02/17 0241  AST 16 13* 15  ALT 9* 7* 9*  ALKPHOS 127* 98 104  BILITOT 0.2* 0.6 0.6  PROT 7.2 5.6* 5.5*  ALBUMIN 3.6 2.7* 2.7*     Lipase     Component Value Date/Time   LIPASE 23 08/01/2017 0752     Medications: . [START ON 08/05/2017] pantoprazole  40 mg Intravenous Q12H   . sodium chloride 10 mL/hr at 08/04/17 0517  . ciprofloxacin Stopped (08/04/17 0517)  . dextrose 5 % and 0.45 % NaCl with KCl 20 mEq/L Stopped (08/03/17 1318)  . magnesium sulfate 1 - 4 g bolus IVPB    . metronidazole Stopped (08/04/17 0325)  . pantoprozole (PROTONIX) infusion 8 mg/hr (08/04/17 0635)  . potassium chloride     Anti-infectives (From admission, onward)   Start     Dose/Rate Route Frequency Ordered Stop   08/01/17 1700  metroNIDAZOLE (FLAGYL) IVPB 500 mg     500 mg 100 mL/hr over 60 Minutes Intravenous Every 8 hours 08/01/17 1630     08/01/17 1415  ciprofloxacin (CIPRO) IVPB 400 mg     400 mg 200 mL/hr over 60 Minutes Intravenous  Once 08/01/17 1411 08/01/17 1712   08/01/17 1400  ciprofloxacin (CIPRO) IVPB 400 mg     400 mg 200 mL/hr over 60 Minutes  Intravenous Every 12 hours 08/01/17 1403     08/01/17 1330  metroNIDAZOLE (FLAGYL) IVPB 500 mg  Status:  Discontinued     500 mg 100 mL/hr over 60 Minutes Intravenous  Once 08/01/17 1315 08/01/17 1821   08/01/17 1315  ceFEPIme (MAXIPIME) 2 g in dextrose 5 % 50 mL IVPB  Status:  Discontinued     2 g 100 mL/hr over 30 Minutes Intravenous  Once 08/01/17 1315 08/01/17 1403      Assessment/Plan Upper GI bleed with duodenal ulcer.  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  FEN: IV fluids/n.p.o. ID: Maxipime 12/14 x 1 dose,  Cipro/Flagyl 12/14 - day 4 DVT: SCDs Foley: None Follow-up: To be determined    Plan: UGI is pending.  Will follow.  Even if she has a choledochoduodenal fistula I doubt any surgical treatment will be needed at this time.    LOS: 3 days    Erica York 08/04/2017 [phone]

## 2017-08-04 NOTE — Progress Notes (Signed)
PT Cancellation Note  Patient Details Name: Erica York MRN: [id number] DOB: 1941/01/31   Cancelled Treatment:    Reason Eval/Treat Not Completed: Patient at procedure or test/unavailable. Will check back as schedule allows. Thanks.    Weston Anna, MPT Pager: [phone]

## 2017-08-05 LAB — BPAM RBC
BLOOD PRODUCT EXPIRATION DATE: 201901122359
Blood Product Expiration Date: 201901112359
Blood Product Expiration Date: 201901112359
ISSUE DATE / TIME: 201812141332
UNIT TYPE AND RH: 5100
UNIT TYPE AND RH: 5100
UNIT TYPE AND RH: 5100

## 2017-08-05 LAB — CBC
HEMATOCRIT: 29.9 % — AB (ref 36.0–46.0)
HEMOGLOBIN: 9.1 g/dL — AB (ref 12.0–15.0)
MCH: 23.4 pg — AB (ref 26.0–34.0)
MCHC: 30.4 g/dL (ref 30.0–36.0)
MCV: 76.9 fL — AB (ref 78.0–100.0)
Platelets: 324 10*3/uL (ref 150–400)
RBC: 3.89 MIL/uL (ref 3.87–5.11)
RDW: 18.2 % — AB (ref 11.5–15.5)
WBC: 7.9 10*3/uL (ref 4.0–10.5)

## 2017-08-05 LAB — BASIC METABOLIC PANEL
Anion gap: 9 (ref 5–15)
BUN: 7 mg/dL (ref 6–20)
CALCIUM: 8.7 mg/dL — AB (ref 8.9–10.3)
CHLORIDE: 111 mmol/L (ref 101–111)
CO2: 18 mmol/L — AB (ref 22–32)
CREATININE: 0.8 mg/dL (ref 0.44–1.00)
GFR calc non Af Amer: >60 mL/min (ref >60)
GLUCOSE: 83 mg/dL (ref 65–99)
Potassium: 3.6 mmol/L (ref 3.5–5.1)
Sodium: 138 mmol/L (ref 135–145)

## 2017-08-05 LAB — TYPE AND SCREEN
ABO/RH(D): O POS
ANTIBODY SCREEN: NEGATIVE
Unit division: 0
Unit division: 0
Unit division: 0

## 2017-08-05 LAB — MAGNESIUM: Magnesium: 1.5 mg/dL — ABNORMAL LOW (ref 1.7–2.4)

## 2017-08-05 MED ORDER — MAGNESIUM SULFATE 2 GM/50ML IV SOLN
2.0000 g | Freq: Once | INTRAVENOUS | Status: AC
  Administered 2017-08-05: 2 g via INTRAVENOUS
  Filled 2017-08-05: qty 50

## 2017-08-05 MED ORDER — POTASSIUM CHLORIDE 20 MEQ/15ML (10%) PO SOLN
40.0000 meq | Freq: Once | ORAL | Status: AC
Start: 2017-08-05 — End: 2017-08-05
  Administered 2017-08-05: 40 meq via ORAL
  Filled 2017-08-05: qty 30

## 2017-08-05 NOTE — Progress Notes (Signed)
PROGRESS NOTE  Erica York VEL:[id number] DOB: Sep 30, 1940 DOA: 08/01/2017 PCP: System, Pcp Not In  HPI/Recap of past 24 hours:  She reports two different types of pain  One type of pain  is "ulcer pain" related to eating which started a few months ago.  it usually gets better. But the "ulcer pain" is continuous now.  Another type of pain is related to her mesh which is tender when she push on it.   She does not have vomiting since admitted to the hospital.  She reports having watery stool today, no blood in stool, no fever.      Assessment/Plan: Active Problems:   Duodenitis   Blood loss anemia  Duodenitis:  -Improving, continue ppi/cipro/flagyl, diet advancement per GI -Per GI Dr Watt Climes EGD planned on 12/19 : "she'll be allowed to have clear liquids until 8 AM and then nothing by mouth for endoscopy at 2 PM with further workup and plans pending those findings and probably will need an outpatient colonoscopy for her anemia after the holidays"   ? Fistula between Duodenal  Bulb and transverse colon, thichening and dilatation throughout transverse colon on CT ab obtained initially on presentation H/o multiple abdominal surgeries Upper gi with gastrografin on 12/17"No fistula identified between duodenum and transverse colon"  likely does not need surgical intervention Will follow general surgery recommendation   Blood loss anemia hgb 6 on admission, S/p prbc transfusion x1 on 12/14 hgb stable for the last few days  Hypokalemia/hypomegnesemia: replace k/mag  COPD/smoker; no wheezing, no hypoxia  H/o solitary kidney ,s/p left nephrectomy two yrs ago due to kidney cancer  Reports bladder cancer required bladder surgery and cancer removal x5 last one done in 05/[place], she need to follow up with a urologist here. She moved to [place] a months ago to be close to his son.  malnutrition Body mass index is 18.5 kg/m. Will need nutrition consult once able  to eat  Restless leg  She reports requip did not really help She received ativan 0.5mg  iv which has helped. Will monitor  Code Status: full  Family Communication: patient   Disposition Plan: home in 1-2 days , need [facility] and general surgery clearance   Consultants:  Critical care admit, TR[facility] pick up on 12/16  [facility]  General surgery  Procedures:  prbc transfusion x1 on 12/14  egd planned on 12/19  Antibiotics:  Cipro/flagyl from admission   Objective: BP 136/80 (BP Location: Left Arm)   Pulse 90   Temp 98.9 F (37.2 C) (Oral)   Resp 16   Ht 5\' 1"  (1.549 m)   Wt 44.4 kg (97 lb 14.4 oz)   SpO2 97%   BMI 18.50 kg/m   Intake/Output Summary (Last 24 hours) at 08/05/2017 1651 Last data filed at 08/05/2017 1400 Gross per 24 hour  Intake 815 ml  Output 4 ml  Net 811 ml   Filed Weights   08/03/17 0240 08/04/17 0600 08/05/17 0639  Weight: 45.8 kg (100 lb 15.5 oz) 46 kg (101 lb 6.6 oz) 44.4 kg (97 lb 14.4 oz)    Exam: Patient is examined daily including today on 08/05/2017, exams remain the same as of yesterday except that has changed    General:  NAD, thin  Cardiovascular: RRR  Respiratory: diminished, no wheezing, no rales, no rhonchi  Abdomen: mild epigastric tender, no guarding, no distension, positive BS, palpable mesh  Musculoskeletal: No Edema  Neuro: aaox3, pleasant   Data Reviewed: Basic  Metabolic Panel: Recent Labs  Lab 08/01/17 0752 08/01/17 1823 08/02/17 0241 08/03/17 0323 08/04/17 0307 08/05/17 0356  NA 138  --  137 135 139 138  K 4.6  --  4.0 3.7 3.1* 3.6  CL 107  --  110 108 109 111  CO2 23  --  22 19* 22 18*  GLUCOSE 113*  --  104* 82 80 83  BUN 14  --  7 7 6 7   CREATININE 0.92  --  0.79 0.78 0.83 0.80  CALCIUM 9.0  --  7.9* 8.0* 8.2* 8.7*  MG  --  1.5*  --   --  1.6* 1.5*  PHOS  --  3.1  --   --   --   --    Liver Function Tests: Recent Labs  Lab 08/01/17 0752 08/01/17 1823 08/02/17 0241  AST 16 13* 15  ALT  9* 7* 9*  ALKPHOS 127* 98 104  BILITOT 0.2* 0.6 0.6  PROT 7.2 5.6* 5.5*  ALBUMIN 3.6 2.7* 2.7*   Recent Labs  Lab 08/01/17 0752  LIPASE 23   No results for input(s): AMMONIA in the last 168 hours. CBC: Recent Labs  Lab 08/01/17 0752  08/02/17 0849 08/02/17 2238 08/03/17 0323 08/04/17 0307 08/05/17 0356  WBC 14.2*   < > 5.8 7.2 6.8 5.6 7.9  NEUTROABS 12.5*  --   --   --   --  3.8  --   HGB 7.7*   < > 7.6* 7.8* 7.7* 8.0* 9.1*  HCT 26.3*   < > 25.2* 25.8* 25.2* 26.1* 29.9*  MCV 75.6*   < > 78.3 78.2 77.8* 78.1 76.9*  PLT 398   < > 234 239 243 250 324   < > = values in this interval not displayed.   Cardiac Enzymes:   No results for input(s): CKTOTAL, CKMB, CKMBINDEX, TROPONINI in the last 168 hours. BNP (last 3 results) No results for input(s): BNP in the last 8760 hours.  ProBNP (last 3 results) No results for input(s): PROBNP in the last 8760 hours.  CBG: No results for input(s): GLUCAP in the last 168 hours.  Recent Results (from the past 240 hour(s))  MRSA PCR Screening     Status: None   Collection Time: 08/01/17  2:30 PM  Result Value Ref Range Status   MRSA by PCR NEGATIVE NEGATIVE Final    Comment:        The GeneXpert MRSA Assay (FDA approved for NASAL specimens only), is one component of a comprehensive MRSA colonization surveillance program. It is not intended to diagnose MRSA infection nor to guide or monitor treatment for MRSA infections.   Culture, blood (routine x 2)     Status: None (Preliminary result)   Collection Time: 08/01/17  6:23 PM  Result Value Ref Range Status   Specimen Description BLOOD LEFT HAND  Final   Special Requests IN PEDIATRIC BOTTLE Blood Culture adequate volume  Final   Culture   Final    NO GROWTH 4 DAYS Performed at [facility], [place] [address] [place], [place] [address]    Report Status PENDING  Incomplete  Culture, blood (routine x 2)     Status: None (Preliminary result)   Collection Time: 08/01/17  6:23  PM  Result Value Ref Range Status   Specimen Description BLOOD LEFT ANTECUBITAL  Final   Special Requests   Final    BOTTLES DRAWN AEROBIC ONLY Blood Culture adequate volume   Culture  Final    NO GROWTH 4 DAYS Performed at [facility], [place] [address], [place], [place] [address]    Report Status PENDING  Incomplete     Studies: No results found.  Scheduled Meds: . ciprofloxacin  500 mg Oral BID  . metroNIDAZOLE  500 mg Oral Q8H  . pantoprazole  40 mg Intravenous Q12H  . sertraline  100 mg Oral QHS    Continuous Infusions: . sodium chloride 10 mL/hr at 08/05/17 1400  . dextrose 5 % and 0.45 % NaCl with KCl 20 mEq/L Stopped (08/03/17 1318)  . magnesium sulfate 1 - 4 g bolus IVPB       Time spent: 35 mins, case discussed with GI  I have personally reviewed and interpreted on  08/05/2017 daily labs, tele strips, imagings as discussed above under date review session and assessment and plans.  I reviewed all nursing notes, pharmacy notes, consultant notes,  vitals, pertinent old records  I have discussed plan of care as described above with RN , patient on 08/05/2017   Florencia Reasons MD, PhD  [facility] Pager [phone]. If 7PM-7AM, please contact night-coverage at [email or web address], password [facility] 08/05/2017, 4:51 PM  LOS: 4 days

## 2017-08-05 NOTE — Care Management Important Message (Signed)
Important Message  Patient Details  Name: Erica York MRN: [id number] Date of Birth: [birth year]   Medicare Important Message Given:  Yes    Kerin Salen 08/05/2017, 10:08 AMImportant Message  Patient Details  Name: Erica York MRN: [id number] Date of Birth: 11/28/1940   Medicare Important Message Given:  Yes    Kerin Salen 08/05/2017, 10:08 AM

## 2017-08-05 NOTE — Progress Notes (Signed)
[name] 12:52 PM  Subjective: Patient complaining of some upper abdominal pain which seems like hernias or adhesional and is tolerating clear liquids and has no new complaints and she says her last colonoscopy was 3-4 years ago in [place] case discussed with hospital team and surgical team  Objective: Vital signs stable afebrile hernias are a little uncomfortable no guarding or rebound positive bowel sounds soft elsewhere labs okay Gastrografin upper GI okay without signs of fistula  Assessment: Abnormal CT scan anemia  Plan: Will proceed with an endoscopy tomorrow and  we discussed that procedure and she'll be allowed to have clear liquids until 8 AM and then nothing by mouth for endoscopy at 2 PM with further workup and plans pending those findings and probably will need an outpatient colonoscopy for her anemia after the holidays  [facility]  Pager [phone] After 5PM or if no answer call [phone]

## 2017-08-05 NOTE — Progress Notes (Signed)
    CC: Abdominal pain  Subjective: Complains of pain just below the sternum and under mesh that you can feel from her prior surgeries.  She says she has a lump that was not there before.  I am not seeing much on the CT from 12/14.  No cellulitis or hints of infection.     Objective: Vital signs in last 24 hours: Temp:  [98 F (36.7 C)-98.3 F (36.8 C)] 98.3 F (36.8 C) (12/18 0635) Pulse Rate:  [91-92] 92 (12/18 0635) Resp:  [16-18] 16 (12/18 0635) BP: (140-187)/(79-97) 140/87 (12/18 0635) SpO2:  [99 %-100 %] 100 % (12/18 0635) Weight:  [44.4 kg (97 lb 14.4 oz)] 44.4 kg (97 lb 14.4 oz) (12/18 0639) Last BM Date: 08/05/17 410 PO 353 stool 601 urine Afebrile vital signs are stable Hemoglobin stable BMP okay mag is low at 1.5   UGI yesterday:  No fistula identified between duodenum and transverse colon. Intake/Output from previous day: 12/17 0701 - 12/18 0700 In: 670.4 [P.O.:410; I.V.:260.4] Out: 954 [Urine:601; Stool:353] Intake/Output this shift: Total I/O In: 325 [P.O.:325] Out: -   General appearance: alert, cooperative and no distress GI: soft, remains tender just below sternum, mesh presient, small bulge, that she says comes and goes. This is painful to her.  Lab Results:  Recent Labs    08/04/17 0307 08/05/17 0356  WBC 5.6 7.9  HGB 8.0* 9.1*  HCT 26.1* 29.9*  PLT 250 324    BMET Recent Labs    08/04/17 0307 08/05/17 0356  NA 139 138  K 3.1* 3.6  CL 109 111  CO2 22 18*  GLUCOSE 80 83  BUN 6 7  CREATININE 0.83 0.80  CALCIUM 8.2* 8.7*   PT/INR No results for input(s): LABPROT, INR in the last 72 hours.  Recent Labs  Lab 08/01/17 0752 08/01/17 1823 08/02/17 0241  AST 16 13* 15  ALT 9* 7* 9*  ALKPHOS 127* 98 104  BILITOT 0.2* 0.6 0.6  PROT 7.2 5.6* 5.5*  ALBUMIN 3.6 2.7* 2.7*     Lipase     Component Value Date/Time   LIPASE 23 08/01/2017 0752     Medications: . ciprofloxacin  500 mg Oral BID  . metroNIDAZOLE  500 mg Oral Q8H   . pantoprazole  40 mg Intravenous Q12H  . sertraline  100 mg Oral QHS    Assessment/Plan Upper GI bleed with duodenal ulcer.  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  FEN: IV fluids/n.p.o. ID: Maxipime 12/14 x 1 dose, Cipro/Flagyl 12/14 - day 4 DVT: SCDs Foley: None Follow-up: To be determined  Plan:  I will ask Dr. Kae Heller to review again,but currently I do not see anything to warrant surgical intervention.          LOS: 4 days    Deatrice Spanbauer 08/05/2017 [phone]

## 2017-08-06 ENCOUNTER — Encounter (HOSPITAL_COMMUNITY)
Admission: EM | Disposition: A | Discharge status: Home / Self Care | Payer: Self-pay | Source: Home / Self Care | Attending: Internal Medicine

## 2017-08-06 ENCOUNTER — Encounter (HOSPITAL_COMMUNITY): Payer: Self-pay | Admitting: Emergency Medicine

## 2017-08-06 HISTORY — PX: ESOPHAGOGASTRODUODENOSCOPY: SHX5428

## 2017-08-06 LAB — BASIC METABOLIC PANEL
Anion gap: 5 (ref 5–15)
BUN: 6 mg/dL (ref 6–20)
CHLORIDE: 109 mmol/L (ref 101–111)
CO2: 22 mmol/L (ref 22–32)
Calcium: 8.3 mg/dL — ABNORMAL LOW (ref 8.9–10.3)
Creatinine, Ser: 0.79 mg/dL (ref 0.44–1.00)
GFR calc Af Amer: >60 mL/min (ref >60)
GFR calc non Af Amer: >60 mL/min (ref >60)
Glucose, Bld: 113 mg/dL — ABNORMAL HIGH (ref 65–99)
POTASSIUM: 3.6 mmol/L (ref 3.5–5.1)
SODIUM: 136 mmol/L (ref 135–145)

## 2017-08-06 LAB — CBC
HCT: 27.9 % — ABNORMAL LOW (ref 36.0–46.0)
HEMOGLOBIN: 8.5 g/dL — AB (ref 12.0–15.0)
MCH: 23.2 pg — AB (ref 26.0–34.0)
MCHC: 30.5 g/dL (ref 30.0–36.0)
MCV: 76.2 fL — ABNORMAL LOW (ref 78.0–100.0)
Platelets: 285 10*3/uL (ref 150–400)
RBC: 3.66 MIL/uL — AB (ref 3.87–5.11)
RDW: 18.1 % — ABNORMAL HIGH (ref 11.5–15.5)
WBC: 7.5 10*3/uL (ref 4.0–10.5)

## 2017-08-06 LAB — CULTURE, BLOOD (ROUTINE X 2)
CULTURE: NO GROWTH
CULTURE: NO GROWTH
Special Requests: ADEQUATE
Special Requests: ADEQUATE

## 2017-08-06 LAB — MAGNESIUM: MAGNESIUM: 2.1 mg/dL (ref 1.7–2.4)

## 2017-08-06 SURGERY — EGD (ESOPHAGOGASTRODUODENOSCOPY)
Anesthesia: Moderate Sedation

## 2017-08-06 MED ORDER — FENTANYL CITRATE (PF) 100 MCG/2ML IJ SOLN
INTRAMUSCULAR | Status: AC
  Filled 2017-08-06: qty 2

## 2017-08-06 MED ORDER — BUTAMBEN-TETRACAINE-BENZOCAINE 2-2-14 % EX AERO
INHALATION_SPRAY | CUTANEOUS | Status: DC | PRN
  Administered 2017-08-06: 1 via TOPICAL

## 2017-08-06 MED ORDER — DIPHENHYDRAMINE HCL 50 MG/ML IJ SOLN
INTRAMUSCULAR | Status: AC
  Filled 2017-08-06: qty 1

## 2017-08-06 MED ORDER — MIDAZOLAM HCL 5 MG/ML IJ SOLN
INTRAMUSCULAR | Status: AC
  Filled 2017-08-06: qty 2

## 2017-08-06 MED ORDER — FENTANYL CITRATE (PF) 100 MCG/2ML IJ SOLN
INTRAMUSCULAR | Status: DC | PRN
  Administered 2017-08-06: 25 ug via INTRAVENOUS
  Administered 2017-08-06 (×3): 12.5 ug via INTRAVENOUS

## 2017-08-06 MED ORDER — SODIUM CHLORIDE 0.9 % IV SOLN: INTRAVENOUS | Status: DC

## 2017-08-06 MED ORDER — MIDAZOLAM HCL 10 MG/2ML IJ SOLN
INTRAMUSCULAR | Status: DC | PRN
  Administered 2017-08-06 (×3): 1 mg via INTRAVENOUS
  Administered 2017-08-06: 2 mg via INTRAVENOUS

## 2017-08-06 NOTE — Op Note (Signed)
[facility] Patient Name: Erica York Procedure Date: 08/06/2017 MRN: [id number] Attending MD: Clarene Essex , MD Date of Birth: 09/19/1940 CSN: [id number] Age: 76 Admit Type: Inpatient Procedure:                Upper GI endoscopy Indications:              Epigastric abdominal pain, Abnormal CT of the GI                            tract, anemia Providers:                Clarene Essex, MD, Laverta Baltimore RN, RN, Cherylynn Ridges, Technician Referring MD:              Medicines:                Fentanyl 60 micrograms IV, Midazolam 6 mg IV Complications:            No immediate complications. Estimated Blood Loss:     Estimated blood loss: none. Procedure:                Pre-Anesthesia Assessment:                           - Prior to the procedure, a History and Physical                            was performed, and patient medications and                            allergies were reviewed. [name] tolerance of                            previous anesthesia was also reviewed. The risks                            and benefits of the procedure and the sedation                            options and risks were discussed with the patient.                            All questions were answered, and informed consent                            was obtained. Prior Anticoagulants: The patient has                            taken no previous anticoagulant or antiplatelet                            agents. ASA Grade Assessment: III - A patient with  severe systemic disease. After reviewing the risks                            and benefits, the patient was deemed in                            satisfactory condition to undergo the procedure.                           After obtaining informed consent, the endoscope was                            passed under direct vision. Throughout the                            procedure, the  patient's blood pressure, pulse, and                            oxygen saturations were monitored continuously. The                            Endoscope was introduced through the mouth, and                            advanced to the third part of duodenum. The upper                            GI endoscopy was accomplished without difficulty.                            The patient tolerated the procedure well. Scope In: Scope Out: Findings:      The larynx was normal.      A small hiatal hernia was present.      The entire examined stomach was normal. Biopsies were taken with a cold       forceps for histology of antrum and fundus to rule out H. pylori.      Few non-bleeding cratered duodenal ulcers with no stigmata of bleeding       were found in the duodenal bulb.      A large ulcerated mass with no bleeding was found in the first portion       of the duodenum. Biopsies were taken with a cold forceps for histology.      The second portion of the duodenum and third portion of the duodenum       were normal.      The exam was otherwise without abnormality. Impression:               - Normal larynx.                           - Small hiatal hernia.                           - Normal stomach. Biopsied antrum and fundus to  rule out H. pylori.                           - Multiple non-bleeding duodenal ulcers medium                            sized with no stigmata of bleeding.                           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                            Unfortunately no pictures were available due to                            malfunction in the picture mechanism difficult to                            advance the scope around may be partially                            obstructing                           - Normal second portion of the duodenum and third                            portion of the duodenum.                           - The  examination was otherwise normal. Moderate Sedation:      Moderate (conscious) sedation was administered by the endoscopy nurse       and supervised by the endoscopist. The following parameters were       monitored: oxygen saturation, heart rate, blood pressure, respiratory       rate, EKG, adequacy of pulmonary ventilation, and response to care. Recommendation:           - Patient has a contact number available for                            emergencies. The signs and symptoms of potential                            delayed complications were discussed with the                            patient. Return to normal activities tomorrow.                            Written discharge instructions were provided to the                            patient.                           - Full  liquid diet today. Await biopsies to                            determine if diet can be advanced since this may be                            partially obstructing and difficult to allow                            emptying                           - Continue present medications.                           - Await pathology results. Will send ASAP                           - Return to GI clinic PRN.                           - Telephone GI clinic if symptomatic PRN. Procedure Code(s):        --- Professional ---                           [phone], Esophagogastroduodenoscopy, flexible,                            transoral; with biopsy, single or multiple Diagnosis Code(s):        --- Professional ---                           K44.9, Diaphragmatic hernia without obstruction or                            gangrene                           K26.9, Duodenal ulcer, unspecified as acute or                            chronic, without hemorrhage or perforation                           K31.89, Other diseases of stomach and duodenum                           R10.13, Epigastric pain                           R93.3, Abnormal  findings on diagnostic imaging of                            other parts of digestive tract CPT copyright 2016 [employer]. All rights reserved. The codes documented in this report are preliminary and upon coder review may  be revised to meet current compliance  requirements. Clarene Essex, MD 08/06/2017 4:13:50 PM This report has been signed electronically. Number of Addenda: 0

## 2017-08-06 NOTE — Progress Notes (Signed)
PT Cancellation Note  Patient Details Name: Ramani Riva MRN: [id number] DOB: Mar 23, 1941   Cancelled Treatment:     pt requesting to rest before her procedure.  EGD at 2 pm today.  Will attempt another day.     Rica Koyanagi  PTA [facility]  Acute  Rehab Pager      [phone]

## 2017-08-06 NOTE — Progress Notes (Addendum)
PROGRESS NOTE                                                                                                                                                                                                             Patient Demographics:    Erica York, is a 76 y.o. female, DOB - 23-Jan-1941, QQI:[id number]  Admit date - 08/01/2017   Admitting Physician Juanito Doom, MD  Outpatient Primary MD for the patient is System, Pcp Not In  LOS - 5  Outpatient Specialists:  Chief Complaint  Patient presents with  . 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 She also had associated nonbloody vomiting and dark tarry stools. Epigastric pain started to get worse in the past 2 days and on presentation to the ED her hemoglobin was 6, hypotensive with systolic blood pressure in the 80s. CT of the abdomen concerning for large duodenal ulcer with fistulization to the transverse colon. Patient admitted to ICU and seen by GI and general surgery. Transferred to hospitalist service on 12/16.    Subjective:   Patient complains of some pain over her mid abdominal area around the hernial mesh.   Assessment  & Plan :   Principal problem Upper GI bleed with multiple nonbleeding duodenal ulcers H&H stable with transfusion. EGD today shows few nonbleeding cratered duodenal ulcer with no stigmata of bleeding in the duodenal bulb. Large ulcerated mass with no bleeding found in the first part of duodenum which were biopsied. Concern for malignancy. GI recommends full liquid diet today and recommend to await biopsies to  determine if that can be advanced given concern for partial obstruction. Monitor H&H closely. Will discontinue antibiotics (suspect receiving for duty nightly since 12/14).   ? Partial duodenal obstruction. Follow biopsy results to rule out malignancy.  COPD with tobacco use Currently stable.  Acute blood loss anemia Secondary to upper GI bleed/ duodenal ulcer. Stable after 1 unit PRBC transfusion on admission.  Hypokalemia/hypomagnesemia Replenish  Protein calorie malnutrition, unspecified Dietitian consult.  Restless leg syndrome Reported being  under grip test has not helped her symptoms. No active complaints at present. Monitor for now.  History of solitary kidney and question mild bladder cancer Patient reports having left nephrectomy 2 years ago and bladder surgery with cancer removal (last surgery in 05/2017).   Code Status : Full code  Family Communication  : None at bedside   Disposition Plan  : Patient moved to [place] recently from [place] to stay with her son. Does not have PCP in the community.  Barriers For Discharge : Pending duodenal biopsy results and if patient tolerates advance diet.  Consults  :   [facility]  Procedures  :  CT abdomen and pelvis next line EGD  DVT Prophylaxis  : SCDs  Lab Results  Component Value Date   PLT 285 08/06/2017    Antibiotics  :    Anti-infectives (From admission, onward)   Start     Dose/Rate Route Frequency Ordered Stop   08/04/17 2000  [MAR Hold]  ciprofloxacin (CIPRO) tablet 500 mg     (MAR Hold since 08/06/17 1358)   500 mg Oral 2 times daily 08/04/17 1516     08/04/17 1600  [MAR Hold]  metroNIDAZOLE (FLAGYL) tablet 500 mg     (MAR Hold since 08/06/17 1358)   500 mg Oral Every 8 hours 08/04/17 1516     08/01/17 1700  metroNIDAZOLE (FLAGYL) IVPB 500 mg  Status:  Discontinued     500 mg 100 mL/hr over 60 Minutes Intravenous Every 8 hours 08/01/17 1630 08/04/17 1516   08/01/17 1415  ciprofloxacin  (CIPRO) IVPB 400 mg     400 mg 200 mL/hr over 60 Minutes Intravenous  Once 08/01/17 1411 08/01/17 1712   08/01/17 1400  ciprofloxacin (CIPRO) IVPB 400 mg  Status:  Discontinued     400 mg 200 mL/hr over 60 Minutes Intravenous Every 12 hours 08/01/17 1403 08/04/17 1516   08/01/17 1330  metroNIDAZOLE (FLAGYL) IVPB 500 mg  Status:  Discontinued     500 mg 100 mL/hr over 60 Minutes Intravenous  Once 08/01/17 1315 08/01/17 1821   08/01/17 1315  ceFEPIme (MAXIPIME) 2 g in dextrose 5 % 50 mL IVPB  Status:  Discontinued     2 g 100 mL/hr over 30 Minutes Intravenous  Once 08/01/17 1315 08/01/17 1403        Objective:   Vitals:   08/06/17 1615 08/06/17 1616 08/06/17 1620 08/06/17 1628  BP: 110/76 110/76 122/88 132/76  Pulse: 85 87 89 79  Resp: 12 18 16 16   Temp:  97.9 F (36.6 C)    TempSrc:  Oral    SpO2: 94% 98% 93% 94%  Weight:      Height:        Wt Readings from Last 3 Encounters:  08/06/17 44.2 kg (97 lb 7.1 oz)     Intake/Output Summary (Last 24 hours) at 08/06/2017 1632 Last data filed at 08/06/2017 1000 Gross per 24 hour  Intake 910 ml  Output -  Net 910 ml     Physical Exam  Gen: not in distress HEENT:Pallor present,  moist mucosa, supple neck Chest: clear b/l, no added sounds CVS: N S1&S2, no murmurs, rubs or gallop GI: soft,nondistended, abdominal surgical scars, some tenderness on pressure over epigastric and supra umbilical area  Musculoskeletal: warm, no edema     Data Review:    CBC Recent Labs  Lab 08/01/17 0752  08/02/17 2238 08/03/17 0323 08/04/17 0307 08/05/17 0356 08/06/17 0442  WBC  14.2*   < > 7.2 6.8 5.6 7.9 7.5  HGB 7.7*   < > 7.8* 7.7* 8.0* 9.1* 8.5*  HCT 26.3*   < > 25.8* 25.2* 26.1* 29.9* 27.9*  PLT 398   < > 239 243 250 324 285  MCV 75.6*   < > 78.2 77.8* 78.1 76.9* 76.2*  MCH 22.1*   < > 23.6* 23.8* 24.0* 23.4* 23.2*  MCHC 29.3*   < > 30.2 30.6 30.7 30.4 30.5  RDW 17.1*   < > 17.6* 17.6* 17.9* 18.2* 18.1*  LYMPHSABS 0.6*   --   --   --  1.0  --   --   MONOABS 0.7  --   --   --  0.4  --   --   EOSABS 0.3  --   --   --  0.3  --   --   BASOSABS 0.0  --   --   --  0.0  --   --    < > = values in this interval not displayed.    Chemistries  Recent Labs  Lab 08/01/17 0752 08/01/17 1823 08/02/17 0241 08/03/17 0323 08/04/17 0307 08/05/17 0356 08/06/17 0442  NA 138  --  137 135 139 138 136  K 4.6  --  4.0 3.7 3.1* 3.6 3.6  CL 107  --  110 108 109 111 109  CO2 23  --  22 19* 22 18* 22  GLUCOSE 113*  --  104* 82 80 83 113*  BUN 14  --  7 7 6 7 6   CREATININE 0.92  --  0.79 0.78 0.83 0.80 0.79  CALCIUM 9.0  --  7.9* 8.0* 8.2* 8.7* 8.3*  MG  --  1.5*  --   --  1.6* 1.5* 2.1  AST 16 13* 15  --   --   --   --   ALT 9* 7* 9*  --   --   --   --   ALKPHOS 127* 98 104  --   --   --   --   BILITOT 0.2* 0.6 0.6  --   --   --   --    ------------------------------------------------------------------------------------------------------------------ No results for input(s): CHOL, HDL, LDLCALC, TRIG, CHOLHDL, LDLDIRECT in the last 72 hours.  No results found for: HGBA1C ------------------------------------------------------------------------------------------------------------------ No results for input(s): TSH, T4TOTAL, T3FREE, THYROIDAB in the last 72 hours.  Invalid input(s): FREET3 ------------------------------------------------------------------------------------------------------------------ No results for input(s): VITAMINB12, FOLATE, FERRITIN, TIBC, IRON, RETICCTPCT in the last 72 hours.  Coagulation profile Recent Labs  Lab 08/01/17 1823  INR 0.98    No results for input(s): DDIMER in the last 72 hours.  Cardiac Enzymes No results for input(s): CKMB, TROPONINI, MYOGLOBIN in the last 168 hours.  Invalid input(s): CK ------------------------------------------------------------------------------------------------------------------ No results found for: BNP  Inpatient Medications  Scheduled  Meds: . [MAR Hold] ciprofloxacin  500 mg Oral BID  . [MAR Hold] metroNIDAZOLE  500 mg Oral Q8H  . [MAR Hold] pantoprazole  40 mg Intravenous Q12H  . [MAR Hold] sertraline  100 mg Oral QHS   Continuous Infusions: . sodium chloride 75 mL/hr at 08/06/17 1000  . sodium chloride    . dextrose 5 % and 0.45 % NaCl with KCl 20 mEq/L Stopped (08/03/17 1318)  . [MAR Hold] magnesium sulfate 1 - 4 g bolus IVPB     PRN Meds:.[MAR Hold] ALPRAZolam, [MAR Hold] fentaNYL (SUBLIMAZE) injection, [MAR Hold] rOPINIRole  Micro Results Recent Results (from the past  240 hour(s))  MRSA PCR Screening     Status: None   Collection Time: 08/01/17  2:30 PM  Result Value Ref Range Status   MRSA by PCR NEGATIVE NEGATIVE Final    Comment:        The GeneXpert MRSA Assay (FDA approved for NASAL specimens only), is one component of a comprehensive MRSA colonization surveillance program. It is not intended to diagnose MRSA infection nor to guide or monitor treatment for MRSA infections.   Culture, blood (routine x 2)     Status: None   Collection Time: 08/01/17  6:23 PM  Result Value Ref Range Status   Specimen Description BLOOD LEFT HAND  Final   Special Requests IN PEDIATRIC BOTTLE Blood Culture adequate volume  Final   Culture   Final    NO GROWTH 5 DAYS Performed at [facility], [place] [address] [place], [place] [address]    Report Status 08/06/2017 FINAL  Final  Culture, blood (routine x 2)     Status: None   Collection Time: 08/01/17  6:23 PM  Result Value Ref Range Status   Specimen Description BLOOD LEFT ANTECUBITAL  Final   Special Requests   Final    BOTTLES DRAWN AEROBIC ONLY Blood Culture adequate volume   Culture   Final    NO GROWTH 5 DAYS Performed at [facility], [place] [address], [place], [place] [address]    Report Status 08/06/2017 FINAL  Final    Radiology Reports Ct Abdomen Pelvis W Contrast  Result Date: 08/01/2017 CLINICAL DATA:  Generalized abdominal pain,  nausea, vomiting and diarrhea for 1 week with bloody stools. EXAM: CT ABDOMEN AND PELVIS WITH CONTRAST TECHNIQUE: Multidetector CT imaging of the abdomen and pelvis was performed using the standard protocol following bolus administration of intravenous contrast. CONTRAST:  63mL ISOVUE-300 IOPAMIDOL (ISOVUE-300) INJECTION 61% COMPARISON:  08/01/2017 abdominal radiographs . FINDINGS: Lower chest: Advanced emphysema at the lung bases. A few scattered small pulmonary nodules at both lung bases, largest 4 mm in the lingula (series 6/ image 5). Mild scarring versus atelectasis in the dependent lung bases. Coronary atherosclerosis. Hepatobiliary: Normal liver size. Granulomatous right liver dome calcification. No liver mass. Cholecystectomy. Mild diffuse intrahepatic biliary ductal dilatation. Prominent common bile duct dilation (19 mm diameter, with smooth distal tapering of the common bile duct. No radiopaque choledocholithiasis. Pancreas: Mild diffuse main pancreatic duct dilation up to 5 mm diameter. No discrete pancreatic mass. Spleen: Normal size spleen. Hypodense 0.5 cm posterior splenic lesion is too small to characterize. Adrenals/Urinary Tract: Normal adrenals. Status post left nephrectomy with no mass or fluid collection in the left nephrectomy bed. Compensatory hypertrophy of the right kidney. Hypodense 1.2 cm renal cortical lesion in the medial lower right kidney (series 7/image 12) with density 22 HU. Subcentimeter hypodense renal cortical lesion in the lateral upper right kidney, too small to characterize. No right hydronephrosis. There is curvilinear calcification in the anterior superior midline bladder wall. Otherwise normal bladder. Stomach/Bowel: Normal nondistended stomach. There is circumferential wall thickening throughout the first and second portions of the duodenum with associated mucosal hyperenhancement and periduodenal fat stranding. There is a suggestion of abnormal fistulous communication of  the duodenal bulb with the transverse colon (series 5/ image 52). There is mild wall thickening and mild dilatation throughout the transverse colon. Normal caliber small bowel. No additional sites of small bowel wall thickening. Appendix not discretely visualized. No pericecal inflammatory changes . Postsurgical changes are noted from partial distal  colectomy with intact appearing distal colonic anastomosis. Mild residual diverticulosis in the remnant left colon with no additional sites of large bowel wall thickening. Vascular/Lymphatic: Atherosclerotic abdominal aorta with right-sided 3.0 cm saccular infrarenal aortic aneurysm. Patent hepatic, portal, splenic and right renal veins. No pathologically enlarged lymph nodes in the abdomen or pelvis. Reproductive: Status post hysterectomy, with no abnormal findings at the vaginal cuff. No adnexal mass. Other: No pneumoperitoneum, ascites or focal fluid collection. Evidence of prior ventral abdominal mesh repair with no evidence of a recurrent ventral abdominal hernia. Musculoskeletal: No aggressive appearing focal osseous lesions. Mild L1, mild to moderate L2, severe L4 and mild L5 vertebral compression fractures of uncertain chronicity. Marked lumbar spondylosis. Prominent levocurvature of the thoracolumbar spine. Diffuse osteopenia. IMPRESSION: 1. 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 No free air. No abscess. Recommend GI consultation for consideration of upper endoscopic correlation. 2. Prominent common bile duct dilation status post cholecystectomy (CBD diameter 19 mm). No radiopaque choledocholithiasis. Recommend correlation with liver function tests. 3. Aortic Atherosclerosis (ICD10-I70.0) and Emphysema (ICD10-J43.9). Coronary atherosclerosis. 4. Saccular 3.0 cm infrarenal abdominal aortic aneurysm. Recommend followup by ultrasound in 3 years. This recommendation follows ACR consensus  guidelines: White Paper of the ACR Incidental Findings Committee II on Vascular Findings. J Am Coll Radiol 2013; 10:789-794. 5. Multilevel lumbar vertebral compression fractures of uncertain chronicity, probably chronic. 6. Scattered tiny pulmonary nodules at the lung bases, largest 4 mm. No follow-up needed if patient is low-risk (and has no known or suspected primary neoplasm). Non-contrast chest CT can be considered in 12 months if patient is high-risk. This recommendation follows the consensus statement: Guidelines for Management of Incidental Pulmonary Nodules Detected on CT Images: From the Fleischner Society 2017; Radiology 2017; 284:228-243. 7. Indeterminate small 1.2 cm low-attenuation renal cortical lesion in the medial lower right kidney, recommend evaluation on a short term outpatient basis with MRI (preferred) or CT abdomen without and with IV contrast to exclude a renal neoplasm. Electronically Signed   By: Ilona Sorrel M.D.   On: 08/01/2017 11:56   Dg Abdomen Acute W/chest  Result Date: 08/01/2017 CLINICAL DATA:  Mid abdominal pain. EXAM: DG ABDOMEN ACUTE W/ 1V CHEST COMPARISON:  None. FINDINGS: There are dilated loops of bowel in the mid abdomen, containing gas fluid levels with maximum diameter of 4.4 cm. It is difficult to determine whether these represent small or large bowel loops. Extensive surgical changes in the abdomen and pelvis. Large area of punctate calcifications in the pelvis with uncertain etiology. Normal cardiac silhouette. Calcific atherosclerotic disease of the aorta. Upper lobe predominant emphysematous changes of the lungs. No evidence of focal consolidation, pleural effusion or pneumothorax. IMPRESSION: Nonspecific bowel gas pattern with dilated bowel loops containing gas fluid levels in the mid abdomen. It is difficult to determine whether these represent small or large bowel loops, given extensive postsurgical changes in the abdomen. If these represent small bowel loops,  then this is evidence of small bowel ileus or early/ incomplete small bowel obstruction. Emphysematous changes of the lungs. Electronically Signed   By: Fidela Salisbury M.D.   On: 08/01/2017 10:41   Dg Duanne Limerick W/water Sol Cm  Result Date: 08/04/2017 CLINICAL DATA:  76 year old female post multiple surgeries with duodenitis and question of fistula from duodenal bulb to transverse colon. Subsequent encounter. EXAM: WATER SOLUBLE UPPER GI SERIES TECHNIQUE: Single-column upper GI series was performed using water soluble contrast. COMPARISON:  08/01/2017 CT. FLUOROSCOPY TIME:  Fluoroscopy Time:  2 minutes and 24 seconds Radiation Exposure Index:  32.8 mGy FINDINGS: Exam was tailored specifically to evaluate for possible fistula as question on recent CT. Scout view reveals scoliosis thoracic lumbar spine convex left. Multiple surgical clips and staples in place. Vascular calcifications. No fistula noted between duodenum and transverse colon. Remainder of exam limited by technique. No focal gastric or duodenal ulcer noted. IMPRESSION: No fistula identified between duodenum and transverse colon. Aortic Atherosclerosis (ICD10-I70.0). Electronically Signed   By: Genia Del M.D.   On: 08/04/2017 12:30    Time Spent in minutes  25   Murle Hellstrom M.D on 08/06/2017 at 4:32 PM  Between 7am to 7pm - Pager - [phone]  After 7pm go to [email or web address] - password [facility]  [facility] -  Office  [phone]

## 2017-08-06 NOTE — Progress Notes (Signed)
Spoke with patient at bedside regarding PCP issues. She states she has a PCP practice she will go to but is having issues with insurance changing from [place] to [place]. She has completed the necessary paperwork. She is supposed to get verification in the mail shortly and will take to the office to complete the process. She states she has enrolled with the practice already and is waiting on this last piece. She cannot recall the name of the practice, states she had not been assigned to a particular physician yet. [phone]

## 2017-08-06 NOTE — Progress Notes (Signed)
[name] 2:15 PM  Subjective: Patient doing better today without any new complaints  Objective: Vital signs stable afebrile no acute distress exam please see preassessment evaluation labs stable  Assessment: Abnormal CT abdominal pain  Plan: Okay to proceed with endoscopy with further workup and plans pending those findings and I discussed outpatient colonoscopy with her next year and she is agreeable at this time  [place] E  Pager [phone] After 5PM or if no answer call [phone]

## 2017-08-07 DIAGNOSIS — E43 Unspecified severe protein-calorie malnutrition: Secondary | ICD-10-CM

## 2017-08-07 LAB — CBC
HCT: 29.4 % — ABNORMAL LOW (ref 36.0–46.0)
Hemoglobin: 9.1 g/dL — ABNORMAL LOW (ref 12.0–15.0)
MCH: 23.5 pg — AB (ref 26.0–34.0)
MCHC: 31 g/dL (ref 30.0–36.0)
MCV: 75.8 fL — AB (ref 78.0–100.0)
PLATELETS: 255 10*3/uL (ref 150–400)
RBC: 3.88 MIL/uL (ref 3.87–5.11)
RDW: 18.2 % — AB (ref 11.5–15.5)
WBC: 6.3 10*3/uL (ref 4.0–10.5)

## 2017-08-07 MED ORDER — PANTOPRAZOLE SODIUM 40 MG PO TBEC
40.0000 mg | DELAYED_RELEASE_TABLET | Freq: Two times a day (BID) | ORAL | Status: DC
  Administered 2017-08-07 – 2017-08-08 (×2): 40 mg via ORAL
  Filled 2017-08-07 (×2): qty 1

## 2017-08-07 MED ORDER — BOOST / RESOURCE BREEZE PO LIQD CUSTOM
1.0000 | Freq: Once | ORAL | Status: AC
  Administered 2017-08-07: 1 via ORAL

## 2017-08-07 NOTE — Progress Notes (Addendum)
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 She also had associated nonbloody vomiting and dark tarry stools.   Patient in room reporting she doesn't feel well. Pt states she ate cream of chicken, pudding and yogurt for breakfast this morning. States she feels ready for solid food. Pt states she forgot to order lunch so RD placed order for her. Diet now is dysphagia 3.  Pt does not like Ensure or Boost supplements, states she somehow cannot swallow them d/t the taste. She is willing to try Boost Breeze.   Pt with history of bladder and renal cancer. She is s/p left nephrectomy ~2 years ago and just recently had bladder surgery in October 2018.  Per chart, pt has lost 8 lb since admission on 12/14 (6 days).   Medications: IV Protonix every 12 hours Labs reviewed: Mg WNL -was low at admission   NUTRITION - FOCUSED PHYSICAL EXAM:    Most Recent Value  Orbital Region  No depletion   Upper Arm Region  Severe depletion  Thoracic and Lumbar Region  Unable to assess  Buccal Region  No depletion  Temple Region  Moderate depletion  Clavicle Bone Region  Severe depletion  Clavicle and Acromion Bone Region  Severe depletion  Scapular Bone Region  Severe depletion  Dorsal Hand  Moderate depletion  Patellar Region  Unable to assess  Anterior Thigh Region  Unable to assess  Posterior Calf Region  Unable to assess  Edema (RD Assessment)  None       Diet Order:  DIET DYS 3 Room service appropriate? Yes; Fluid consistency: Thin  EDUCATION NEEDS:   Education needs have been addressed  Skin:  Skin Assessment: Reviewed RN Assessment  Last BM:  12/19  Height:   Ht Readings from Last 1 Encounters:  08/02/17 5\' 1"  (1.549 m)    Weight:   Wt Readings from Last 1 Encounters:  08/07/17 94 lb 9.2 oz (42.9 kg)    Ideal Body Weight:  47.7 kg  BMI:  Body mass index is 17.87 kg/m.  Estimated Nutritional Needs:   Kcal:  1300-1500  Protein:  60-70g  Fluid:  1.5L/day  Clayton Bibles, MS, RD, LDN [facility] Pager: [phone] After Hours Pager: [phone]

## 2017-08-07 NOTE — Progress Notes (Signed)
Physical Therapy Treatment Patient Details Name: Erica York MRN: [id number] DOB: Jan 11, 1941 Today's Date: 08/07/2017    History of Present Illness 76 yo female admitted with duodenitis, anemia, UGIB. hx of kidney/bladder tumor, duodenal ulcer, hernia repair, RLS    PT Comments    Progressing well with mobility. LOB x 1 during first 25 feet of ambulation distance (without UE assist). Pt walked ~500 feet with 1 HHA. Once back in room, pt then walked to and from bathroom without assistance but with intermittent " furniture walking" noted. Will plan to keep on caseload and follow to increase activity and continue to assess balance.     Follow Up Recommendations  No PT follow up;Supervision - Intermittent     Equipment Recommendations  None recommended by PT    Recommendations for Other Services       Precautions / Restrictions Precautions Precautions: Fall Restrictions Weight Bearing Restrictions: No    Mobility  Bed Mobility Overal bed mobility: Modified Independent                Transfers Overall transfer level: Modified independent                  Ambulation/Gait Ambulation/Gait assistance: Min guard;Min assist Ambulation Distance (Feet): 500 Feet Assistive device: 1 person hand held assist;None Gait Pattern/deviations: Step-through pattern;Decreased stride length     General Gait Details: Began without any UE support. LOB x 1 requiring Min assist to preven fall. Provided 1 HHA for remained of walker. No further LOB. Pt tolerated distance well.    Stairs            Wheelchair Mobility    Modified Rankin (Stroke Patients Only)       Balance Overall balance assessment: Needs assistance           Standing balance-Leahy Scale: Fair Standing balance comment: LOB x 1 during ambulation                            Cognition Arousal/Alertness: Awake/alert Behavior During Therapy: WFL for tasks assessed/performed Overall  Cognitive Status: Within Functional Limits for tasks assessed                                        Exercises      General Comments        Pertinent Vitals/Pain Pain Assessment: Faces Faces Pain Scale: Hurts even more Pain Location: abdomen Pain Descriptors / Indicators: Discomfort Pain Intervention(s): Monitored during session    Home Living                      Prior Function            PT Goals (current goals can now be found in the care plan section) Progress towards PT goals: Progressing toward goals    Frequency    Min 3X/week      PT Plan Current plan remains appropriate    Co-evaluation              AM-PAC PT "6 Clicks" Daily Activity  Outcome Measure  Difficulty turning over in bed (including adjusting bedclothes, sheets and blankets)?: None Difficulty moving from lying on back to sitting on the side of the bed? : None Difficulty sitting down on and standing up from a chair with arms (e.g., wheelchair, bedside commode, etc,.)?:  None Help needed moving to and from a bed to chair (including a wheelchair)?: None Help needed walking in hospital room?: A Little Help needed climbing 3-5 steps with a railing? : A Little 6 Click Score: 22    End of Session   Activity Tolerance: Patient tolerated treatment well Patient left: in bed;with call bell/phone within reach         Time: 1040-1052 PT Time Calculation (min) (ACUTE ONLY): 12 min  Charges:  $Gait Training: 8-22 mins                    G Codes:          Weston Anna, MPT Pager: [phone]

## 2017-08-07 NOTE — Progress Notes (Signed)
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

## 2017-08-07 NOTE — Progress Notes (Signed)
PROGRESS NOTE                                                                                                                                                                                                             Patient Demographics:    Erica York, is a 76 y.o. female, DOB - 09-01-1940, IZT:[id number]  Admit date - 08/01/2017   Admitting Physician Juanito Doom, MD  Outpatient Primary MD for the patient is System, Pcp Not In  LOS - 6  Outpatient Specialists:  Chief Complaint  Patient presents with  . 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 She also had associated nonbloody vomiting and dark tarry stools. Epigastric pain started to get worse in the past 2 days and on presentation to the ED her hemoglobin was 6, hypotensive with systolic blood pressure in the 80s. CT of the abdomen concerning for large duodenal ulcer with fistulization to the transverse colon. Patient admitted to ICU and seen by GI and general surgery. Transferred to hospitalist service on 12/16.    Subjective:   Patient complains of some pain over her mid abdominal area around the hernial mesh.   Assessment  & Plan :   Principal problem Upper GI bleed with multiple nonbleeding duodenal ulcers H&H stable with transfusion. EGD  shows few nonbleeding cratered duodenal ulcer with no stigmata of bleeding in the duodenal bulb. Large ulcerated mass with no bleeding found in the first part of duodenum . Biopsy shows peptic duodenitis with ulceration and erosion. Chronic gastritis also noted. Negative for  malignancy. -Discussed with GI. Advanced diet and monitor if she is able to tolerate and and able to pass through. -GI plan on repeat EGD and colonoscopy next month. Recommend PPI twice a day upon discharge and strongly avoid aspirin or any NSAIDs. -discontinued antibiotics (suspect receiving for duodenitis since 12/14).   ? Partial duodenal obstruction. Duodenal biopsy shows peptic duodenitis with ulceration, erosion and inflammation. GI recommends to monitor overnight if patient able to tolerate advanced diet.  COPD with tobacco  use Currently stable. counseled on smoking cessation. Nicotine patch ordered.  Acute blood loss anemia Secondary to upper GI bleed/ duodenal ulcer. Stable after 1 unit PRBC transfusion on admission.  Hypokalemia/hypomagnesemia Replenished  Protein calorie malnutrition, severe Dietitian consult appreciated. Added supplement.  Restless leg syndrome When necessary Xanax.  History of solitary kidney and question mild bladder cancer Patient reports having left nephrectomy 2 years ago and bladder surgery with cancer removal (last surgery in 05/2017).   Code Status : Full code  Family Communication  : None at bedside   Disposition Plan  : Patient moved to [place] recently from [place] to stay with her son. Patient is going to find a PCP in the community.   Barriers For Discharge : Home tomorrow if tolerates advance diet.  Consults  :   [facility]  Procedures  :  CT abdomen and pelvis  EGD  DVT Prophylaxis  : SCDs  Lab Results  Component Value Date   PLT 255 08/07/2017    Antibiotics  :    Anti-infectives (From admission, onward)   Start     Dose/Rate Route Frequency Ordered Stop   08/04/17 2000  ciprofloxacin (CIPRO) tablet 500 mg  Status:  Discontinued     500 mg Oral 2 times daily 08/04/17 1516 08/06/17 1649   08/04/17 1600  metroNIDAZOLE (FLAGYL) tablet 500 mg  Status:  Discontinued     500 mg Oral Every 8 hours 08/04/17 1516  08/06/17 1649   08/01/17 1700  metroNIDAZOLE (FLAGYL) IVPB 500 mg  Status:  Discontinued     500 mg 100 mL/hr over 60 Minutes Intravenous Every 8 hours 08/01/17 1630 08/04/17 1516   08/01/17 1415  ciprofloxacin (CIPRO) IVPB 400 mg     400 mg 200 mL/hr over 60 Minutes Intravenous  Once 08/01/17 1411 08/01/17 1712   08/01/17 1400  ciprofloxacin (CIPRO) IVPB 400 mg  Status:  Discontinued     400 mg 200 mL/hr over 60 Minutes Intravenous Every 12 hours 08/01/17 1403 08/04/17 1516   08/01/17 1330  metroNIDAZOLE (FLAGYL) IVPB 500 mg  Status:  Discontinued     500 mg 100 mL/hr over 60 Minutes Intravenous  Once 08/01/17 1315 08/01/17 1821   08/01/17 1315  ceFEPIme (MAXIPIME) 2 g in dextrose 5 % 50 mL IVPB  Status:  Discontinued     2 g 100 mL/hr over 30 Minutes Intravenous  Once 08/01/17 1315 08/01/17 1403        Objective:   Vitals:   08/06/17 1628 08/06/17 2122 08/07/17 0500 08/07/17 0558  BP: 132/76 138/76  127/85  Pulse: 79 83  77  Resp: 16 16  16   Temp:  (!) 97.5 F (36.4 C)  98.5 F (36.9 C)  TempSrc:  Oral  Oral  SpO2: 94% 97%  97%  Weight:   42.9 kg (94 lb 9.2 oz)   Height:        Wt Readings from Last 3 Encounters:  08/07/17 42.9 kg (94 lb 9.2 oz)     Intake/Output Summary (Last 24 hours) at 08/07/2017 1357 Last data filed at 08/07/2017 0855 Gross per 24 hour  Intake 1216.25 ml  Output -  Net 1216.25 ml     Physical Exam Gen.: Elderly female not in distress HEENT: Pallor present, moist mucosa, supple neck Chest: Clear bilaterally CVS: Normal S1 and S2, no murmurs GI: Soft, nondistended, bowel sounds present, abdominal surgical scars. Minimal tenderness over epigastric area Musculoskeletal: Warm, no edema  Data Review:    CBC Recent Labs  Lab 08/01/17 0752  08/03/17 0323 08/04/17 0307 08/05/17 0356 08/06/17 0442 08/07/17 0424  WBC 14.2*   < > 6.8 5.6 7.9 7.5 6.3  HGB 7.7*   < > 7.7* 8.0* 9.1* 8.5* 9.1*  HCT 26.3*   < > 25.2* 26.1* 29.9* 27.9*  29.4*  PLT 398   < > 243 250 324 285 255  MCV 75.6*   < > 77.8* 78.1 76.9* 76.2* 75.8*  MCH 22.1*   < > 23.8* 24.0* 23.4* 23.2* 23.5*  MCHC 29.3*   < > 30.6 30.7 30.4 30.5 31.0  RDW 17.1*   < > 17.6* 17.9* 18.2* 18.1* 18.2*  LYMPHSABS 0.6*  --   --  1.0  --   --   --   MONOABS 0.7  --   --  0.4  --   --   --   EOSABS 0.3  --   --  0.3  --   --   --   BASOSABS 0.0  --   --  0.0  --   --   --    < > = values in this interval not displayed.    Chemistries  Recent Labs  Lab 08/01/17 0752 08/01/17 1823 08/02/17 0241 08/03/17 0323 08/04/17 0307 08/05/17 0356 08/06/17 0442  NA 138  --  137 135 139 138 136  K 4.6  --  4.0 3.7 3.1* 3.6 3.6  CL 107  --  110 108 109 111 109  CO2 23  --  22 19* 22 18* 22  GLUCOSE 113*  --  104* 82 80 83 113*  BUN 14  --  7 7 6 7 6   CREATININE 0.92  --  0.79 0.78 0.83 0.80 0.79  CALCIUM 9.0  --  7.9* 8.0* 8.2* 8.7* 8.3*  MG  --  1.5*  --   --  1.6* 1.5* 2.1  AST 16 13* 15  --   --   --   --   ALT 9* 7* 9*  --   --   --   --   ALKPHOS 127* 98 104  --   --   --   --   BILITOT 0.2* 0.6 0.6  --   --   --   --    ------------------------------------------------------------------------------------------------------------------ No results for input(s): CHOL, HDL, LDLCALC, TRIG, CHOLHDL, LDLDIRECT in the last 72 hours.  No results found for: HGBA1C ------------------------------------------------------------------------------------------------------------------ No results for input(s): TSH, T4TOTAL, T3FREE, THYROIDAB in the last 72 hours.  Invalid input(s): FREET3 ------------------------------------------------------------------------------------------------------------------ No results for input(s): VITAMINB12, FOLATE, FERRITIN, TIBC, IRON, RETICCTPCT in the last 72 hours.  Coagulation profile Recent Labs  Lab 08/01/17 1823  INR 0.98    No results for input(s): DDIMER in the last 72 hours.  Cardiac Enzymes No results for input(s): CKMB,  TROPONINI, MYOGLOBIN in the last 168 hours.  Invalid input(s): CK ------------------------------------------------------------------------------------------------------------------ No results found for: BNP  Inpatient Medications  Scheduled Meds: . feeding supplement  1 Container Oral Once  . pantoprazole  40 mg Oral BID AC  . sertraline  100 mg Oral QHS   Continuous Infusions: . sodium chloride Stopped (08/06/17 1901)  . dextrose 5 % and 0.45 % NaCl with KCl 20 mEq/L Stopped (08/03/17 1318)  . magnesium sulfate 1 - 4 g bolus IVPB     PRN Meds:.ALPRAZolam, fentaNYL (SUBLIMAZE) injection, rOPINIRole  Micro Results Recent Results (from the past 240 hour(s))  MRSA PCR  Screening     Status: None   Collection Time: 08/01/17  2:30 PM  Result Value Ref Range Status   MRSA by PCR NEGATIVE NEGATIVE Final    Comment:        The GeneXpert MRSA Assay (FDA approved for NASAL specimens only), is one component of a comprehensive MRSA colonization surveillance program. It is not intended to diagnose MRSA infection nor to guide or monitor treatment for MRSA infections.   Culture, blood (routine x 2)     Status: None   Collection Time: 08/01/17  6:23 PM  Result Value Ref Range Status   Specimen Description BLOOD LEFT HAND  Final   Special Requests IN PEDIATRIC BOTTLE Blood Culture adequate volume  Final   Culture   Final    NO GROWTH 5 DAYS Performed at [facility], [place] [address], [place], [place] [address]    Report Status 08/06/2017 FINAL  Final  Culture, blood (routine x 2)     Status: None   Collection Time: 08/01/17  6:23 PM  Result Value Ref Range Status   Specimen Description BLOOD LEFT ANTECUBITAL  Final   Special Requests   Final    BOTTLES DRAWN AEROBIC ONLY Blood Culture adequate volume   Culture   Final    NO GROWTH 5 DAYS Performed at [facility], [place] [address], [place], [place] [address]    Report Status 08/06/2017 FINAL  Final    Radiology  Reports Ct Abdomen Pelvis W Contrast  Result Date: 08/01/2017 CLINICAL DATA:  Generalized abdominal pain, nausea, vomiting and diarrhea for 1 week with bloody stools. EXAM: CT ABDOMEN AND PELVIS WITH CONTRAST TECHNIQUE: Multidetector CT imaging of the abdomen and pelvis was performed using the standard protocol following bolus administration of intravenous contrast. CONTRAST:  31mL ISOVUE-300 IOPAMIDOL (ISOVUE-300) INJECTION 61% COMPARISON:  08/01/2017 abdominal radiographs . FINDINGS: Lower chest: Advanced emphysema at the lung bases. A few scattered small pulmonary nodules at both lung bases, largest 4 mm in the lingula (series 6/ image 5). Mild scarring versus atelectasis in the dependent lung bases. Coronary atherosclerosis. Hepatobiliary: Normal liver size. Granulomatous right liver dome calcification. No liver mass. Cholecystectomy. Mild diffuse intrahepatic biliary ductal dilatation. Prominent common bile duct dilation (19 mm diameter, with smooth distal tapering of the common bile duct. No radiopaque choledocholithiasis. Pancreas: Mild diffuse main pancreatic duct dilation up to 5 mm diameter. No discrete pancreatic mass. Spleen: Normal size spleen. Hypodense 0.5 cm posterior splenic lesion is too small to characterize. Adrenals/Urinary Tract: Normal adrenals. Status post left nephrectomy with no mass or fluid collection in the left nephrectomy bed. Compensatory hypertrophy of the right kidney. Hypodense 1.2 cm renal cortical lesion in the medial lower right kidney (series 7/image 12) with density 22 HU. Subcentimeter hypodense renal cortical lesion in the lateral upper right kidney, too small to characterize. No right hydronephrosis. There is curvilinear calcification in the anterior superior midline bladder wall. Otherwise normal bladder. Stomach/Bowel: Normal nondistended stomach. There is circumferential wall thickening throughout the first and second portions of the duodenum with associated mucosal  hyperenhancement and periduodenal fat stranding. There is a suggestion of abnormal fistulous communication of the duodenal bulb with the transverse colon (series 5/ image 52). There is mild wall thickening and mild dilatation throughout the transverse colon. Normal caliber small bowel. No additional sites of small bowel wall thickening. Appendix not discretely visualized. No pericecal inflammatory changes . Postsurgical changes are noted from partial distal colectomy with intact appearing distal  colonic anastomosis. Mild residual diverticulosis in the remnant left colon with no additional sites of large bowel wall thickening. Vascular/Lymphatic: Atherosclerotic abdominal aorta with right-sided 3.0 cm saccular infrarenal aortic aneurysm. Patent hepatic, portal, splenic and right renal veins. No pathologically enlarged lymph nodes in the abdomen or pelvis. Reproductive: Status post hysterectomy, with no abnormal findings at the vaginal cuff. No adnexal mass. Other: No pneumoperitoneum, ascites or focal fluid collection. Evidence of prior ventral abdominal mesh repair with no evidence of a recurrent ventral abdominal hernia. Musculoskeletal: No aggressive appearing focal osseous lesions. Mild L1, mild to moderate L2, severe L4 and mild L5 vertebral compression fractures of uncertain chronicity. Marked lumbar spondylosis. Prominent levocurvature of the thoracolumbar spine. Diffuse osteopenia. IMPRESSION: 1. 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 No free air. No abscess. Recommend GI consultation for consideration of upper endoscopic correlation. 2. Prominent common bile duct dilation status post cholecystectomy (CBD diameter 19 mm). No radiopaque choledocholithiasis. Recommend correlation with liver function tests. 3. Aortic Atherosclerosis (ICD10-I70.0) and Emphysema (ICD10-J43.9). Coronary atherosclerosis. 4. Saccular 3.0 cm infrarenal  abdominal aortic aneurysm. Recommend followup by ultrasound in 3 years. This recommendation follows ACR consensus guidelines: White Paper of the ACR Incidental Findings Committee II on Vascular Findings. J Am Coll Radiol 2013; 10:789-794. 5. Multilevel lumbar vertebral compression fractures of uncertain chronicity, probably chronic. 6. Scattered tiny pulmonary nodules at the lung bases, largest 4 mm. No follow-up needed if patient is low-risk (and has no known or suspected primary neoplasm). Non-contrast chest CT can be considered in 12 months if patient is high-risk. This recommendation follows the consensus statement: Guidelines for Management of Incidental Pulmonary Nodules Detected on CT Images: From the Fleischner Society 2017; Radiology 2017; 284:228-243. 7. Indeterminate small 1.2 cm low-attenuation renal cortical lesion in the medial lower right kidney, recommend evaluation on a short term outpatient basis with MRI (preferred) or CT abdomen without and with IV contrast to exclude a renal neoplasm. Electronically Signed   By: Ilona Sorrel M.D.   On: 08/01/2017 11:56   Dg Abdomen Acute W/chest  Result Date: 08/01/2017 CLINICAL DATA:  Mid abdominal pain. EXAM: DG ABDOMEN ACUTE W/ 1V CHEST COMPARISON:  None. FINDINGS: There are dilated loops of bowel in the mid abdomen, containing gas fluid levels with maximum diameter of 4.4 cm. It is difficult to determine whether these represent small or large bowel loops. Extensive surgical changes in the abdomen and pelvis. Large area of punctate calcifications in the pelvis with uncertain etiology. Normal cardiac silhouette. Calcific atherosclerotic disease of the aorta. Upper lobe predominant emphysematous changes of the lungs. No evidence of focal consolidation, pleural effusion or pneumothorax. IMPRESSION: Nonspecific bowel gas pattern with dilated bowel loops containing gas fluid levels in the mid abdomen. It is difficult to determine whether these represent small  or large bowel loops, given extensive postsurgical changes in the abdomen. If these represent small bowel loops, then this is evidence of small bowel ileus or early/ incomplete small bowel obstruction. Emphysematous changes of the lungs. Electronically Signed   By: Fidela Salisbury M.D.   On: 08/01/2017 10:41   Dg Duanne Limerick W/water Sol Cm  Result Date: 08/04/2017 CLINICAL DATA:  76 year old female post multiple surgeries with duodenitis and question of fistula from duodenal bulb to transverse colon. Subsequent encounter. EXAM: WATER SOLUBLE UPPER GI SERIES TECHNIQUE: Single-column upper GI series was performed using water soluble contrast. COMPARISON:  08/01/2017 CT. FLUOROSCOPY TIME:  Fluoroscopy Time:  2 minutes  and 24 seconds Radiation Exposure Index:  32.8 mGy FINDINGS: Exam was tailored specifically to evaluate for possible fistula as question on recent CT. Scout view reveals scoliosis thoracic lumbar spine convex left. Multiple surgical clips and staples in place. Vascular calcifications. No fistula noted between duodenum and transverse colon. Remainder of exam limited by technique. No focal gastric or duodenal ulcer noted. IMPRESSION: No fistula identified between duodenum and transverse colon. Aortic Atherosclerosis (ICD10-I70.0). Electronically Signed   By: Genia Del M.D.   On: 08/04/2017 12:30    Time Spent in minutes  25   Budd Freiermuth M.D on 08/07/2017 at 1:57 PM  Between 7am to 7pm - Pager - [phone]  After 7pm go to [email or web address] - password [facility]  [facility] -  Office  [phone]

## 2017-08-08 DIAGNOSIS — E43 Unspecified severe protein-calorie malnutrition: Secondary | ICD-10-CM

## 2017-08-08 DIAGNOSIS — K2980 Duodenitis without bleeding: Secondary | ICD-10-CM

## 2017-08-08 MED ORDER — ROPINIROLE HCL 1 MG PO TABS: 1.0000 mg | ORAL_TABLET | Freq: Every evening | ORAL | 1 refills | Status: DC | PRN

## 2017-08-08 MED ORDER — ALPRAZOLAM 0.5 MG PO TABS: 0.5000 mg | ORAL_TABLET | Freq: Three times a day (TID) | ORAL | 0 refills | Status: DC | PRN

## 2017-08-08 MED ORDER — SERTRALINE HCL 100 MG PO TABS: 100.0000 mg | ORAL_TABLET | Freq: Every day | ORAL | 1 refills | Status: AC

## 2017-08-08 MED ORDER — PANTOPRAZOLE SODIUM 40 MG PO TBEC: 40.0000 mg | DELAYED_RELEASE_TABLET | Freq: Two times a day (BID) | ORAL | 1 refills | Status: DC

## 2017-08-08 NOTE — Discharge Summary (Signed)
Physician Discharge Summary  Erica York UVO:[id number] DOB: October 04, 1940 DOA: 08/01/2017  PCP: System, Pcp Not In  Admit date: 08/01/2017 Discharge date: 08/08/2017  Admitted From: home Disposition:  home  Recommendations for Outpatient Follow-up:  1. Follow up with [facility] in 2-4 weeks  Home Health: none Equipment/Devices: none  Discharge Condition: stable  CODE STATUS: Full code Diet recommendation: soft diet  HPI: Per PCCM, 76 year old female with a prior history of duodenal ulcers about 40 years ago.  Also has had history of diverticulitis status post prior partial colectomy as well as multiple hernia repairs.  Also use of NSAIDs for headache several times a week..  Most recent surgery about 6 years ago.  Doing well with the exception of diarrhea which is been chronic in nature.  Presents to emergency room with chief complaint of progressive sharp radiating abdominal pain this is been going on for about 2 weeks.  She has had some associated nonbloody vomiting episodes, also noting dark tarry stools.  The epigastric discomfort has been getting worse over the last 2 days so she presents to the emergency room for further evaluation.  Upon arrival hemoglobin noted to be around 6.  Mildly hypotensive with systolic blood pressure in the 80s.  A CT of abdomen was obtained raising concern for large duodenal ulcer and fistulization to the transverse colon.  She has been seen by both GI as well as surgical consultation has been requested.  She was asked by the critical care team to admit.  Hospital Course: Discharge Diagnoses:  Active Problems:   Duodenitis   Blood loss anemia   Protein-calorie malnutrition, severe   Upper GI bleed with multiple nonbleeding duodenal ulcers -patient was admitted to the hospital with upper GI bleed due to multiple nonbleeding duodenal ulcers.  She was initially in the ICU.  GI was consulted and have followed patient while hospitalized.  She underwent an  EGD which showed few nonbleeding cratered duodenal ulcers with no stigmata of bleeding in the duodenal bulb. Large ulcerated mass with no bleeding found in the first part of duodenum. Biopsy shows peptic duodenitis with ulceration and erosion. Chronic gastritis also noted. Negative for malignancy.  She stabilized, her diet was advanced and she is currently able to tolerate a soft diet.  Her hemoglobin has remained stable.  GI plans to repeat EGD and colonoscopy next month as an outpatient, and recommended PPI twice daily and avoiding aspirin or any NSAIDs.  ? Partial duodenal obstruction -Duodenal biopsy shows peptic duodenitis with ulceration, erosion and inflammation.  She is tolerating a soft diet, outpatient follow-up with GI COPD with tobacco use -Currently stable. counseled on smoking cessation.  Acute blood loss anemia -Secondary to upper GI bleed/ duodenal ulcer. Stable after 1 unit PRBC transfusion on admission.  Hypokalemia/hypomagnesemia Protein calorie malnutrition, severe  Anxiety/restless leg syndrome -When necessary Xanax.  She currently does not have a PCP but will get one after January 1, short course of Xanax refilled for patient as she ran out, to prevent withdrawals History of solitary kidney and question bladder cancer -Patient reports having left nephrectomy 2 years ago and bladder surgery with cancer removal (last surgery in 05/2017).  Outpatient surveillance    Discharge Instructions   Allergies as of 08/08/2017      Reactions   Zosyn [piperacillin Sod-tazobactam So]       Medication List    TAKE these medications   ALPRAZolam 0.5 MG tablet Commonly known as:  XANAX Take 1 tablet (0.5 mg  total) by mouth 3 (three) times daily as needed for anxiety.   colesevelam 625 MG tablet Commonly known as:  WELCHOL Take 625 mg by mouth daily.   pantoprazole 40 MG tablet Commonly known as:  PROTONIX Take 1 tablet (40 mg total) by mouth 2 (two) times daily before a meal.  After 1 month take 1 daily in the morning   potassium chloride SA 20 MEQ tablet Commonly known as:  K-DUR,KLOR-CON Take 20 mEq by mouth 2 (two) times daily.   rOPINIRole 1 MG tablet Commonly known as:  REQUIP Take 1 tablet (1 mg total) by mouth at bedtime as needed (Sleep). What changed:  how much to take   sertraline 100 MG tablet Commonly known as:  ZOLOFT Take 1 tablet (100 mg total) by mouth at bedtime.      Follow-up Information    [facility]   Why:  for hospital discharge follow up and to discuss outpatient colonoscopy Contact information: [address] [place] [address] [phone]           Consultations:  GI  General surgery   Procedures/Studies:  EGD  Ct Abdomen Pelvis W Contrast  Result Date: 08/01/2017 CLINICAL DATA:  Generalized abdominal pain, nausea, vomiting and diarrhea for 1 week with bloody stools. EXAM: CT ABDOMEN AND PELVIS WITH CONTRAST TECHNIQUE: Multidetector CT imaging of the abdomen and pelvis was performed using the standard protocol following bolus administration of intravenous contrast. CONTRAST:  23mL ISOVUE-300 IOPAMIDOL (ISOVUE-300) INJECTION 61% COMPARISON:  08/01/2017 abdominal radiographs . FINDINGS: Lower chest: Advanced emphysema at the lung bases. A few scattered small pulmonary nodules at both lung bases, largest 4 mm in the lingula (series 6/ image 5). Mild scarring versus atelectasis in the dependent lung bases. Coronary atherosclerosis. Hepatobiliary: Normal liver size. Granulomatous right liver dome calcification. No liver mass. Cholecystectomy. Mild diffuse intrahepatic biliary ductal dilatation. Prominent common bile duct dilation (19 mm diameter, with smooth distal tapering of the common bile duct. No radiopaque choledocholithiasis. Pancreas: Mild diffuse main pancreatic duct dilation up to 5 mm diameter. No discrete pancreatic mass. Spleen: Normal size spleen. Hypodense 0.5 cm posterior  splenic lesion is too small to characterize. Adrenals/Urinary Tract: Normal adrenals. Status post left nephrectomy with no mass or fluid collection in the left nephrectomy bed. Compensatory hypertrophy of the right kidney. Hypodense 1.2 cm renal cortical lesion in the medial lower right kidney (series 7/image 12) with density 22 HU. Subcentimeter hypodense renal cortical lesion in the lateral upper right kidney, too small to characterize. No right hydronephrosis. There is curvilinear calcification in the anterior superior midline bladder wall. Otherwise normal bladder. Stomach/Bowel: Normal nondistended stomach. There is circumferential wall thickening throughout the first and second portions of the duodenum with associated mucosal hyperenhancement and periduodenal fat stranding. There is a suggestion of abnormal fistulous communication of the duodenal bulb with the transverse colon (series 5/ image 52). There is mild wall thickening and mild dilatation throughout the transverse colon. Normal caliber small bowel. No additional sites of small bowel wall thickening. Appendix not discretely visualized. No pericecal inflammatory changes . Postsurgical changes are noted from partial distal colectomy with intact appearing distal colonic anastomosis. Mild residual diverticulosis in the remnant left colon with no additional sites of large bowel wall thickening. Vascular/Lymphatic: Atherosclerotic abdominal aorta with right-sided 3.0 cm saccular infrarenal aortic aneurysm. Patent hepatic, portal, splenic and right renal veins. No pathologically enlarged lymph nodes in the abdomen or pelvis. Reproductive: Status post hysterectomy, with no  abnormal findings at the vaginal cuff. No adnexal mass. Other: No pneumoperitoneum, ascites or focal fluid collection. Evidence of prior ventral abdominal mesh repair with no evidence of a recurrent ventral abdominal hernia. Musculoskeletal: No aggressive appearing focal osseous lesions.  Mild L1, mild to moderate L2, severe L4 and mild L5 vertebral compression fractures of uncertain chronicity. Marked lumbar spondylosis. Prominent levocurvature of the thoracolumbar spine. Diffuse osteopenia. IMPRESSION: 1. 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 No free air. No abscess. Recommend GI consultation for consideration of upper endoscopic correlation. 2. Prominent common bile duct dilation status post cholecystectomy (CBD diameter 19 mm). No radiopaque choledocholithiasis. Recommend correlation with liver function tests. 3. Aortic Atherosclerosis (ICD10-I70.0) and Emphysema (ICD10-J43.9). Coronary atherosclerosis. 4. Saccular 3.0 cm infrarenal abdominal aortic aneurysm. Recommend followup by ultrasound in 3 years. This recommendation follows ACR consensus guidelines: White Paper of the ACR Incidental Findings Committee II on Vascular Findings. J Am Coll Radiol 2013; 10:789-794. 5. Multilevel lumbar vertebral compression fractures of uncertain chronicity, probably chronic. 6. Scattered tiny pulmonary nodules at the lung bases, largest 4 mm. No follow-up needed if patient is low-risk (and has no known or suspected primary neoplasm). Non-contrast chest CT can be considered in 12 months if patient is high-risk. This recommendation follows the consensus statement: Guidelines for Management of Incidental Pulmonary Nodules Detected on CT Images: From the Fleischner Society 2017; Radiology 2017; 284:228-243. 7. Indeterminate small 1.2 cm low-attenuation renal cortical lesion in the medial lower right kidney, recommend evaluation on a short term outpatient basis with MRI (preferred) or CT abdomen without and with IV contrast to exclude a renal neoplasm. Electronically Signed   By: Ilona Sorrel M.D.   On: 08/01/2017 11:56   Dg Abdomen Acute W/chest  Result Date: 08/01/2017 CLINICAL DATA:  Mid abdominal pain. EXAM: DG ABDOMEN ACUTE W/  1V CHEST COMPARISON:  None. FINDINGS: There are dilated loops of bowel in the mid abdomen, containing gas fluid levels with maximum diameter of 4.4 cm. It is difficult to determine whether these represent small or large bowel loops. Extensive surgical changes in the abdomen and pelvis. Large area of punctate calcifications in the pelvis with uncertain etiology. Normal cardiac silhouette. Calcific atherosclerotic disease of the aorta. Upper lobe predominant emphysematous changes of the lungs. No evidence of focal consolidation, pleural effusion or pneumothorax. IMPRESSION: Nonspecific bowel gas pattern with dilated bowel loops containing gas fluid levels in the mid abdomen. It is difficult to determine whether these represent small or large bowel loops, given extensive postsurgical changes in the abdomen. If these represent small bowel loops, then this is evidence of small bowel ileus or early/ incomplete small bowel obstruction. Emphysematous changes of the lungs. Electronically Signed   By: Fidela Salisbury M.D.   On: 08/01/2017 10:41   Dg Duanne Limerick W/water Sol Cm  Result Date: 08/04/2017 CLINICAL DATA:  76 year old female post multiple surgeries with duodenitis and question of fistula from duodenal bulb to transverse colon. Subsequent encounter. EXAM: WATER SOLUBLE UPPER GI SERIES TECHNIQUE: Single-column upper GI series was performed using water soluble contrast. COMPARISON:  08/01/2017 CT. FLUOROSCOPY TIME:  Fluoroscopy Time:  2 minutes and 24 seconds Radiation Exposure Index:  32.8 mGy FINDINGS: Exam was tailored specifically to evaluate for possible fistula as question on recent CT. Scout view reveals scoliosis thoracic lumbar spine convex left. Multiple surgical clips and staples in place. Vascular calcifications. No fistula noted between duodenum and transverse colon. Remainder of exam limited by  technique. No focal gastric or duodenal ulcer noted. IMPRESSION: No fistula identified between duodenum and  transverse colon. Aortic Atherosclerosis (ICD10-I70.0). Electronically Signed   By: Genia Del M.D.   On: 08/04/2017 12:30      Subjective: - no chest pain, shortness of breath, no abdominal pain, nausea or vomiting.   Discharge Exam: Vitals:   08/07/17 2105 08/08/17 0438  BP: 133/85 128/67  Pulse: 95 85  Resp: 20 16  Temp: 98.4 F (36.9 C) 98 F (36.7 C)  SpO2: 95% 95%    General: Pt is alert, awake, not in acute distress Cardiovascular: RRR, S1/S2 +, no rubs, no gallops Respiratory: CTA bilaterally, no wheezing, no rhonchi Abdominal: Soft, NT, ND, bowel sounds + Extremities: no edema, no cyanosis    The results of significant diagnostics from this hospitalization (including imaging, microbiology, ancillary and laboratory) are listed below for reference.     Microbiology: Recent Results (from the past 240 hour(s))  MRSA PCR Screening     Status: None   Collection Time: 08/01/17  2:30 PM  Result Value Ref Range Status   MRSA by PCR NEGATIVE NEGATIVE Final    Comment:        The GeneXpert MRSA Assay (FDA approved for NASAL specimens only), is one component of a comprehensive MRSA colonization surveillance program. It is not intended to diagnose MRSA infection nor to guide or monitor treatment for MRSA infections.   Culture, blood (routine x 2)     Status: None   Collection Time: 08/01/17  6:23 PM  Result Value Ref Range Status   Specimen Description BLOOD LEFT HAND  Final   Special Requests IN PEDIATRIC BOTTLE Blood Culture adequate volume  Final   Culture   Final    NO GROWTH 5 DAYS Performed at [facility], [place] [address], [place], [place] [address]    Report Status 08/06/2017 FINAL  Final  Culture, blood (routine x 2)     Status: None   Collection Time: 08/01/17  6:23 PM  Result Value Ref Range Status   Specimen Description BLOOD LEFT ANTECUBITAL  Final   Special Requests   Final    BOTTLES DRAWN AEROBIC ONLY Blood Culture adequate volume    Culture   Final    NO GROWTH 5 DAYS Performed at [facility], [place] [address], [place], [place] [address]    Report Status 08/06/2017 FINAL  Final     Labs: BNP (last 3 results) No results for input(s): BNP in the last 8760 hours. Basic Metabolic Panel: Recent Labs  Lab 08/01/17 1823 08/02/17 0241 08/03/17 0323 08/04/17 0307 08/05/17 0356 08/06/17 0442  NA  --  137 135 139 138 136  K  --  4.0 3.7 3.1* 3.6 3.6  CL  --  110 108 109 111 109  CO2  --  22 19* 22 18* 22  GLUCOSE  --  104* 82 80 83 113*  BUN  --  7 7 6 7 6   CREATININE  --  0.79 0.78 0.83 0.80 0.79  CALCIUM  --  7.9* 8.0* 8.2* 8.7* 8.3*  MG 1.5*  --   --  1.6* 1.5* 2.1  PHOS 3.1  --   --   --   --   --    Liver Function Tests: Recent Labs  Lab 08/01/17 1823 08/02/17 0241  AST 13* 15  ALT 7* 9*  ALKPHOS 98 104  BILITOT 0.6 0.6  PROT 5.6* 5.5*  ALBUMIN  2.7* 2.7*   No results for input(s): LIPASE, AMYLASE in the last 168 hours. No results for input(s): AMMONIA in the last 168 hours. CBC: Recent Labs  Lab 08/03/17 0323 08/04/17 0307 08/05/17 0356 08/06/17 0442 08/07/17 0424  WBC 6.8 5.6 7.9 7.5 6.3  NEUTROABS  --  3.8  --   --   --   HGB 7.7* 8.0* 9.1* 8.5* 9.1*  HCT 25.2* 26.1* 29.9* 27.9* 29.4*  MCV 77.8* 78.1 76.9* 76.2* 75.8*  PLT 243 250 324 285 255   Cardiac Enzymes: No results for input(s): CKTOTAL, CKMB, CKMBINDEX, TROPONINI in the last 168 hours. BNP: Invalid input(s): POCBNP CBG: No results for input(s): GLUCAP in the last 168 hours. D-Dimer No results for input(s): DDIMER in the last 72 hours. Hgb A1c No results for input(s): HGBA1C in the last 72 hours. Lipid Profile No results for input(s): CHOL, HDL, LDLCALC, TRIG, CHOLHDL, LDLDIRECT in the last 72 hours. Thyroid function studies No results for input(s): TSH, T4TOTAL, T3FREE, THYROIDAB in the last 72 hours.  Invalid input(s): FREET3 Anemia work up No results for input(s): VITAMINB12, FOLATE, FERRITIN, TIBC, IRON,  RETICCTPCT in the last 72 hours. Urinalysis    Component Value Date/Time   COLORURINE STRAW (A) 08/01/2017 0838   APPEARANCEUR CLEAR 08/01/2017 0838   LABSPEC 1.011 08/01/2017 0838   PHURINE 6.0 08/01/2017 0838   GLUCOSEU NEGATIVE 08/01/2017 0838   HGBUR NEGATIVE 08/01/2017 0838   BILIRUBINUR NEGATIVE 08/01/2017 0838   KETONESUR NEGATIVE 08/01/2017 0838   PROTEINUR NEGATIVE 08/01/2017 0838   NITRITE NEGATIVE 08/01/2017 0838   LEUKOCYTESUR TRACE (A) 08/01/2017 0838   Sepsis Labs Invalid input(s): PROCALCITONIN,  WBC,  LACTICIDVEN   Time coordinating discharge: 45 minutes  SIGNED:  Marzetta Board, MD  [facility] 08/08/2017, 10:54 AM Pager [phone]  If 7PM-7AM, please contact night-coverage [email or web address] Password [email or web address]

## 2017-08-08 NOTE — Discharge Instructions (Signed)
Follow with [facility] in 3-4 weeks   Take Protonix twice daily for a month then once daily Avoid all NSAIDs. If you are not sure which over the counter medication is an NSAID, ask your pharmacist.  Please get a complete blood count and chemistry panel checked by your Primary MD at your next visit, and again as instructed by your Primary MD. Please get your medications reviewed and adjusted by your Primary MD.  Please request your Primary MD to go over all Hospital Tests and Procedure/Radiological results at the follow up, please get all Hospital records sent to your Prim MD by signing hospital release before you go home.  If you had Pneumonia of Lung problems at the Hospital: Please get a 2 view Chest X ray done in 6-8 weeks after hospital discharge or sooner if instructed by your Primary MD.  If you have Congestive Heart Failure: Please call your Cardiologist or Primary MD anytime you have any of the following symptoms:  1) 3 pound weight gain in 24 hours or 5 pounds in 1 week  2) shortness of breath, with or without a dry hacking cough  3) swelling in the hands, feet or stomach  4) if you have to sleep on extra pillows at night in order to breathe  Follow cardiac low salt diet and 1.5 lit/day fluid restriction.  If you have diabetes Accuchecks 4 times/day, Once in AM empty stomach and then before each meal. Log in all results and show them to your primary doctor at your next visit. If any glucose reading is under 80 or above 300 call your primary MD immediately.  If you have Seizure/Convulsions/Epilepsy: Please do not drive, operate heavy machinery, participate in activities at heights or participate in high speed sports until you have seen by Primary MD or a Neurologist and advised to do so again.  If you had Gastrointestinal Bleeding: Please ask your Primary MD to check a complete blood count within one week of discharge or at your next visit. Your endoscopic/colonoscopic biopsies  that are pending at the time of discharge, will also need to followed by your Primary MD.  Get Medicines reviewed and adjusted. Please take all your medications with you for your next visit with your Primary MD  Please request your Primary MD to go over all hospital tests and procedure/radiological results at the follow up, please ask your Primary MD to get all Hospital records sent to his/her office.  If you experience worsening of your admission symptoms, develop shortness of breath, life threatening emergency, suicidal or homicidal thoughts you must seek medical attention immediately by calling 911 or calling your MD immediately  if symptoms less severe.  You must read complete instructions/literature along with all the possible adverse reactions/side effects for all the Medicines you take and that have been prescribed to you. Take any new Medicines after you have completely understood and accpet all the possible adverse reactions/side effects.   Do not drive or operate heavy machinery when taking Pain medications.   Do not take more than prescribed Pain, Sleep and Anxiety Medications  Special Instructions: If you have smoked or chewed Tobacco  in the last 2 yrs please stop smoking, stop any regular Alcohol  and or any Recreational drug use.  Wear Seat belts while driving.  Please note You were cared for by a hospitalist during your hospital stay. If you have any questions about your discharge medications or the care you received while you were in the hospital  after you are discharged, you can call the unit and asked to speak with the hospitalist on call if the hospitalist that took care of you is not available. Once you are discharged, your primary care physician will handle any further medical issues. Please note that NO REFILLS for any discharge medications will be authorized once you are discharged, as it is imperative that you return to your primary care physician (or establish a  relationship with a primary care physician if you do not have one) for your aftercare needs so that they can reassess your need for medications and monitor your lab values.  You can reach the hospitalist office at phone [phone] or fax [phone]   If you do not have a primary care physician, you can call [phone] for a physician referral.  Activity: As tolerated with Full fall precautions use walker/cane & assistance as needed  Diet: soft  Disposition Home

## 2017-08-08 NOTE — Final Consult Note (Signed)
[facility] Progress Note  2 Days Post-Op  Subjective: CC: mild abdominal pain Patient had some mild abdominal pain after eating last night, but is doing better this AM. Tolerating breakfast. Discussed that she does not need any surgical intervention currently but that if she were to in the future it would be a complicated procedure. Patient states that she would likely not want surgical intervention.  VSS.   Objective: Vital signs in last 24 hours: Temp:  [98 F (36.7 C)-98.4 F (36.9 C)] 98 F (36.7 C) (12/21 0438) Pulse Rate:  [74-95] 85 (12/21 0438) Resp:  [16-20] 16 (12/21 0438) BP: (116-133)/(67-85) 128/67 (12/21 0438) SpO2:  [95 %-99 %] 95 % (12/21 0438) Weight:  [44 kg (97 lb 0 oz)] 44 kg (97 lb 0 oz) (12/21 0450) Last BM Date: 08/07/17  Intake/Output from previous day: 12/20 0701 - 12/21 0700 In: 720 [P.O.:720] Out: -  Intake/Output this shift: No intake/output data recorded.  PE: Gen:  Alert, NAD, pleasant Pulm:  Normal effort Abd: Soft, non-tender, non-distended Skin: warm and dry, no rashes  Psych: A&Ox3   Lab Results:  Recent Labs    08/06/17 0442 08/07/17 0424  WBC 7.5 6.3  HGB 8.5* 9.1*  HCT 27.9* 29.4*  PLT 285 255   BMET Recent Labs    08/06/17 0442  NA 136  K 3.6  CL 109  CO2 22  GLUCOSE 113*  BUN 6  CREATININE 0.79  CALCIUM 8.3*   PT/INR No results for input(s): LABPROT, INR in the last 72 hours. CMP     Component Value Date/Time   NA 136 08/06/2017 0442   K 3.6 08/06/2017 0442   CL 109 08/06/2017 0442   CO2 22 08/06/2017 0442   GLUCOSE 113 (H) 08/06/2017 0442   BUN 6 08/06/2017 0442   CREATININE 0.79 08/06/2017 0442   CALCIUM 8.3 (L) 08/06/2017 0442   PROT 5.5 (L) 08/02/2017 0241   ALBUMIN 2.7 (L) 08/02/2017 0241   AST 15 08/02/2017 0241   ALT 9 (L) 08/02/2017 0241   ALKPHOS 104 08/02/2017 0241   BILITOT 0.6 08/02/2017 0241   GFRNONAA >60 08/06/2017 0442   GFRAA >60 08/06/2017 0442   Lipase      Component Value Date/Time   LIPASE 23 08/01/2017 0752       Studies/Results: No results found.  Anti-infectives: Anti-infectives (From admission, onward)   Start     Dose/Rate Route Frequency Ordered Stop   08/04/17 2000  ciprofloxacin (CIPRO) tablet 500 mg  Status:  Discontinued     500 mg Oral 2 times daily 08/04/17 1516 08/06/17 1649   08/04/17 1600  metroNIDAZOLE (FLAGYL) tablet 500 mg  Status:  Discontinued     500 mg Oral Every 8 hours 08/04/17 1516 08/06/17 1649   08/01/17 1700  metroNIDAZOLE (FLAGYL) IVPB 500 mg  Status:  Discontinued     500 mg 100 mL/hr over 60 Minutes Intravenous Every 8 hours 08/01/17 1630 08/04/17 1516   08/01/17 1415  ciprofloxacin (CIPRO) IVPB 400 mg     400 mg 200 mL/hr over 60 Minutes Intravenous  Once 08/01/17 1411 08/01/17 1712   08/01/17 1400  ciprofloxacin (CIPRO) IVPB 400 mg  Status:  Discontinued     400 mg 200 mL/hr over 60 Minutes Intravenous Every 12 hours 08/01/17 1403 08/04/17 1516   08/01/17 1330  metroNIDAZOLE (FLAGYL) IVPB 500 mg  Status:  Discontinued     500 mg 100 mL/hr over 60 Minutes Intravenous  Once 08/01/17 1315  08/01/17 1821   08/01/17 1315  ceFEPIme (MAXIPIME) 2 g in dextrose 5 % 50 mL IVPB  Status:  Discontinued     2 g 100 mL/hr over 30 Minutes Intravenous  Once 08/01/17 1315 08/01/17 1403       Assessment/Plan  COPD with tobacco use ABL anemia RLS Hx of bladder cancer with left kidney cancer - s/p left nephrectomy and bladder surgery  UGI Bleed with multiple nonbleeding duodenal ulcers - path shows peptic duodenitis, no malignancy  - this does not require resection at this time, if she were to require some sort of resection in the future that would mean a Whipple procedure - recommend high dose PPI/carafate per GI  FEN: SOFT  VTE: SCDs ID: cefepime 12/14, cipro/flagyl 12/14>12/19  Patient going home when tolerating diet and follow up with GI. We will sign off, call with questions or concerns.   LOS: 7  days    Brigid Re , [facility] 08/08/2017, 7:59 AM Pager: [phone] Consults: [phone] Mon-Fri 7:00 am-4:30 pm Sat-Sun 7:00 am-11:30 am

## 2017-08-10 ENCOUNTER — Encounter (HOSPITAL_COMMUNITY): Payer: Self-pay | Admitting: Gastroenterology

## 2017-10-06 ENCOUNTER — Emergency Department (HOSPITAL_COMMUNITY): Payer: Medicare Other

## 2017-10-06 ENCOUNTER — Encounter (HOSPITAL_COMMUNITY): Payer: Self-pay | Admitting: Emergency Medicine

## 2017-10-06 ENCOUNTER — Other Ambulatory Visit: Payer: Self-pay

## 2017-10-06 ENCOUNTER — Inpatient Hospital Stay (HOSPITAL_COMMUNITY)
Admission: EM | Admit: 2017-10-06 | Discharge: 2017-10-12 | DRG: 377 | Disposition: A | Discharge status: Home / Self Care | Payer: Medicare Other | Attending: Internal Medicine | Admitting: Internal Medicine

## 2017-10-06 DIAGNOSIS — Z905 Acquired absence of kidney: Secondary | ICD-10-CM

## 2017-10-06 DIAGNOSIS — K264 Chronic or unspecified duodenal ulcer with hemorrhage: Secondary | ICD-10-CM | POA: Diagnosis not present

## 2017-10-06 DIAGNOSIS — F419 Anxiety disorder, unspecified: Secondary | ICD-10-CM | POA: Diagnosis present

## 2017-10-06 DIAGNOSIS — J189 Pneumonia, unspecified organism: Secondary | ICD-10-CM | POA: Diagnosis present

## 2017-10-06 DIAGNOSIS — Z681 Body mass index (BMI) 19 or less, adult: Secondary | ICD-10-CM

## 2017-10-06 DIAGNOSIS — Y92009 Unspecified place in unspecified non-institutional (private) residence as the place of occurrence of the external cause: Secondary | ICD-10-CM

## 2017-10-06 DIAGNOSIS — E440 Moderate protein-calorie malnutrition: Secondary | ICD-10-CM | POA: Diagnosis present

## 2017-10-06 DIAGNOSIS — K529 Noninfective gastroenteritis and colitis, unspecified: Secondary | ICD-10-CM | POA: Diagnosis present

## 2017-10-06 DIAGNOSIS — T471X6A Underdosing of other antacids and anti-gastric-secretion drugs, initial encounter: Secondary | ICD-10-CM | POA: Diagnosis present

## 2017-10-06 DIAGNOSIS — E785 Hyperlipidemia, unspecified: Secondary | ICD-10-CM | POA: Diagnosis present

## 2017-10-06 DIAGNOSIS — D500 Iron deficiency anemia secondary to blood loss (chronic): Secondary | ICD-10-CM | POA: Diagnosis present

## 2017-10-06 DIAGNOSIS — D649 Anemia, unspecified: Secondary | ICD-10-CM | POA: Diagnosis not present

## 2017-10-06 DIAGNOSIS — K269 Duodenal ulcer, unspecified as acute or chronic, without hemorrhage or perforation: Secondary | ICD-10-CM | POA: Diagnosis present

## 2017-10-06 DIAGNOSIS — F17210 Nicotine dependence, cigarettes, uncomplicated: Secondary | ICD-10-CM | POA: Diagnosis present

## 2017-10-06 DIAGNOSIS — K254 Chronic or unspecified gastric ulcer with hemorrhage: Secondary | ICD-10-CM | POA: Diagnosis present

## 2017-10-06 DIAGNOSIS — D62 Acute posthemorrhagic anemia: Secondary | ICD-10-CM | POA: Diagnosis present

## 2017-10-06 DIAGNOSIS — K449 Diaphragmatic hernia without obstruction or gangrene: Secondary | ICD-10-CM | POA: Diagnosis present

## 2017-10-06 DIAGNOSIS — Z91120 Patient's intentional underdosing of medication regimen due to financial hardship: Secondary | ICD-10-CM

## 2017-10-06 DIAGNOSIS — R109 Unspecified abdominal pain: Secondary | ICD-10-CM

## 2017-10-06 DIAGNOSIS — R110 Nausea: Secondary | ICD-10-CM

## 2017-10-06 DIAGNOSIS — J440 Chronic obstructive pulmonary disease with acute lower respiratory infection: Secondary | ICD-10-CM | POA: Diagnosis present

## 2017-10-06 DIAGNOSIS — F329 Major depressive disorder, single episode, unspecified: Secondary | ICD-10-CM | POA: Diagnosis present

## 2017-10-06 HISTORY — DX: Chronic obstructive pulmonary disease, unspecified: J44.9

## 2017-10-06 LAB — CBC
HCT: 24.9 % — ABNORMAL LOW (ref 36.0–46.0)
Hemoglobin: 7.2 g/dL — ABNORMAL LOW (ref 12.0–15.0)
MCH: 19.8 pg — ABNORMAL LOW (ref 26.0–34.0)
MCHC: 28.9 g/dL — ABNORMAL LOW (ref 30.0–36.0)
MCV: 68.4 fL — ABNORMAL LOW (ref 78.0–100.0)
Platelets: 346 10*3/uL (ref 150–400)
RBC: 3.64 MIL/uL — ABNORMAL LOW (ref 3.87–5.11)
RDW: 17.9 % — ABNORMAL HIGH (ref 11.5–15.5)
WBC: 9.6 10*3/uL (ref 4.0–10.5)

## 2017-10-06 LAB — PREPARE RBC (CROSSMATCH)

## 2017-10-06 LAB — COMPREHENSIVE METABOLIC PANEL
ALT: 10 U/L — ABNORMAL LOW (ref 14–54)
AST: 16 U/L (ref 15–41)
Albumin: 3.6 g/dL (ref 3.5–5.0)
Alkaline Phosphatase: 118 U/L (ref 38–126)
Anion gap: 10 (ref 5–15)
BUN: 14 mg/dL (ref 6–20)
CO2: 19 mmol/L — ABNORMAL LOW (ref 22–32)
Calcium: 9.2 mg/dL (ref 8.9–10.3)
Chloride: 109 mmol/L (ref 101–111)
Creatinine, Ser: 0.89 mg/dL (ref 0.44–1.00)
GFR calc Af Amer: >60 mL/min (ref >60)
GFR calc non Af Amer: >60 mL/min (ref >60)
Glucose, Bld: 111 mg/dL — ABNORMAL HIGH (ref 65–99)
Potassium: 3.6 mmol/L (ref 3.5–5.1)
Sodium: 138 mmol/L (ref 135–145)
Total Bilirubin: 0.3 mg/dL (ref 0.3–1.2)
Total Protein: 7 g/dL (ref 6.5–8.1)

## 2017-10-06 LAB — LIPASE, BLOOD: Lipase: 21 U/L (ref 11–51)

## 2017-10-06 LAB — POC OCCULT BLOOD, ED: FECAL OCCULT BLD: NEGATIVE

## 2017-10-06 MED ORDER — SODIUM CHLORIDE 0.9 % IV SOLN
80.0000 mg | Freq: Once | INTRAVENOUS | Status: AC
  Administered 2017-10-06: 80 mg via INTRAVENOUS
  Filled 2017-10-06: qty 80

## 2017-10-06 MED ORDER — SODIUM CHLORIDE 0.9 % IV SOLN
Freq: Once | INTRAVENOUS | Status: AC
  Administered 2017-10-07: 02:00:00 via INTRAVENOUS

## 2017-10-06 MED ORDER — MORPHINE SULFATE (PF) 4 MG/ML IV SOLN
4.0000 mg | Freq: Once | INTRAVENOUS | Status: AC
  Administered 2017-10-06: 4 mg via INTRAVENOUS
  Filled 2017-10-06: qty 1

## 2017-10-06 MED ORDER — LORAZEPAM 2 MG/ML IJ SOLN
0.5000 mg | Freq: Once | INTRAMUSCULAR | Status: AC
  Administered 2017-10-06: 0.5 mg via INTRAVENOUS
  Filled 2017-10-06: qty 1

## 2017-10-06 NOTE — ED Notes (Signed)
Patient transported to X-ray 

## 2017-10-06 NOTE — ED Notes (Signed)
Bed: WA17 Expected date:  Expected time:  Means of arrival:  Comments: Res A 

## 2017-10-06 NOTE — ED Notes (Signed)
ED Provider at bedside. 

## 2017-10-06 NOTE — ED Provider Notes (Signed)
[facility] DEPT Provider Note   CSN: [id number] Arrival date & time: 10/06/17  1818     History   Chief Complaint Chief Complaint  Patient presents with  . Abdominal Pain  . Diarrhea  . Emesis    HPI Erica York is a 77 y.o. female.  HPI   77 year old female with dyspnea and generalized weakness.  Progressively worsening over the past few days.  She chronically feels dyspneic.  She does have a past history of COPD.  She has not been getting much relief with albuterol like she typically does.  Occasional cough.  Nonproductive.  No fevers but has felt chilled.  She is also complaining of epigastric pain.  She questions whether it may be from an ulcer.  She does have a history of peptic ulcer disease.  December for the same.  She was discharged on a PPI.  She states that she only took it for about a month until running out has not had the financial means to refill it.  She also reports that she regularly takes some type of over-the-counter pain medication.  She states the box says "aspirin free" but she is unsure of the ingredients otherwise.  Past Medical History:  Diagnosis Date  . Anxiety   . COPD (chronic obstructive pulmonary disease) ([place])   . Depression   . Hyperlipidemia     Patient Active Problem List   Diagnosis Date Noted  . Protein-calorie malnutrition, severe 08/07/2017  . Duodenitis 08/03/2017  . Blood loss anemia 08/03/2017    Past Surgical History:  Procedure Laterality Date  . ABDOMINAL HYSTERECTOMY    . APPENDECTOMY    . BLADDER SURGERY     REMOVAL OF TUMORS  . CHOLECYSTECTOMY    . COLOSTOMY/REATTACHMENT    . ESOPHAGOGASTRODUODENOSCOPY N/A 08/06/2017   Procedure: ESOPHAGOGASTRODUODENOSCOPY (EGD);  Surgeon: Clarene Essex, MD;  Location: [facility];  Service: Endoscopy;  Laterality: N/A;  . LEFT NEPHRECTOMY      OB History    No data available       Home Medications    Prior to Admission medications     Medication Sig Start Date End Date Taking? Authorizing Provider  ALPRAZolam Duanne Moron) 0.5 MG tablet Take 1 tablet (0.5 mg total) by mouth 3 (three) times daily as needed for anxiety. 08/08/17   Caren Griffins, MD  colesevelam (WELCHOL) 625 MG tablet Take 625 mg by mouth daily.    [provider]  pantoprazole (PROTONIX) 40 MG tablet Take 1 tablet (40 mg total) by mouth 2 (two) times daily before a meal. After 1 month take 1 daily in the morning 08/08/17   Caren Griffins, MD  potassium chloride SA (K-DUR,KLOR-CON) 20 MEQ tablet Take 20 mEq by mouth 2 (two) times daily.    [provider]  rOPINIRole (REQUIP) 1 MG tablet Take 1 tablet (1 mg total) by mouth at bedtime as needed (Sleep). 08/08/17   Caren Griffins, MD  sertraline (ZOLOFT) 100 MG tablet Take 1 tablet (100 mg total) by mouth at bedtime. 08/08/17   Caren Griffins, MD    Family History No family history on file.  Social History Social History   Tobacco Use  . Smoking status: Current Some Day Smoker    Types: Cigarettes  . Smokeless tobacco: Never Used  Substance Use Topics  . Alcohol use: No    Frequency: Never  . Drug use: No     Allergies   Zosyn [piperacillin sod-tazobactam so]  Review of Systems Review of Systems   All systems reviewed and negative, other than as noted in HPI.   Physical Exam Updated Vital Signs BP (!) 143/85 (BP Location: Left Arm)   Pulse (!) 117   Temp 98.8 F (37.1 C) (Oral)   Resp (!) 26   Ht 5' (1.524 m)   Wt 44.7 kg (98 lb 8 oz)   SpO2 92%   BMI 19.24 kg/m   Physical Exam  Constitutional: She appears well-developed and well-nourished. No distress.  HENT:  Head: Normocephalic and atraumatic.  Eyes: Conjunctivae are normal. Right eye exhibits no discharge. Left eye exhibits no discharge.  Neck: Neck supple.  Cardiovascular: Normal rate, regular rhythm and normal heart sounds. Exam reveals no gallop and no friction rub.  No murmur  heard. Pulmonary/Chest: Effort normal and breath sounds normal. No respiratory distress.  Abdominal: Soft. She exhibits no distension. There is tenderness.  Epigastric tenderness without rebound or guarding.  Musculoskeletal: She exhibits no edema or tenderness.  Neurological: She is alert.  Skin: Skin is warm and dry.  Psychiatric: She has a normal mood and affect. Her behavior is normal. Thought content normal.  Nursing note and vitals reviewed.    ED Treatments / Results  Labs (all labs ordered are listed, but only abnormal results are displayed) Labs Reviewed  COMPREHENSIVE METABOLIC PANEL - Abnormal; Notable for the following components:      Result Value   CO2 19 (*)    Glucose, Bld 111 (*)    ALT 10 (*)    All other components within normal limits  CBC - Abnormal; Notable for the following components:   RBC 3.64 (*)    Hemoglobin 7.2 (*)    HCT 24.9 (*)    MCV 68.4 (*)    MCH 19.8 (*)    MCHC 28.9 (*)    RDW 17.9 (*)    All other components within normal limits  LIPASE, BLOOD  URINALYSIS, ROUTINE W REFLEX MICROSCOPIC  OCCULT BLOOD X 1 CARD TO LAB, STOOL  TYPE AND SCREEN  PREPARE RBC (CROSSMATCH)    EKG  EKG Interpretation None       Radiology No results found.  Procedures Procedures (including critical care time) CRITICAL CARE Performed by: Virgel Manifold Total critical care time: 35 minutes Critical care time was exclusive of separately billable procedures and treating other patients. Critical care was necessary to treat or prevent imminent or life-threatening deterioration. Critical care was time spent personally by me on the following activities: development of treatment plan with patient and/or surrogate as well as nursing, discussions with consultants, evaluation of patient's response to treatment, examination of patient, obtaining history from patient or surrogate, ordering and performing treatments and interventions, ordering and review of  laboratory studies, ordering and review of radiographic studies, pulse oximetry and re-evaluation of patient's condition.   Medications Ordered in ED Medications - No data to display   Initial Impression / Assessment and Plan / ED Course  I have reviewed the triage vital signs and the nursing notes.  Pertinent labs & imaging results that were available during my care of the patient were reviewed by me and considered in my medical decision making (see chart for details).     77 year old female with epigastric pain, dyspnea and generalized weakness.  Suspect secondary to symptomatic anemia likely from upper GI bleed. BUN normal though. History of the same.  Epigastric pain. She has been off her PPI for over a month.  Possibly taking  an NSAID on a regular basis.  Will check stool for occult blood. Transfuse 1u PRBC.    Final Clinical Impressions(s) / ED Diagnoses   Final diagnoses:  Symptomatic anemia    ED Discharge Orders    None       Virgel Manifold, MD 10/06/17 2234

## 2017-10-06 NOTE — ED Notes (Signed)
Bed: RESA Expected date:  Expected time:  Means of arrival:  Comments: Hold if needed for critical patient

## 2017-10-06 NOTE — ED Triage Notes (Signed)
Patient reports that she has n/v/d for 5 days.

## 2017-10-06 NOTE — ED Triage Notes (Signed)
Per [facility] pt from home for abd pain that gotten worse over past 3 days. Patient has known ulcers. Patient reports that she has exertional dyspnea for 3 days as well. Patient has COPD.

## 2017-10-06 NOTE — ED Triage Notes (Signed)
Pt's O2 is in the 80's and her hemoglobin is 7.2

## 2017-10-07 DIAGNOSIS — Z681 Body mass index (BMI) 19 or less, adult: Secondary | ICD-10-CM | POA: Diagnosis not present

## 2017-10-07 DIAGNOSIS — J189 Pneumonia, unspecified organism: Secondary | ICD-10-CM | POA: Diagnosis present

## 2017-10-07 DIAGNOSIS — E440 Moderate protein-calorie malnutrition: Secondary | ICD-10-CM

## 2017-10-07 DIAGNOSIS — K449 Diaphragmatic hernia without obstruction or gangrene: Secondary | ICD-10-CM | POA: Diagnosis present

## 2017-10-07 DIAGNOSIS — D500 Iron deficiency anemia secondary to blood loss (chronic): Secondary | ICD-10-CM | POA: Diagnosis present

## 2017-10-07 DIAGNOSIS — F329 Major depressive disorder, single episode, unspecified: Secondary | ICD-10-CM | POA: Diagnosis present

## 2017-10-07 DIAGNOSIS — F17210 Nicotine dependence, cigarettes, uncomplicated: Secondary | ICD-10-CM | POA: Diagnosis present

## 2017-10-07 DIAGNOSIS — K269 Duodenal ulcer, unspecified as acute or chronic, without hemorrhage or perforation: Secondary | ICD-10-CM | POA: Diagnosis present

## 2017-10-07 DIAGNOSIS — J440 Chronic obstructive pulmonary disease with acute lower respiratory infection: Secondary | ICD-10-CM | POA: Diagnosis present

## 2017-10-07 DIAGNOSIS — T471X6A Underdosing of other antacids and anti-gastric-secretion drugs, initial encounter: Secondary | ICD-10-CM | POA: Diagnosis present

## 2017-10-07 DIAGNOSIS — Z905 Acquired absence of kidney: Secondary | ICD-10-CM | POA: Diagnosis not present

## 2017-10-07 DIAGNOSIS — D649 Anemia, unspecified: Secondary | ICD-10-CM | POA: Diagnosis present

## 2017-10-07 DIAGNOSIS — Z91120 Patient's intentional underdosing of medication regimen due to financial hardship: Secondary | ICD-10-CM | POA: Diagnosis not present

## 2017-10-07 DIAGNOSIS — J181 Lobar pneumonia, unspecified organism: Secondary | ICD-10-CM | POA: Diagnosis not present

## 2017-10-07 DIAGNOSIS — E785 Hyperlipidemia, unspecified: Secondary | ICD-10-CM | POA: Diagnosis present

## 2017-10-07 DIAGNOSIS — K254 Chronic or unspecified gastric ulcer with hemorrhage: Secondary | ICD-10-CM | POA: Diagnosis present

## 2017-10-07 DIAGNOSIS — F419 Anxiety disorder, unspecified: Secondary | ICD-10-CM | POA: Diagnosis present

## 2017-10-07 DIAGNOSIS — Y92009 Unspecified place in unspecified non-institutional (private) residence as the place of occurrence of the external cause: Secondary | ICD-10-CM | POA: Diagnosis not present

## 2017-10-07 DIAGNOSIS — K264 Chronic or unspecified duodenal ulcer with hemorrhage: Secondary | ICD-10-CM | POA: Diagnosis present

## 2017-10-07 DIAGNOSIS — D62 Acute posthemorrhagic anemia: Secondary | ICD-10-CM | POA: Diagnosis present

## 2017-10-07 DIAGNOSIS — K529 Noninfective gastroenteritis and colitis, unspecified: Secondary | ICD-10-CM | POA: Diagnosis present

## 2017-10-07 DIAGNOSIS — K279 Peptic ulcer, site unspecified, unspecified as acute or chronic, without hemorrhage or perforation: Secondary | ICD-10-CM | POA: Diagnosis not present

## 2017-10-07 LAB — CBC
HEMATOCRIT: 26.7 % — AB (ref 36.0–46.0)
Hemoglobin: 7.9 g/dL — ABNORMAL LOW (ref 12.0–15.0)
MCH: 21.3 pg — AB (ref 26.0–34.0)
MCHC: 29.6 g/dL — ABNORMAL LOW (ref 30.0–36.0)
MCV: 72 fL — AB (ref 78.0–100.0)
PLATELETS: 283 10*3/uL (ref 150–400)
RBC: 3.71 MIL/uL — ABNORMAL LOW (ref 3.87–5.11)
RDW: 19.6 % — AB (ref 11.5–15.5)
WBC: 9.1 10*3/uL (ref 4.0–10.5)

## 2017-10-07 MED ORDER — ACETAMINOPHEN 325 MG PO TABS: 650.0000 mg | ORAL_TABLET | Freq: Four times a day (QID) | ORAL | Status: DC | PRN

## 2017-10-07 MED ORDER — LORAZEPAM 0.5 MG PO TABS
0.5000 mg | ORAL_TABLET | Freq: Once | ORAL | Status: AC
  Administered 2017-10-07: 0.5 mg via ORAL
  Filled 2017-10-07: qty 1

## 2017-10-07 MED ORDER — LEVOFLOXACIN IN D5W 750 MG/150ML IV SOLN
750.0000 mg | INTRAVENOUS | Status: DC
  Administered 2017-10-07 – 2017-10-09 (×2): 750 mg via INTRAVENOUS
  Filled 2017-10-07 (×2): qty 150

## 2017-10-07 MED ORDER — MORPHINE SULFATE (PF) 4 MG/ML IV SOLN
2.0000 mg | INTRAVENOUS | Status: DC | PRN
  Administered 2017-10-07 – 2017-10-12 (×9): 2 mg via INTRAVENOUS
  Filled 2017-10-07 (×9): qty 1

## 2017-10-07 MED ORDER — SODIUM CHLORIDE 0.9 % IV SOLN
INTRAVENOUS | Status: DC
  Administered 2017-10-07: 23:00:00 via INTRAVENOUS

## 2017-10-07 MED ORDER — OXYCODONE HCL 5 MG PO TABS
5.0000 mg | ORAL_TABLET | ORAL | Status: DC | PRN
  Administered 2017-10-07 – 2017-10-12 (×7): 5 mg via ORAL
  Filled 2017-10-07 (×7): qty 1

## 2017-10-07 MED ORDER — ACETAMINOPHEN 325 MG PO TABS
650.0000 mg | ORAL_TABLET | Freq: Four times a day (QID) | ORAL | Status: DC | PRN
  Administered 2017-10-07 – 2017-10-08 (×3): 650 mg via ORAL
  Filled 2017-10-07 (×3): qty 2

## 2017-10-07 MED ORDER — OXYCODONE HCL 5 MG PO TABS
10.0000 mg | ORAL_TABLET | ORAL | Status: DC | PRN
  Administered 2017-10-07 – 2017-10-09 (×7): 10 mg via ORAL
  Filled 2017-10-07 (×7): qty 2

## 2017-10-07 MED ORDER — ONDANSETRON HCL 4 MG/2ML IJ SOLN
4.0000 mg | Freq: Four times a day (QID) | INTRAMUSCULAR | Status: DC | PRN
  Administered 2017-10-07 – 2017-10-11 (×3): 4 mg via INTRAVENOUS
  Filled 2017-10-07 (×3): qty 2

## 2017-10-07 MED ORDER — IPRATROPIUM-ALBUTEROL 0.5-2.5 (3) MG/3ML IN SOLN
3.0000 mL | Freq: Three times a day (TID) | RESPIRATORY_TRACT | Status: DC | PRN
  Administered 2017-10-08: 3 mL via RESPIRATORY_TRACT
  Filled 2017-10-07: qty 3

## 2017-10-07 MED ORDER — MORPHINE SULFATE (PF) 4 MG/ML IV SOLN
2.0000 mg | Freq: Once | INTRAVENOUS | Status: AC
  Administered 2017-10-07: 2 mg via INTRAVENOUS
  Filled 2017-10-07: qty 1

## 2017-10-07 MED ORDER — SODIUM CHLORIDE 0.9 % IV SOLN
8.0000 mg/h | INTRAVENOUS | Status: DC
  Administered 2017-10-07 – 2017-10-09 (×6): 8 mg/h via INTRAVENOUS
  Filled 2017-10-07 (×8): qty 80

## 2017-10-07 NOTE — H&P (Signed)
History and Physical    Skyylar Kopf GBT:[id number] DOB: 03-11-1941 DOA: 10/06/2017  PCP: System, Pcp Not In   Patient coming from: Home   Chief Complaint: Abdominal pain, cough, SOB.  HPI: Erica York is a 77 y.o. female with medical history significant for COPD, duodenal ulcers, depression, who presented to the ED with complaints of abdominal pain, generalized weakness and difficulty breathing ~4 days.  Patient reports also a productive cough- ~4 days.  No fevers.  Patient also reports intermittent dark stools annd 4 episodes of loose stools yesterday significantly changed from baseline , she has chronic diarrhea.  She reports 2 episodes of bloody vomitus yesterday.  Patient recently had a prolonged hospital admission 08/01/17- 08/08/17, managed for upper GI bleed, requiring ICU admission with Hgb 6 with hypotension.  EGD showed few nonbleeding cratered duodenal ulcers with no stigmata of bleeding in the duodenum, large ulcerated mass with no bleeding in first part of duodenum, biopsy showed peptic duodenitis with ulceration and erosion, also with chronic gastritis, no malignancy.  She has had a history of significant NSAID use.  She was discharged home to take Protonix twice daily, she was given a month's prescription, Patient today denies NSAID use, but reports she ran out of her Protonix after a month.  ED Course: Mild intermittent tachycardia to 117, otherwise stable vitals.  Hgb was low at 7.2, was 9.1 on last discharge. WBC- 9.6. Chest x-ray showed increased airspace disease at the left lung base suspicious for pneumonia.  Patient was given 80 mg Protonix bolus, 1u of blood was ordered for transfusion.  Hospitalist was called to admit for anemia secondary to GI blood loss.   Review of Systems: As per HPI otherwise 10 point review of systems negative.   Past Medical History:  Diagnosis Date  . Anxiety   . COPD (chronic obstructive pulmonary disease) ([place])   . Depression   .  Hyperlipidemia     Past Surgical History:  Procedure Laterality Date  . ABDOMINAL HYSTERECTOMY    . APPENDECTOMY    . BLADDER SURGERY     REMOVAL OF TUMORS  . CHOLECYSTECTOMY    . COLOSTOMY/REATTACHMENT    . ESOPHAGOGASTRODUODENOSCOPY N/A 08/06/2017   Procedure: ESOPHAGOGASTRODUODENOSCOPY (EGD);  Surgeon: Clarene Essex, MD;  Location: [facility];  Service: Endoscopy;  Laterality: N/A;  . LEFT NEPHRECTOMY       reports that she has been smoking cigarettes.  she has never used smokeless tobacco. She reports that she does not drink alcohol or use drugs.  Allergies  Allergen Reactions  . Zosyn [Piperacillin Sod-Tazobactam So] Swelling and Rash    No family history on file.  Prior to Admission medications   Medication Sig Start Date End Date Taking? Authorizing Provider  ALPRAZolam Duanne Moron) 0.5 MG tablet Take 1 tablet (0.5 mg total) by mouth 3 (three) times daily as needed for anxiety. 08/08/17  Yes Caren Griffins, MD  colesevelam (WELCHOL) 625 MG tablet Take 625 mg by mouth daily.   Yes [provider]  pantoprazole (PROTONIX) 40 MG tablet Take 1 tablet (40 mg total) by mouth 2 (two) times daily before a meal. After 1 month take 1 daily in the morning 08/08/17  Yes Gherghe, Vella Redhead, MD  potassium chloride SA (K-DUR,KLOR-CON) 20 MEQ tablet Take 20 mEq by mouth 2 (two) times daily.   Yes [provider]  rOPINIRole (REQUIP) 1 MG tablet Take 1 tablet (1 mg total) by mouth at bedtime as needed (Sleep). 08/08/17  Yes Gherghe, Costin  M, MD  sertraline (ZOLOFT) 100 MG tablet Take 1 tablet (100 mg total) by mouth at bedtime. 08/08/17  Yes Caren Griffins, MD    Physical Exam: Vitals:   10/07/17 0000 10/07/17 0054 10/07/17 0135 10/07/17 0153  BP: 132/64 135/83 124/64 125/68  Pulse: 96 (!) 102 93 (!) 102  Resp: 17 20 20 18   Temp:  98.5 F (36.9 C) 98.6 F (37 C) 98.9 F (37.2 C)  TempSrc:  Oral Oral Oral  SpO2: 100% 97% 93% 95%  Weight:  43.4 kg (95 lb 10.9 oz)     Height:  5\' 1"  (1.549 m)      Constitutional: NAD, calm, comfortable, sleeping but arousable Vitals:   10/07/17 0000 10/07/17 0054 10/07/17 0135 10/07/17 0153  BP: 132/64 135/83 124/64 125/68  Pulse: 96 (!) 102 93 (!) 102  Resp: 17 20 20 18   Temp:  98.5 F (36.9 C) 98.6 F (37 C) 98.9 F (37.2 C)  TempSrc:  Oral Oral Oral  SpO2: 100% 97% 93% 95%  Weight:  43.4 kg (95 lb 10.9 oz)    Height:  5\' 1"  (1.549 m)     Eyes: PERRL, lids and conjunctivae normal ENMT: Mucous membranes are mildly dry.  Neck: normal, supple, no masses, no thyromegaly Respiratory: clear to auscultation bilaterally, no wheezing, no crackles. Normal respiratory effort. No accessory muscle use.  Cardiovascular: Regular rate and rhythm, no murmurs / rubs / gallops. No extremity edema. 2+ pedal pulses.   Abdomen: Abdominal tenderness, some epigastric region, soft, no masses palpated. No hepatosplenomegaly. Bowel sounds positive.  Musculoskeletal: no clubbing / cyanosis.  Diffuse arthritic changes in most joints. Good ROM, no contractures. Normal muscle tone.  Skin: no rashes, lesions, ulcers. No induration Neurologic: CN 2-12 grossly intact. Strength 5/5 in all 4.  Psychiatric: Normal judgment and insight. Alert and oriented x 3. Normal mood.    Labs on Admission: I have personally reviewed following labs and imaging studies  CBC: Recent Labs  Lab 10/06/17 1834  WBC 9.6  HGB 7.2*  HCT 24.9*  MCV 68.4*  PLT 093   Basic Metabolic Panel: Recent Labs  Lab 10/06/17 1834  NA 138  K 3.6  CL 109  CO2 19*  GLUCOSE 111*  BUN 14  CREATININE 0.89  CALCIUM 9.2   Liver Function Tests: Recent Labs  Lab 10/06/17 1834  AST 16  ALT 10*  ALKPHOS 118  BILITOT 0.3  PROT 7.0  ALBUMIN 3.6   Recent Labs  Lab 10/06/17 1834  LIPASE 21   Urine analysis:    Component Value Date/Time   COLORURINE STRAW (A) 08/01/2017 0838   APPEARANCEUR CLEAR 08/01/2017 0838   LABSPEC 1.011 08/01/2017 0838   PHURINE  6.0 08/01/2017 0838   GLUCOSEU NEGATIVE 08/01/2017 0838   HGBUR NEGATIVE 08/01/2017 0838   BILIRUBINUR NEGATIVE 08/01/2017 0838   KETONESUR NEGATIVE 08/01/2017 0838   PROTEINUR NEGATIVE 08/01/2017 0838   NITRITE NEGATIVE 08/01/2017 0838   LEUKOCYTESUR TRACE (A) 08/01/2017 0838    Radiological Exams on Admission: Dg Chest 2 View  Result Date: 10/06/2017 CLINICAL DATA:  Dyspnea EXAM: CHEST  2 VIEW COMPARISON:  08/01/2017 FINDINGS: Hyperinflation. Diffuse reticular opacity consistent with chronic interstitial disease. Left lung base more focal opacity. No significant pleural effusion. Stable cardiomediastinal silhouette with aortic atherosclerosis. No pneumothorax. IMPRESSION: 1. Hyperinflation with diffuse reticulation consistent with emphysematous disease and chronic interstitial changes 2. Increased airspace disease at the left lung base, suspicious for a pneumonia. Imaging follow-up to resolution is  recommended Electronically Signed   By: Donavan Foil M.D.   On: 10/06/2017 22:49    EKG: None   Assessment/Plan Principal Problem:   Anemia due to GI blood loss Active Problems:   Symptomatic anemia   Duodenal ulcer  Anemia due to GI blood loss- likely from prior NSAID use. Hgb 7.2. 9.1 on discharge- 08/07/17. EGD- by Dr. Watt Climes, vaginal bleeding ulcerated mass and cratered nonbleeding ulcers in duodenum, PAth- No metaplasia or H. pylori identified. Pt was to follow up with [facility] 2-4 weeks for follow up EGD and colonoscopy.  Noncompliance with Protonix due to financial problems. - Consult [facility] a.m - IV Protonix bolus 80 mg already given in ED, will start Protonix drip - CBC a.m after 1unit of blood transfused. - Social work consult for meds placed in ED -Zofran prn - NPO  Duodenal ulcer- see above, also with ?  Partial duodenal obstruction. She reports vomiting and chronic loose stools.   Pneumonia- productive cough, SOB ( likely anemia contributing). WBC- 9.6. No fevers.   Hospital admission 12/14. Chest Xray-increased airspace disease left lung base suspicious for pneumonia. - Will start IV levaquin per pharmacy consult. - blood cultures X 2  COPD- appears stable.  - Duonebs PRN   DVT prophylaxis:SCDS Code Status: Full Family Communication: None at bedside Disposition Plan: 2-3 days Consults called: GI eval A.m Admission status: Obs, tele    Bethena Roys MD [facility] Pager [phone]  If 6PM-3AM, please contact night-coverage [email or web address] Password [facility]  10/07/2017, 3:36 AM

## 2017-10-07 NOTE — Evaluation (Signed)
Physical Therapy Evaluation Patient Details Name: Erica York MRN: [id number] DOB: 09/10/1940 Today's Date: 10/07/2017   History of Present Illness  77 yo female admitted with anemia, dyspnea. Hx of COPD, anxiety, RLS, duodenal ulcers, hernia repair, UGIB  Clinical Impression  On eval, pt was Min guard assist for mobility. She walked ~160 feet while holding on to the IV pole. Pt declined RW use on today. She fatigues fairly easily. She did c/o lightheadedness. Will continue to follow and progress activity as tolerated. Encouraged pt to use RW here, and at home, as needed.     Follow Up Recommendations Supervision for mobility/OOB    Equipment Recommendations  None recommended by PT    Recommendations for Other Services       Precautions / Restrictions Precautions Precautions: Fall Restrictions Weight Bearing Restrictions: No      Mobility  Bed Mobility Overal bed mobility: Modified Independent                Transfers Overall transfer level: Needs assistance   Transfers: Sit to/from Stand Sit to Stand: Supervision         General transfer comment: for safety  Ambulation/Gait Ambulation/Gait assistance: Min guard Ambulation Distance (Feet): 160 Feet Assistive device: (IV pole) Gait Pattern/deviations: Step-through pattern;Decreased stride length     General Gait Details: Pt held on to IV pole with both hands. She declined RW use. dyspnea 2/4.   Stairs            Wheelchair Mobility    Modified Rankin (Stroke Patients Only)       Balance Overall balance assessment: Needs assistance           Standing balance-Leahy Scale: Fair                               Pertinent Vitals/Pain Pain Assessment: No/denies pain    Home Living Family/patient expects to be discharged to:: Private residence Living Arrangements: Children(son)   Type of Home: House         Home Equipment: Environmental consultant - 4 wheels      Prior Function Level  of Independence: Independent with assistive device(s)         Comments: uses rollator PRN     Hand Dominance        Extremity/Trunk Assessment   Upper Extremity Assessment Upper Extremity Assessment: Overall WFL for tasks assessed    Lower Extremity Assessment Lower Extremity Assessment: Generalized weakness    Cervical / Trunk Assessment Cervical / Trunk Assessment: Normal  Communication   Communication: No difficulties  Cognition Arousal/Alertness: Awake/alert Behavior During Therapy: WFL for tasks assessed/performed Overall Cognitive Status: Within Functional Limits for tasks assessed                                        General Comments      Exercises     Assessment/Plan    PT Assessment Patient needs continued PT services  PT Problem List Decreased mobility;Decreased activity tolerance;Decreased balance       PT Treatment Interventions DME instruction;Gait training;Functional mobility training;Therapeutic activities;Balance training;Therapeutic exercise;Patient/family education    PT Goals (Current goals can be found in the Care Plan section)  Acute Rehab PT Goals Patient Stated Goal: none stated PT Goal Formulation: With patient Time For Goal Achievement: 10/21/17 Potential to Achieve Goals: Good  Frequency Min 3X/week   Barriers to discharge        Co-evaluation               AM-PAC PT "6 Clicks" Daily Activity  Outcome Measure Difficulty turning over in bed (including adjusting bedclothes, sheets and blankets)?: None Difficulty moving from lying on back to sitting on the side of the bed? : None Difficulty sitting down on and standing up from a chair with arms (e.g., wheelchair, bedside commode, etc,.)?: None Help needed moving to and from a bed to chair (including a wheelchair)?: A Little Help needed walking in hospital room?: A Little Help needed climbing 3-5 steps with a railing? : A Little 6 Click Score:  21    End of Session Equipment Utilized During Treatment: Gait belt Activity Tolerance: Patient limited by fatigue Patient left: in bed;with call bell/phone within reach   PT Visit Diagnosis: Difficulty in walking, not elsewhere classified (R26.2)    Time: 7972-8206 PT Time Calculation (min) (ACUTE ONLY): 15 min   Charges:   PT Evaluation $PT Eval Moderate Complexity: 1 Mod     PT G Codes:         Weston Anna, MPT Pager: [phone]

## 2017-10-07 NOTE — Progress Notes (Signed)
Medications administered by student RN [id number] with supervision of Clinical Instructor Rion Schnitzer MSN, RN-BC or patient's assigned RN.   

## 2017-10-07 NOTE — ED Notes (Signed)
ED TO INPATIENT HANDOFF REPORT  Name/Age/Gender Erica York 77 y.o. female  Code Status Code Status History    Date Active Date Inactive Code Status Order ID Comments User Context   08/01/2017 13:49 08/08/2017 14:17 Full Code [id number]  Erick Colace, NP ED      Home/SNF/Other Home  Chief Complaint Abdominal Pain; Ulcer  Level of Care/Admitting Diagnosis ED Disposition    ED Disposition Condition [place] [facility] Area: [facility] [625638]  Level of Care: Telemetry [5]  Admit to tele based on following criteria: Other see comments  Comments: GI blood loss  Diagnosis: Anemia due to GI blood loss [937342]  Admitting Physician: Bethena Roys [phone]  Attending Physician: Bethena Roys Nessa.Cuff  PT Class (Do Not Modify): Observation [104]  PT Acc Code (Do Not Modify): Observation [10022]       Medical History Past Medical History:  Diagnosis Date  . Anxiety   . COPD (chronic obstructive pulmonary disease) ([place])   . Depression   . Hyperlipidemia     Allergies Allergies  Allergen Reactions  . Zosyn [Piperacillin Sod-Tazobactam So] Swelling and Rash    IV Location/Drains/Wounds Patient Lines/Drains/Airways Status   Active Line/Drains/Airways    Name:   Placement date:   Placement time:   Site:   Days:   Peripheral IV 10/06/17 Right Forearm   10/06/17    2212    Forearm   1   Peripheral IV 10/06/17 Left Forearm   10/06/17    2245    Forearm   1          Labs/Imaging Results for orders placed or performed during the hospital encounter of 10/06/17 (from the past 48 hour(s))  Lipase, blood     Status: None   Collection Time: 10/06/17  6:34 PM  Result Value Ref Range   Lipase 21 11 - 51 U/L    Comment: Performed at [facility], [place] [address], [place], [place] [address]  Comprehensive metabolic panel     Status: Abnormal   Collection Time: 10/06/17  6:34 PM  Result Value Ref Range   Sodium  138 135 - 145 mmol/L   Potassium 3.6 3.5 - 5.1 mmol/L   Chloride 109 101 - 111 mmol/L   CO2 19 (L) 22 - 32 mmol/L   Glucose, Bld 111 (H) 65 - 99 mg/dL   BUN 14 6 - 20 mg/dL   Creatinine, Ser 0.89 0.44 - 1.00 mg/dL   Calcium 9.2 8.9 - 10.3 mg/dL   Total Protein 7.0 6.5 - 8.1 g/dL   Albumin 3.6 3.5 - 5.0 g/dL   AST 16 15 - 41 U/L   ALT 10 (L) 14 - 54 U/L   Alkaline Phosphatase 118 38 - 126 U/L   Total Bilirubin 0.3 0.3 - 1.2 mg/dL   GFR calc non Af Amer >60 >60 mL/min   GFR calc Af Amer >60 >60 mL/min    Comment: (NOTE) The eGFR has been calculated using the CKD EPI equation. This calculation has not been validated in all clinical situations. eGFR's persistently <60 mL/min signify possible Chronic Kidney Disease.    Anion gap 10 5 - 15    Comment: Performed at [facility], [place] [address] [place], [place] [address]  CBC     Status: Abnormal   Collection Time: 10/06/17  6:34 PM  Result Value Ref Range   WBC 9.6 4.0 - 10.5 K/uL   RBC  3.64 (L) 3.87 - 5.11 MIL/uL   Hemoglobin 7.2 (L) 12.0 - 15.0 g/dL   HCT 24.9 (L) 36.0 - 46.0 %   MCV 68.4 (L) 78.0 - 100.0 fL   MCH 19.8 (L) 26.0 - 34.0 pg   MCHC 28.9 (L) 30.0 - 36.0 g/dL   RDW 17.9 (H) 11.5 - 15.5 %   Platelets 346 150 - 400 K/uL    Comment: Performed at [facility], [place] [address], [place], [place] [address]  Prepare RBC     Status: None   Collection Time: 10/06/17 10:00 PM  Result Value Ref Range   Order Confirmation      ORDER PROCESSED BY BLOOD BANK Performed at [facility], [place] [address] [place], [place] [address]   Type and screen [place]     Status: None   Collection Time: 10/06/17 10:07 PM  Result Value Ref Range   ABO/RH(D) O POS    Antibody Screen POS    Sample Expiration      10/09/2017 Performed at [facility], [place] [address] [place], [place] [address]   POC occult blood, ED     Status: None    Collection Time: 10/06/17 10:53 PM  Result Value Ref Range   Fecal Occult Bld NEGATIVE NEGATIVE   Dg Chest 2 View  Result Date: 10/06/2017 CLINICAL DATA:  Dyspnea EXAM: CHEST  2 VIEW COMPARISON:  08/01/2017 FINDINGS: Hyperinflation. Diffuse reticular opacity consistent with chronic interstitial disease. Left lung base more focal opacity. No significant pleural effusion. Stable cardiomediastinal silhouette with aortic atherosclerosis. No pneumothorax. IMPRESSION: 1. Hyperinflation with diffuse reticulation consistent with emphysematous disease and chronic interstitial changes 2. Increased airspace disease at the left lung base, suspicious for a pneumonia. Imaging follow-up to resolution is recommended Electronically Signed   By: Donavan Foil M.D.   On: 10/06/2017 22:49    Pending Labs Unresulted Labs (From admission, onward)   Start     Ordered   10/06/17 2151  Occult blood card to lab, stool  Once,   STAT     10/06/17 2150   10/06/17 1830  Urinalysis, Routine w reflex microscopic  STAT,   STAT     10/06/17 1829      Vitals/Pain Today's Vitals   10/06/17 2200 10/06/17 2236 10/06/17 2250 10/06/17 2355  BP: 135/82 130/85  (!) 135/98  Pulse: (!) 112 94  100  Resp: (!) 22 14  (!) 21  Temp:      TempSrc:      SpO2: 98% 93%  100%  Weight:      Height:      PainSc:   3      Isolation Precautions No active isolations  Medications Medications  0.9 %  sodium chloride infusion (not administered)  pantoprazole (PROTONIX) 80 mg in sodium chloride 0.9 % 100 mL IVPB (0 mg Intravenous Stopped 10/06/17 2241)  morphine 4 MG/ML injection 4 mg (4 mg Intravenous Given 10/06/17 2224)  LORazepam (ATIVAN) injection 0.5 mg (0.5 mg Intravenous Given 10/06/17 2237)    Mobility walks

## 2017-10-07 NOTE — Consult Note (Signed)
[facility] Consult  Referring Provider: Dr.Powell([facility]) Primary Care Physician:  System, Pcp Not In Primary Gastroenterologist: Unassigned  Reason for Consultation:  Symptomatic anemia, intermittent dark stools, duodenal ulcer December 2018  HPI: Erica York is a 77 y.o. female was in her usual state of health until about 2 weeks ago when she developed progressively worsening exertional shortness of breath. For the last 2 weeks she has also had intermittent dark stools suspicious for upper GI bleeding, was admitted in December with upper GI bleeding, underwent EGD on 08/06/2017 which showed multiple duodenal ulcers causing partial obstruction of the C-loop, biopsy showed peptic duodenitis, no malignancy, no intestinal metaplasia and H. pylori was negative.  Patient ran out of pantoprazole, prior to that she was taking it twice a day since discharge in December. Patient has had multiple surgeries(tubal ligation, bladder surgery, hysterectomy, diverticulitis with perforation requiring colostomy, abdominal hernia requiring mesh placement, cholecystectomy ,3 separate explorative laparotomy, repositioning of colostomy on 3 separate occasions, left nephrectomy, last surgery 7 years ago). Patient reports having a colonoscopy 4 years ago. Patient takes WelChol at home as she has diarrhea described as loose, occasionally watery, multiple bowel movement 4-5 times a day along with nocturnal diarrhea 2-3 times per night. She has not noted bright red blood that has noted intermittent dark/black stools. In the last 1 year she has lost several pounds as she reports early satiety,epigastric discomfort mostly postprandial. She denies heartburn, acid reflux, difficulty swallowing or pain on swallowing. Denies use of NSAIDs.   Past Medical History:  Diagnosis Date  . Anxiety   . COPD (chronic obstructive pulmonary disease) ([place])   . Depression   . Hyperlipidemia     Past Surgical  History:  Procedure Laterality Date  . ABDOMINAL HYSTERECTOMY    . APPENDECTOMY    . BLADDER SURGERY     REMOVAL OF TUMORS  . CHOLECYSTECTOMY    . COLOSTOMY/REATTACHMENT    . ESOPHAGOGASTRODUODENOSCOPY N/A 08/06/2017   Procedure: ESOPHAGOGASTRODUODENOSCOPY (EGD);  Surgeon: Clarene Essex, MD;  Location: [facility];  Service: Endoscopy;  Laterality: N/A;  . LEFT NEPHRECTOMY      Prior to Admission medications   Medication Sig Start Date End Date Taking? Authorizing Provider  ALPRAZolam Duanne Moron) 0.5 MG tablet Take 1 tablet (0.5 mg total) by mouth 3 (three) times daily as needed for anxiety. 08/08/17  Yes Caren Griffins, MD  colesevelam (WELCHOL) 625 MG tablet Take 625 mg by mouth daily.   Yes [provider]  pantoprazole (PROTONIX) 40 MG tablet Take 1 tablet (40 mg total) by mouth 2 (two) times daily before a meal. After 1 month take 1 daily in the morning 08/08/17  Yes Gherghe, Vella Redhead, MD  potassium chloride SA (K-DUR,KLOR-CON) 20 MEQ tablet Take 20 mEq by mouth 2 (two) times daily.   Yes [provider]  rOPINIRole (REQUIP) 1 MG tablet Take 1 tablet (1 mg total) by mouth at bedtime as needed (Sleep). 08/08/17  Yes Caren Griffins, MD  sertraline (ZOLOFT) 100 MG tablet Take 1 tablet (100 mg total) by mouth at bedtime. 08/08/17  Yes Caren Griffins, MD    Current Facility-Administered Medications  Medication Dose Route Frequency Provider Last Rate Last Dose  . ipratropium-albuterol (DUONEB) 0.5-2.5 (3) MG/3ML nebulizer solution 3 mL  3 mL Nebulization Q8H PRN Emokpae, Ejiroghene E, MD      . levofloxacin (LEVAQUIN) IVPB 750 mg  750 mg Intravenous Q48H Dorrene German, RPH 100 mL/hr at 10/07/17 0523 750 mg at  10/07/17 0523  . morphine 4 MG/ML injection 2 mg  2 mg Intravenous Q4H PRN Emokpae, Ejiroghene E, MD   2 mg at 10/07/17 0902  . ondansetron (ZOFRAN) injection 4 mg  4 mg Intravenous Q6H PRN Emokpae, Ejiroghene E, MD   4 mg at 10/07/17 0902  . pantoprazole  (PROTONIX) 80 mg in sodium chloride 0.9 % 250 mL (0.32 mg/mL) infusion  8 mg/hr Intravenous Continuous Emokpae, Ejiroghene E, MD 25 mL/hr at 10/07/17 0458 8 mg/hr at 10/07/17 0458    Allergies as of 10/06/2017 - Review Complete 10/06/2017  Allergen Reaction Noted  . Zosyn [piperacillin sod-tazobactam so] Swelling and Rash 08/01/2017    No family history on file.  Social History   Socioeconomic History  . Marital status: Single    Spouse name: Not on file  . Number of children: Not on file  . Years of education: Not on file  . Highest education level: Not on file  Social Needs  . Financial resource strain: Not on file  . Food insecurity - worry: Not on file  . Food insecurity - inability: Not on file  . Transportation needs - medical: Not on file  . Transportation needs - non-medical: Not on file  Occupational History  . Not on file  Tobacco Use  . Smoking status: Current Some Day Smoker    Types: Cigarettes  . Smokeless tobacco: Never Used  Substance and Sexual Activity  . Alcohol use: No    Frequency: Never  . Drug use: No  . Sexual activity: No  Other Topics Concern  . Not on file  Social History Narrative  . Not on file    Review of Systems: Positive for: GI: Described in detail in HPI.    Gen: anorexia, fatigue, weakness, malaise, involuntary weight loss,Denies any fever, chills, rigors, night sweats,  and sleep disorder CV: Denies chest pain, angina, palpitations, syncope, orthopnea, PND, peripheral edema, and claudication. Resp: dyspnea,Denies  cough, sputum, wheezing, coughing up blood. GU : Denies urinary burning, blood in urine, urinary frequency, urinary hesitancy, nocturnal urination, and urinary incontinence. MS: Denies joint pain or swelling.  Denies muscle weakness, cramps, atrophy.  Derm: Denies rash, itching, oral ulcerations, hives, unhealing ulcers.  Psych: Denies depression, anxiety, memory loss, suicidal ideation, hallucinations,  and  confusion. Heme: Denies bruising, bleeding, and enlarged lymph nodes. Neuro:  Denies any headaches, dizziness, paresthesias. Endo:  Denies any problems with DM, thyroid, adrenal function.  Physical Exam: Vital signs in last 24 hours: Temp:  [97.6 F (36.4 C)-98.9 F (37.2 C)] 98.5 F (36.9 C) (02/19 0459) Pulse Rate:  [88-117] 88 (02/19 0925) Resp:  [11-26] 16 (02/19 0925) BP: (106-154)/(64-98) 106/77 (02/19 0925) SpO2:  [92 %-100 %] 94 % (02/19 0925) Weight:  [43.4 kg (95 lb 10.9 oz)-44.7 kg (98 lb 8 oz)] 43.4 kg (95 lb 10.9 oz) (02/19 0054) Last BM Date: 10/06/17  General:   Alert,  thinly built, frail, chronically ill-appearing, pleasant and cooperative in NAD Head:  Normocephalic and atraumatic. Eyes:  Sclera clear, no icterus.   Prominent pallor. Ears:  Normal auditory acuity. Nose:  No deformity, discharge,  or lesions. Mouth:  No deformity or lesions.  Oropharynx pink & moist. Neck:  Supple; no masses or thyromegaly. Lungs:  Clear throughout to auscultation.   No wheezes, crackles, or rhonchi. No acute distress. Heart:  Regular rate and rhythm; no murmurs, clicks, rubs,  or gallops. Extremities:  Without clubbing or edema. Neurologic:  Alert and  oriented x4;  grossly normal neurologically. Skin:  Intact without significant lesions or rashes. Psych:  Alert and cooperative. Normal mood and affect. Abdomen:  Multiple abdominal scars , upper abdominal mesh ,Soft, nontender and nondistended. No hepatosplenomegaly noted. Normal bowel sounds, without guarding, and without rebound.         Lab Results: Recent Labs    10/06/17 1834 10/07/17 0755  WBC 9.6 9.1  HGB 7.2* 7.9*  HCT 24.9* 26.7*  PLT 346 283   BMET Recent Labs    10/06/17 1834  NA 138  K 3.6  CL 109  CO2 19*  GLUCOSE 111*  BUN 14  CREATININE 0.89  CALCIUM 9.2   LFT Recent Labs    10/06/17 1834  PROT 7.0  ALBUMIN 3.6  AST 16  ALT 10*  ALKPHOS 118  BILITOT 0.3   PT/INR No results for  input(s): LABPROT, INR in the last 72 hours.  Studies/Results: Dg Chest 2 View  Result Date: 10/06/2017 CLINICAL DATA:  Dyspnea EXAM: CHEST  2 VIEW COMPARISON:  08/01/2017 FINDINGS: Hyperinflation. Diffuse reticular opacity consistent with chronic interstitial disease. Left lung base more focal opacity. No significant pleural effusion. Stable cardiomediastinal silhouette with aortic atherosclerosis. No pneumothorax. IMPRESSION: 1. Hyperinflation with diffuse reticulation consistent with emphysematous disease and chronic interstitial changes 2. Increased airspace disease at the left lung base, suspicious for a pneumonia. Imaging follow-up to resolution is recommended Electronically Signed   By: Donavan Foil M.D.   On: 10/06/2017 22:49    Impression: 1. Severe symptomatic anemia, hemoglobin 9.1 on discharge on 08/07/2017, 7.2 on admission yesterday, severe microcytosis MCV 68.4. Normal BUN/creatinine ratio are 14/0.89 2. Duodenal ulcers, causing partial obstruction of C-loop noted in December 2018   Plan: 1. Continue proton drip, monitor H&H and transfuse if hemoglobin less than 7. 2. Clear liquid diet, nothing by mouth post midnight for possible EGD in a.m. as patient has had intermittent dark stools, drop in hemoglobin and multiple duodenal ulcers in the past, was on PPI until recently.   LOS: 0 days   Ronnette Juniper, M.D. 10/07/2017, 9:45 AM  Pager [phone] If no answer or after 5 PM call [phone]

## 2017-10-07 NOTE — Progress Notes (Signed)
Pharmacy Antibiotic Note  Erica York is a 77 y.o. female admitted on 10/06/2017 with pneumonia.  Pharmacy has been consulted for levaquin dosing.  Plan: Levaquin 750 mg IV q48h F/u scr/cultures  Height: 5\' 1"  (154.9 cm) Weight: 95 lb 10.9 oz (43.4 kg) IBW/kg (Calculated) : 47.8  Temp (24hrs), Avg:98.5 F (36.9 C), Min:97.6 F (36.4 C), Max:98.9 F (37.2 C)  Recent Labs  Lab 10/06/17 1834  WBC 9.6  CREATININE 0.89    Estimated Creatinine Clearance: 37.4 mL/min (by C-G formula based on SCr of 0.89 mg/dL).    Allergies  Allergen Reactions  . Zosyn [Piperacillin Sod-Tazobactam So] Swelling and Rash    Antimicrobials this admission: 2/19 levaquin >>    >>   Dose adjustments this admission:   Microbiology results:  BCx:   UCx:    Sputum:    MRSA PCR:   Thank you for allowing pharmacy to be a part of this patient's care.  Dorrene German 10/07/2017 4:18 AM

## 2017-10-07 NOTE — Progress Notes (Signed)
Initial Nutrition Assessment  DOCUMENTATION CODES:   Underweight, Non-severe (moderate) malnutrition in context of chronic illness  INTERVENTION:    Monitor for diet advancement/toleration  Magic cup TID with meals, each supplement provides 290 kcal and 9 grams of protein once advanced to full liquid.   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  GOAL:   Patient will meet greater than or equal to 90% of their needs  MONITOR:   PO intake, Supplement acceptance, Diet advancement, Weight trends, Labs, I & O's  REASON FOR ASSESSMENT:   Malnutrition Screening Tool    ASSESSMENT:   Pt with PMH significant for COPD, duodenal ulcers, depression, who presented to the ED with complaints of abdominal pain, generalized weakness, and intermittent dark stools. Pt recently had a hospital admission 08/01/17- 08/08/17, managed for upper GI bleed, requiring ICU admission with Hgb 6 with hypotension. Admitted for anemia due to GI blood loss and duodenal ulcer. Plan for EGD 2/19.    Spoke with pt at bedside. Reports having decreased PO intake since being discharge from hospital 08/08/17. States she typically eats two meals per day that consist of noodles, rice, and pork chops. Abdominal pain worsened over the last two weeks which she states is from her ulcer. Pt had clear liquid diet tray at bedside. When asked about toleration of soup and jello, pt states she has to take it slow because she does want the pain to come back. Pt refuses any clear supplementation. Will to try Magic cups once diet is advanced to full liquids.   Pt endorses a UBW of 128 lb, and states she has lost 35 lb within a years time frame. Records indicate pt weighed 97 lb 08/08/17 and 95 lb this admission. Wt loss not significant for  time frame. Nutrition-Focused physical exam completed.   Medications reviewed and include: IV abx Labs reviewed.  NUTRITION - FOCUSED PHYSICAL EXAM:    Most Recent Value  Orbital Region  Moderate depletion  Upper Arm Region  Severe depletion  Thoracic and Lumbar Region  Unable to assess  Buccal Region  Moderate depletion  Temple Region  Moderate depletion  Clavicle Bone Region  Severe depletion  Clavicle and Acromion Bone Region  Severe depletion  Scapular Bone Region  Unable to assess  Dorsal Hand  Mild depletion  Patellar Region  Severe depletion  Anterior Thigh Region  Severe depletion  Posterior Calf Region  Severe depletion  Edema (RD Assessment)  None  Hair  Reviewed  Eyes  Reviewed  Mouth  Reviewed  Skin  Reviewed  Nails  Reviewed     Diet Order:  Diet clear liquid Room service appropriate? Yes; Fluid consistency: Thin Diet NPO time specified  EDUCATION NEEDS:   Education needs have been addressed  Skin:  Skin Assessment: Reviewed RN Assessment  Last BM:  PTA  Height:   Ht Readings from Last 1 Encounters:  10/07/17 5\' 1"  (1.549 m)    Weight:   Wt Readings from Last 1 Encounters:  10/07/17 95 lb 10.9 oz (43.4 kg)    Ideal Body Weight:  47.7 kg  BMI:  Body mass index is 18.08 kg/m.  Estimated Nutritional Needs:   Kcal:  1350-1550 kcal/day  Protein:  65-75 g/day  Fluid:  >1.3 L/day    Mariana Single RD, LDN Clinical Nutrition Pager # -  [phone]  

## 2017-10-07 NOTE — H&P (View-Only) (Signed)
[facility] Consult  Referring Provider: Dr.Powell([facility]) Primary Care Physician:  System, Pcp Not In Primary Gastroenterologist: Unassigned  Reason for Consultation:  Symptomatic anemia, intermittent dark stools, duodenal ulcer December 2018  HPI: Erica York is a 77 y.o. female was in her usual state of health until about 2 weeks ago when she developed progressively worsening exertional shortness of breath. For the last 2 weeks she has also had intermittent dark stools suspicious for upper GI bleeding, was admitted in December with upper GI bleeding, underwent EGD on 08/06/2017 which showed multiple duodenal ulcers causing partial obstruction of the C-loop, biopsy showed peptic duodenitis, no malignancy, no intestinal metaplasia and H. pylori was negative.  Patient ran out of pantoprazole, prior to that she was taking it twice a day since discharge in December. Patient has had multiple surgeries(tubal ligation, bladder surgery, hysterectomy, diverticulitis with perforation requiring colostomy, abdominal hernia requiring mesh placement, cholecystectomy ,3 separate explorative laparotomy, repositioning of colostomy on 3 separate occasions, left nephrectomy, last surgery 7 years ago). Patient reports having a colonoscopy 4 years ago. Patient takes WelChol at home as she has diarrhea described as loose, occasionally watery, multiple bowel movement 4-5 times a day along with nocturnal diarrhea 2-3 times per night. She has not noted bright red blood that has noted intermittent dark/black stools. In the last 1 year she has lost several pounds as she reports early satiety,epigastric discomfort mostly postprandial. She denies heartburn, acid reflux, difficulty swallowing or pain on swallowing. Denies use of NSAIDs.   Past Medical History:  Diagnosis Date  . Anxiety   . COPD (chronic obstructive pulmonary disease) ([place])   . Depression   . Hyperlipidemia     Past Surgical  History:  Procedure Laterality Date  . ABDOMINAL HYSTERECTOMY    . APPENDECTOMY    . BLADDER SURGERY     REMOVAL OF TUMORS  . CHOLECYSTECTOMY    . COLOSTOMY/REATTACHMENT    . ESOPHAGOGASTRODUODENOSCOPY N/A 08/06/2017   Procedure: ESOPHAGOGASTRODUODENOSCOPY (EGD);  Surgeon: Clarene Essex, MD;  Location: [facility];  Service: Endoscopy;  Laterality: N/A;  . LEFT NEPHRECTOMY      Prior to Admission medications   Medication Sig Start Date End Date Taking? Authorizing Provider  ALPRAZolam Duanne Moron) 0.5 MG tablet Take 1 tablet (0.5 mg total) by mouth 3 (three) times daily as needed for anxiety. 08/08/17  Yes Caren Griffins, MD  colesevelam (WELCHOL) 625 MG tablet Take 625 mg by mouth daily.   Yes [provider]  pantoprazole (PROTONIX) 40 MG tablet Take 1 tablet (40 mg total) by mouth 2 (two) times daily before a meal. After 1 month take 1 daily in the morning 08/08/17  Yes Gherghe, Vella Redhead, MD  potassium chloride SA (K-DUR,KLOR-CON) 20 MEQ tablet Take 20 mEq by mouth 2 (two) times daily.   Yes [provider]  rOPINIRole (REQUIP) 1 MG tablet Take 1 tablet (1 mg total) by mouth at bedtime as needed (Sleep). 08/08/17  Yes Caren Griffins, MD  sertraline (ZOLOFT) 100 MG tablet Take 1 tablet (100 mg total) by mouth at bedtime. 08/08/17  Yes Caren Griffins, MD    Current Facility-Administered Medications  Medication Dose Route Frequency Provider Last Rate Last Dose  . ipratropium-albuterol (DUONEB) 0.5-2.5 (3) MG/3ML nebulizer solution 3 mL  3 mL Nebulization Q8H PRN Emokpae, Ejiroghene E, MD      . levofloxacin (LEVAQUIN) IVPB 750 mg  750 mg Intravenous Q48H Dorrene German, RPH 100 mL/hr at 10/07/17 0523 750 mg at  10/07/17 0523  . morphine 4 MG/ML injection 2 mg  2 mg Intravenous Q4H PRN Emokpae, Ejiroghene E, MD   2 mg at 10/07/17 0902  . ondansetron (ZOFRAN) injection 4 mg  4 mg Intravenous Q6H PRN Emokpae, Ejiroghene E, MD   4 mg at 10/07/17 0902  . pantoprazole  (PROTONIX) 80 mg in sodium chloride 0.9 % 250 mL (0.32 mg/mL) infusion  8 mg/hr Intravenous Continuous Emokpae, Ejiroghene E, MD 25 mL/hr at 10/07/17 0458 8 mg/hr at 10/07/17 0458    Allergies as of 10/06/2017 - Review Complete 10/06/2017  Allergen Reaction Noted  . Zosyn [piperacillin sod-tazobactam so] Swelling and Rash 08/01/2017    No family history on file.  Social History   Socioeconomic History  . Marital status: Single    Spouse name: Not on file  . Number of children: Not on file  . Years of education: Not on file  . Highest education level: Not on file  Social Needs  . Financial resource strain: Not on file  . Food insecurity - worry: Not on file  . Food insecurity - inability: Not on file  . Transportation needs - medical: Not on file  . Transportation needs - non-medical: Not on file  Occupational History  . Not on file  Tobacco Use  . Smoking status: Current Some Day Smoker    Types: Cigarettes  . Smokeless tobacco: Never Used  Substance and Sexual Activity  . Alcohol use: No    Frequency: Never  . Drug use: No  . Sexual activity: No  Other Topics Concern  . Not on file  Social History Narrative  . Not on file    Review of Systems: Positive for: GI: Described in detail in HPI.    Gen: anorexia, fatigue, weakness, malaise, involuntary weight loss,Denies any fever, chills, rigors, night sweats,  and sleep disorder CV: Denies chest pain, angina, palpitations, syncope, orthopnea, PND, peripheral edema, and claudication. Resp: dyspnea,Denies  cough, sputum, wheezing, coughing up blood. GU : Denies urinary burning, blood in urine, urinary frequency, urinary hesitancy, nocturnal urination, and urinary incontinence. MS: Denies joint pain or swelling.  Denies muscle weakness, cramps, atrophy.  Derm: Denies rash, itching, oral ulcerations, hives, unhealing ulcers.  Psych: Denies depression, anxiety, memory loss, suicidal ideation, hallucinations,  and  confusion. Heme: Denies bruising, bleeding, and enlarged lymph nodes. Neuro:  Denies any headaches, dizziness, paresthesias. Endo:  Denies any problems with DM, thyroid, adrenal function.  Physical Exam: Vital signs in last 24 hours: Temp:  [97.6 F (36.4 C)-98.9 F (37.2 C)] 98.5 F (36.9 C) (02/19 0459) Pulse Rate:  [88-117] 88 (02/19 0925) Resp:  [11-26] 16 (02/19 0925) BP: (106-154)/(64-98) 106/77 (02/19 0925) SpO2:  [92 %-100 %] 94 % (02/19 0925) Weight:  [43.4 kg (95 lb 10.9 oz)-44.7 kg (98 lb 8 oz)] 43.4 kg (95 lb 10.9 oz) (02/19 0054) Last BM Date: 10/06/17  General:   Alert,  thinly built, frail, chronically ill-appearing, pleasant and cooperative in NAD Head:  Normocephalic and atraumatic. Eyes:  Sclera clear, no icterus.   Prominent pallor. Ears:  Normal auditory acuity. Nose:  No deformity, discharge,  or lesions. Mouth:  No deformity or lesions.  Oropharynx pink & moist. Neck:  Supple; no masses or thyromegaly. Lungs:  Clear throughout to auscultation.   No wheezes, crackles, or rhonchi. No acute distress. Heart:  Regular rate and rhythm; no murmurs, clicks, rubs,  or gallops. Extremities:  Without clubbing or edema. Neurologic:  Alert and  oriented x4;  grossly normal neurologically. Skin:  Intact without significant lesions or rashes. Psych:  Alert and cooperative. Normal mood and affect. Abdomen:  Multiple abdominal scars , upper abdominal mesh ,Soft, nontender and nondistended. No hepatosplenomegaly noted. Normal bowel sounds, without guarding, and without rebound.         Lab Results: Recent Labs    10/06/17 1834 10/07/17 0755  WBC 9.6 9.1  HGB 7.2* 7.9*  HCT 24.9* 26.7*  PLT 346 283   BMET Recent Labs    10/06/17 1834  NA 138  K 3.6  CL 109  CO2 19*  GLUCOSE 111*  BUN 14  CREATININE 0.89  CALCIUM 9.2   LFT Recent Labs    10/06/17 1834  PROT 7.0  ALBUMIN 3.6  AST 16  ALT 10*  ALKPHOS 118  BILITOT 0.3   PT/INR No results for  input(s): LABPROT, INR in the last 72 hours.  Studies/Results: Dg Chest 2 View  Result Date: 10/06/2017 CLINICAL DATA:  Dyspnea EXAM: CHEST  2 VIEW COMPARISON:  08/01/2017 FINDINGS: Hyperinflation. Diffuse reticular opacity consistent with chronic interstitial disease. Left lung base more focal opacity. No significant pleural effusion. Stable cardiomediastinal silhouette with aortic atherosclerosis. No pneumothorax. IMPRESSION: 1. Hyperinflation with diffuse reticulation consistent with emphysematous disease and chronic interstitial changes 2. Increased airspace disease at the left lung base, suspicious for a pneumonia. Imaging follow-up to resolution is recommended Electronically Signed   By: Donavan Foil M.D.   On: 10/06/2017 22:49    Impression: 1. Severe symptomatic anemia, hemoglobin 9.1 on discharge on 08/07/2017, 7.2 on admission yesterday, severe microcytosis MCV 68.4. Normal BUN/creatinine ratio are 14/0.89 2. Duodenal ulcers, causing partial obstruction of C-loop noted in December 2018   Plan: 1. Continue proton drip, monitor H&H and transfuse if hemoglobin less than 7. 2. Clear liquid diet, nothing by mouth post midnight for possible EGD in a.m. as patient has had intermittent dark stools, drop in hemoglobin and multiple duodenal ulcers in the past, was on PPI until recently.   LOS: 0 days   Ronnette Juniper, M.D. 10/07/2017, 9:45 AM  Pager [phone] If no answer or after 5 PM call [phone]

## 2017-10-07 NOTE — Progress Notes (Signed)
PROGRESS NOTE    Erica York  BZJ:[id number] DOB: [birth year] DOA: 10/06/2017 PCP: System, Pcp Not In   Brief Narrative: Per HPI Erica York is a 77 y.o. female with medical history significant for COPD, duodenal ulcers, depression, who presented to the ED with complaints of abdominal pain, generalized weakness and difficulty breathing ~4 days.  Patient reports also a productive cough- ~4 days.  No fevers.  Patient also reports intermittent dark stools annd 4 episodes of loose stools yesterday significantly changed from baseline , she has chronic diarrhea.  She reports 2 episodes of bloody vomitus yesterday.  Patient recently had a prolonged hospital admission 08/01/17- 08/08/17, managed for upper GI bleed, requiring ICU admission with Hgb 6 with hypotension.  EGD showed few nonbleeding cratered duodenal ulcers with no stigmata of bleeding in the duodenum, large ulcerated mass with no bleeding in first part of duodenum, biopsy showed peptic duodenitis with ulceration and erosion, also with chronic gastritis, no malignancy.  She has had a history of significant NSAID use.  She was discharged home to take Protonix twice daily, she was given a month's prescription, Patient today denies NSAID use, but reports she ran out of her Protonix after a month.   Assessment & Plan:   Principal Problem:   Anemia due to GI blood loss Active Problems:   Symptomatic anemia   Duodenal ulcer  Anemia due to GI blood loss- likely from prior NSAID use. Hgb 7.2. 9.1 on discharge- 08/06/17. EGD- by Dr. 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  Path with peptic duodenitis with erosion, ulceration, inflammation and mild chornic gastritis.   No metaplasia or H. pylori identified. Pt was to follow up with [facility] 2-4 weeks for follow up EGD and colonoscopy.  Noncompliance with Protonix due to financial problems. - Appreciate GI recs - IV Protonix  bolus 80 mg already given in ED, will start Protonix drip - Follow H/H - Social work consult for meds placed in ED - Zofran prn - NPO  Duodenal ulcer- see above, also with ?  Partial duodenal obstruction. She reports vomiting and chronic loose stools.  Appreciate GI recs  Pneumonia- productive cough, SOB. WBC- 9.6. No fevers.  Hospital admission 12/14. Chest Xray-increased airspace disease left lung base suspicious for pneumonia. - levaquin - blood cultures X 2 - urine strep and sputum cx  COPD- appears stable.  - Duonebs PRN  DVT prophylaxis: SCD Code Status: full code Family Communication: none at bedside Disposition Plan: pending further w/u  Consultants:   GI  Procedures:   none  Antimicrobials:  Anti-infectives (From admission, onward)   Start     Dose/Rate Route Frequency Ordered Stop   10/07/17 0430  levofloxacin (LEVAQUIN) IVPB 750 mg     750 mg 100 mL/hr over 90 Minutes Intravenous Every 48 hours 10/07/17 0417        Subjective: Pain in ulcer. She was more concerned with breathing on presentation.  Albuterol wasn't helping.  Things are better now. Denies hematemesis.  Black stool last week.   Objective: Vitals:   10/07/17 0135 10/07/17 0153 10/07/17 0459 10/07/17 0925  BP: 124/64 125/68 136/77 106/77  Pulse: 93 (!) 102 88 88  Resp: 20 18 18 16   Temp: 98.6 F (37 C) 98.9 F (37.2 C) 98.5 F (36.9 C) 98.4 F (36.9 C)  TempSrc: Oral Oral Oral Oral  SpO2: 93% 95% 95% 94%  Weight:      Height:  Intake/Output Summary (Last 24 hours) at 10/07/2017 1002 Last data filed at 10/07/2017 0725 Gross per 24 hour  Intake 652 ml  Output 300 ml  Net 352 ml   Filed Weights   10/06/17 1832 10/07/17 0054  Weight: 44.7 kg (98 lb 8 oz) 43.4 kg (95 lb 10.9 oz)    Examination:  General exam: Appears calm and comfortable  Respiratory system: Clear to auscultation. Respiratory effort normal. Cardiovascular system: S1 & S2 heard, RRR. No JVD, murmurs,  rubs, gallops or clicks. No pedal edema. Gastrointestinal system: Abdomen is tender to palpation.  Most in epigastric region.    No organomegaly or masses felt. Ventral hernia, ttp.  Central nervous system: Alert and oriented. No focal neurological deficits. Extremities: Symmetric 5 x 5 power. Skin: No rashes, lesions or ulcers Psychiatry: Judgement and insight appear normal. Mood & affect appropriate.     Data Reviewed: I have personally reviewed following labs and imaging studies  CBC: Recent Labs  Lab 10/06/17 1834 10/07/17 0755  WBC 9.6 9.1  HGB 7.2* 7.9*  HCT 24.9* 26.7*  MCV 68.4* 72.0*  PLT 346 025   Basic Metabolic Panel: Recent Labs  Lab 10/06/17 1834  NA 138  K 3.6  CL 109  CO2 19*  GLUCOSE 111*  BUN 14  CREATININE 0.89  CALCIUM 9.2   GFR: Estimated Creatinine Clearance: 37.4 mL/min (by C-G formula based on SCr of 0.89 mg/dL). Liver Function Tests: Recent Labs  Lab 10/06/17 1834  AST 16  ALT 10*  ALKPHOS 118  BILITOT 0.3  PROT 7.0  ALBUMIN 3.6   Recent Labs  Lab 10/06/17 1834  LIPASE 21   No results for input(s): AMMONIA in the last 168 hours. Coagulation Profile: No results for input(s): INR, PROTIME in the last 168 hours. Cardiac Enzymes: No results for input(s): CKTOTAL, CKMB, CKMBINDEX, TROPONINI in the last 168 hours. BNP (last 3 results) No results for input(s): PROBNP in the last 8760 hours. HbA1C: No results for input(s): HGBA1C in the last 72 hours. CBG: No results for input(s): GLUCAP in the last 168 hours. Lipid Profile: No results for input(s): CHOL, HDL, LDLCALC, TRIG, CHOLHDL, LDLDIRECT in the last 72 hours. Thyroid Function Tests: No results for input(s): TSH, T4TOTAL, FREET4, T3FREE, THYROIDAB in the last 72 hours. Anemia Panel: No results for input(s): VITAMINB12, FOLATE, FERRITIN, TIBC, IRON, RETICCTPCT in the last 72 hours. Sepsis Labs: No results for input(s): PROCALCITON, LATICACIDVEN in the last 168 hours.  No  results found for this or any previous visit (from the past 240 hour(s)).       Radiology Studies: Dg Chest 2 View  Result Date: 10/06/2017 CLINICAL DATA:  Dyspnea EXAM: CHEST  2 VIEW COMPARISON:  08/01/2017 FINDINGS: Hyperinflation. Diffuse reticular opacity consistent with chronic interstitial disease. Left lung base more focal opacity. No significant pleural effusion. Stable cardiomediastinal silhouette with aortic atherosclerosis. No pneumothorax. IMPRESSION: 1. Hyperinflation with diffuse reticulation consistent with emphysematous disease and chronic interstitial changes 2. Increased airspace disease at the left lung base, suspicious for a pneumonia. Imaging follow-up to resolution is recommended Electronically Signed   By: Donavan Foil M.D.   On: 10/06/2017 22:49        Scheduled Meds: Continuous Infusions: . levofloxacin (LEVAQUIN) IV 750 mg (10/07/17 0523)  . pantoprozole (PROTONIX) infusion 8 mg/hr (10/07/17 0458)     LOS: 0 days    Time spent: over 30 min    Fayrene Helper, MD [facility] Pager [phone]  If 7PM-7AM, please contact  night-coverage [email or web address] Password [email or web address] 10/07/2017, 10:02 AM

## 2017-10-08 ENCOUNTER — Encounter (HOSPITAL_COMMUNITY)
Admission: EM | Disposition: A | Discharge status: Home / Self Care | Payer: Self-pay | Source: Home / Self Care | Attending: Internal Medicine

## 2017-10-08 ENCOUNTER — Inpatient Hospital Stay (HOSPITAL_COMMUNITY): Payer: Medicare Other | Admitting: Anesthesiology

## 2017-10-08 ENCOUNTER — Encounter (HOSPITAL_COMMUNITY): Payer: Self-pay | Admitting: Certified Registered Nurse Anesthetist

## 2017-10-08 DIAGNOSIS — K279 Peptic ulcer, site unspecified, unspecified as acute or chronic, without hemorrhage or perforation: Secondary | ICD-10-CM

## 2017-10-08 DIAGNOSIS — D500 Iron deficiency anemia secondary to blood loss (chronic): Secondary | ICD-10-CM

## 2017-10-08 HISTORY — PX: ESOPHAGOGASTRODUODENOSCOPY (EGD) WITH PROPOFOL: SHX5813

## 2017-10-08 LAB — COMPREHENSIVE METABOLIC PANEL
ALBUMIN: 3.1 g/dL — AB (ref 3.5–5.0)
ALK PHOS: 105 U/L (ref 38–126)
ALT: 7 U/L — AB (ref 14–54)
ANION GAP: 7 (ref 5–15)
AST: 13 U/L — ABNORMAL LOW (ref 15–41)
BUN: 9 mg/dL (ref 6–20)
CHLORIDE: 111 mmol/L (ref 101–111)
CO2: 21 mmol/L — AB (ref 22–32)
CREATININE: 0.93 mg/dL (ref 0.44–1.00)
Calcium: 8.8 mg/dL — ABNORMAL LOW (ref 8.9–10.3)
GFR calc Af Amer: >60 mL/min (ref >60)
GFR calc non Af Amer: 59 mL/min — ABNORMAL LOW (ref >60)
GLUCOSE: 88 mg/dL (ref 65–99)
Potassium: 3.8 mmol/L (ref 3.5–5.1)
SODIUM: 139 mmol/L (ref 135–145)
Total Bilirubin: 0.4 mg/dL (ref 0.3–1.2)
Total Protein: 6.3 g/dL — ABNORMAL LOW (ref 6.5–8.1)

## 2017-10-08 LAB — TYPE AND SCREEN
ABO/RH(D): O POS
Antibody Screen: POSITIVE
Donor AG Type: NEGATIVE
PT AG Type: NEGATIVE
Unit division: 0

## 2017-10-08 LAB — BPAM RBC
Blood Product Expiration Date: 201903122359
ISSUE DATE / TIME: 201902190122
Unit Type and Rh: 5100

## 2017-10-08 LAB — EXPECTORATED SPUTUM ASSESSMENT W GRAM STAIN, RFLX TO RESP C

## 2017-10-08 LAB — CBC
HCT: 29 % — ABNORMAL LOW (ref 36.0–46.0)
HEMOGLOBIN: 8.4 g/dL — AB (ref 12.0–15.0)
MCH: 21.1 pg — ABNORMAL LOW (ref 26.0–34.0)
MCHC: 29 g/dL — ABNORMAL LOW (ref 30.0–36.0)
MCV: 72.7 fL — ABNORMAL LOW (ref 78.0–100.0)
PLATELETS: 264 10*3/uL (ref 150–400)
RBC: 3.99 MIL/uL (ref 3.87–5.11)
RDW: 19.4 % — ABNORMAL HIGH (ref 11.5–15.5)
WBC: 8.2 10*3/uL (ref 4.0–10.5)

## 2017-10-08 LAB — STREP PNEUMONIAE URINARY ANTIGEN: Strep Pneumo Urinary Antigen: NEGATIVE

## 2017-10-08 LAB — EXPECTORATED SPUTUM ASSESSMENT W REFEX TO RESP CULTURE

## 2017-10-08 LAB — MAGNESIUM: Magnesium: 1.8 mg/dL (ref 1.7–2.4)

## 2017-10-08 SURGERY — ESOPHAGOGASTRODUODENOSCOPY (EGD) WITH PROPOFOL
Anesthesia: Monitor Anesthesia Care | Laterality: Left

## 2017-10-08 MED ORDER — PROPOFOL 500 MG/50ML IV EMUL
INTRAVENOUS | Status: DC | PRN
  Administered 2017-10-08: 75 ug/kg/min via INTRAVENOUS

## 2017-10-08 MED ORDER — LACTATED RINGERS IV SOLN
INTRAVENOUS | Status: DC | PRN
  Administered 2017-10-08: 10:00:00 via INTRAVENOUS

## 2017-10-08 MED ORDER — GUAIFENESIN ER 600 MG PO TB12
600.0000 mg | ORAL_TABLET | Freq: Two times a day (BID) | ORAL | Status: DC
  Administered 2017-10-08 – 2017-10-12 (×8): 600 mg via ORAL
  Filled 2017-10-08 (×8): qty 1

## 2017-10-08 MED ORDER — LIDOCAINE 2% (20 MG/ML) 5 ML SYRINGE
INTRAMUSCULAR | Status: DC | PRN
  Administered 2017-10-08: 60 mg via INTRAVENOUS

## 2017-10-08 MED ORDER — PROPOFOL 10 MG/ML IV BOLUS
INTRAVENOUS | Status: AC
  Filled 2017-10-08: qty 20

## 2017-10-08 MED ORDER — PROPOFOL 10 MG/ML IV BOLUS
INTRAVENOUS | Status: DC | PRN
  Administered 2017-10-08: 15 mg via INTRAVENOUS

## 2017-10-08 SURGICAL SUPPLY — 14 items

## 2017-10-08 NOTE — Anesthesia Preprocedure Evaluation (Signed)
Anesthesia Evaluation  Patient identified by MRN, date of birth, ID band Patient awake    Reviewed: Allergy & Precautions, NPO status   Airway Mallampati: I  TM Distance: >3 FB Neck ROM: Full    Dental  (+) Edentulous Lower, Edentulous Upper   Pulmonary COPD, Current Smoker,    Pulmonary exam normal        Cardiovascular Exercise Tolerance: Poor  Rhythm:Regular Rate:Normal     Neuro/Psych    GI/Hepatic PUD,   Endo/Other    Renal/GU      Musculoskeletal   Abdominal   Peds  Hematology  (+) anemia ,   Anesthesia Other Findings   Reproductive/Obstetrics                             Anesthesia Physical Anesthesia Plan  ASA: III  Anesthesia Plan: MAC   Post-op Pain Management:    Induction: Intravenous  PONV Risk Score and Plan:   Airway Management Planned: Mask, Natural Airway and Nasal Cannula  Additional Equipment:   Intra-op Plan:   Post-operative Plan:   Informed Consent: I have reviewed the patients History and Physical, chart, labs and discussed the procedure including the risks, benefits and alternatives for the proposed anesthesia with the patient or authorized representative who has indicated his/her understanding and acceptance.     Plan Discussed with: CRNA and Anesthesiologist  Anesthesia Plan Comments:         Anesthesia Quick Evaluation

## 2017-10-08 NOTE — Anesthesia Procedure Notes (Signed)
Date/Time: 10/08/2017 10:34 AM Performed by: Claudia Desanctis, CRNA Oxygen Delivery Method: Nasal cannula

## 2017-10-08 NOTE — Anesthesia Postprocedure Evaluation (Signed)
Anesthesia Post Note  Patient: Erica York  Procedure(s) Performed: ESOPHAGOGASTRODUODENOSCOPY (EGD) WITH PROPOFOL (Left )     Patient location during evaluation: Endoscopy Anesthesia Type: MAC Level of consciousness: awake and alert Pain management: pain level controlled Vital Signs Assessment: post-procedure vital signs reviewed and stable Respiratory status: spontaneous breathing, nonlabored ventilation, respiratory function stable and patient connected to nasal cannula oxygen Cardiovascular status: stable and blood pressure returned to baseline Postop Assessment: no apparent nausea or vomiting Anesthetic complications: no    Last Vitals:  Vitals:   10/08/17 1100 10/08/17 1251  BP: 119/63 128/69  Pulse: 85 91  Resp: 16 16  Temp:  36.8 C  SpO2: 92% 95%    Last Pain:  Vitals:   10/08/17 1333  TempSrc:   PainSc: Cameron A Avalynn Bowe

## 2017-10-08 NOTE — Interval H&P Note (Signed)
History and Physical Interval Note:  10/08/2017 10:27 AM  [name]  has presented today for surgery, with the diagnosis of Anemia, duodenal ulcer  The various methods of treatment have been discussed with the patient and family. After consideration of risks, benefits and other options for treatment, the patient has consented to  Procedure(s): ESOPHAGOGASTRODUODENOSCOPY (EGD) WITH PROPOFOL (Left) as a surgical intervention .  [name] history has been reviewed, patient examined, no change in status, stable for surgery.  I have reviewed the patient's chart and labs.  Questions were answered to the patient's satisfaction.     Erica York

## 2017-10-08 NOTE — Transfer of Care (Signed)
Immediate Anesthesia Transfer of Care Note  Patient: Erica York  Procedure(s) Performed: ESOPHAGOGASTRODUODENOSCOPY (EGD) WITH PROPOFOL (Left )  Patient Location: PACU and Endoscopy Unit  Anesthesia Type:MAC  Level of Consciousness: awake, alert , oriented and patient cooperative  Airway & Oxygen Therapy: Patient Spontanous Breathing and Patient connected to nasal cannula oxygen  Post-op Assessment: Report given to RN and Post -op Vital signs reviewed and stable  Post vital signs: Reviewed and stable  Last Vitals:  Vitals:   10/08/17 0446 10/08/17 0957  BP: 120/71 133/81  Pulse: 93 93  Resp: 20 20  Temp: 37.1 C 36.9 C  SpO2: 91% 93%    Last Pain:  Vitals:   10/08/17 0957  TempSrc: Oral  PainSc: 8       Patients Stated Pain Goal: 3 (40/97/35 3299)  Complications: No apparent anesthesia complications

## 2017-10-08 NOTE — Progress Notes (Signed)
[facility] PROGRESS NOTE  Erica York UMP:[id number] DOB: 1941-05-19 DOA: 10/06/2017  PCP: System, Pcp Not In  Brief History/Interval Summary: Erica York a 77 y.o.femalewith medical history significantfor COPD,duodenal ulcers,depression,who presented to the ED with complaints of abdominal pain,generalized weakness and difficulty breathingfor 4 days.Patient also reported intermittent dark stools annd4 episodes of loose stools.She reports 2episodes of bloody vomitus. Patientrecentlyhad a prolonged hospital admission 08/01/17- 08/08/17,managed for upper GI bleed,requiringICUadmissionwith Hgb 6 with hypotension.EGD showed few nonbleeding cratered duodenal ulcers with no stigmata of bleeding in the duodenum,large ulcerated mass with no bleeding in first part of duodenum,biopsy showed peptic duodenitis with ulceration and erosion,also with chronic gastritis, nomalignancy.  Reason for Visit: Peptic ulcer disease  Consultants: Gastroenterology  Procedures:   EGD Impression:              - Normal esophagus. - Small hiatal hernia. - Non-bleeding gastric ulcers with no stigmata of bleeding. - One non-bleeding duodenal ulcer with pigmented material.  Antibiotics: Levaquin  Subjective/Interval History: Patient seen after her upper endoscopy.  She continues to have a cough without much expectoration.  Continues to have upper abdominal discomfort.  No nausea or vomiting.  ROS: Denies any headaches.  Objective:  Vital Signs  Vitals:   10/08/17 1051 10/08/17 1100 10/08/17 1251 10/08/17 1512  BP: (!) 105/53 119/63 128/69   Pulse: 87 85 91   Resp: 17 16 16    Temp: 98.2 F (36.8 C)  98.2 F (36.8 C)   TempSrc: Oral  Oral   SpO2: 96% 92% 95% 93%  Weight:      Height:        Intake/Output Summary (Last 24 hours) at 10/08/2017 1549 Last data filed at 10/08/2017 0600 Gross per 24 hour  Intake 932.83 ml  Output -  Net 932.83 ml   Filed  Weights   10/06/17 1832 10/07/17 0054 10/08/17 0957  Weight: 44.7 kg (98 lb 8 oz) 43.4 kg (95 lb 10.9 oz) 43.1 kg (95 lb)    General appearance: alert, cooperative, appears stated age and no distress Head: Normocephalic, without obvious abnormality, atraumatic Resp: Crackles bilateral bases left more than right.  No wheezing or rhonchi. Cardio: regular rate and rhythm, S1, S2 normal, no murmur, click, rub or gallop GI: Mildly tender in the epigastric area without any rebound rigidity or guarding.  No masses organomegaly.   Extremities: extremities normal, atraumatic, no cyanosis or edema Neurologic: No focal deficits  Lab Results:  Data Reviewed: I have personally reviewed following labs and imaging studies  CBC: Recent Labs  Lab 10/06/17 1834 10/07/17 0755 10/08/17 0435  WBC 9.6 9.1 8.2  HGB 7.2* 7.9* 8.4*  HCT 24.9* 26.7* 29.0*  MCV 68.4* 72.0* 72.7*  PLT 346 283 400    Basic Metabolic Panel: Recent Labs  Lab 10/06/17 1834 10/08/17 0435  NA 138 139  K 3.6 3.8  CL 109 111  CO2 19* 21*  GLUCOSE 111* 88  BUN 14 9  CREATININE 0.89 0.93  CALCIUM 9.2 8.8*  MG  --  1.8    GFR: Estimated Creatinine Clearance: 35.6 mL/min (by C-G formula based on SCr of 0.93 mg/dL).  Liver Function Tests: Recent Labs  Lab 10/06/17 1834 10/08/17 0435  AST 16 13*  ALT 10* 7*  ALKPHOS 118 105  BILITOT 0.3 0.4  PROT 7.0 6.3*  ALBUMIN 3.6 3.1*    Recent Labs  Lab 10/06/17 1834  LIPASE 21     Recent Results (from the past 240 hour(s))  Culture,  blood (routine x 2)     Status: None (Preliminary result)   Collection Time: 10/07/17  7:54 AM  Result Value Ref Range Status   Specimen Description   Final    BLOOD RIGHT ANTECUBITAL Performed at [facility] [address] [place], [place] [address]    Special Requests   Final    BOTTLES DRAWN AEROBIC AND ANAEROBIC Blood Culture adequate volume Performed at [facility] [address] [place], [place] [address]    Culture   Final    NO GROWTH 1 DAY Performed at [facility], [place] [address] [place], [place] [address]    Report Status PENDING  Incomplete  Culture, blood (routine x 2)     Status: None (Preliminary result)   Collection Time: 10/07/17  7:55 AM  Result Value Ref Range Status   Specimen Description   Final    BLOOD LEFT ARM Performed at [facility] [address] [place], [place] [address]    Special Requests   Final    BOTTLES DRAWN AEROBIC AND ANAEROBIC Blood Culture adequate volume Performed at [facility] [address] [place], [place] [address]    Culture   Final    NO GROWTH 1 DAY Performed at [facility], [place] [address] [place], [place] [address]    Report Status PENDING  Incomplete  Culture, expectorated sputum-assessment     Status: None   Collection Time: 10/08/17 11:51 AM  Result Value Ref Range Status   Specimen Description SPUTUM  Final   Special Requests NONE  Final   Sputum evaluation   Final    THIS SPECIMEN IS ACCEPTABLE FOR SPUTUM CULTURE Performed at [facility], [place] [address] [place],  [address]    Report Status 10/08/2017 FINAL  Final      Radiology Studies: Dg Chest 2 View  Result Date: 10/06/2017 CLINICAL DATA:  Dyspnea EXAM: CHEST  2 VIEW COMPARISON:  08/01/2017 FINDINGS: Hyperinflation. Diffuse reticular opacity consistent with chronic interstitial disease. Left lung base more focal opacity. No significant pleural effusion. Stable cardiomediastinal silhouette with aortic atherosclerosis. No pneumothorax. IMPRESSION: 1. Hyperinflation with diffuse reticulation consistent with emphysematous disease and chronic interstitial changes 2. Increased airspace disease at the left lung base, suspicious for a pneumonia. Imaging follow-up to resolution is recommended Electronically Signed   By: Donavan Foil M.D.   On: 10/06/2017 22:49      Medications:  Scheduled: . guaiFENesin  600 mg Oral BID   Continuous: . levofloxacin (LEVAQUIN) IV 750 mg (10/07/17 0523)  . pantoprozole (PROTONIX) infusion 8 mg/hr (10/08/17 1200)   IDP:OEUMPNTIRWERX, ipratropium-albuterol, morphine injection, ondansetron (ZOFRAN) IV, oxyCODONE **OR** oxyCODONE  Assessment/Plan:  Principal Problem:   Anemia due to GI blood loss Active Problems:   Symptomatic anemia   Duodenal ulcer   Malnutrition of moderate degree    Anemia due to GI blood loss Likely from prior NSAID use. Hgb 7.2. It was 9.1 on discharge on 08/06/17. EGD at that time done by Dr. Milus Mallick multiple non bleeding duodenal ulcers and duodenal mass (thought malignant) which was biopsied.  Path with peptic duodenitis with erosion, ulceration, inflammation and mild chornic gastritis.   Nometaplasia or H. pylori identified. Pt [facility] for follow up EGDand colonoscopy.Noncompliance with Protonix due to financial problems.  Seen by gastroenterology.  Underwent another upper endoscopy today.  Nonbleeding gastric and duodenal ulcers were noted. No  mass seen. Continue IV PPI for another day.  Peptic ulcer disease  Gastric and duodenal ulcers noted on upper endoscopy.  Continue PPI for now.    Community-acquired pneumonia Left base pneumonia noted.  Continue Levaquin.  Mucinex.  COPD Appears stable. Duonebs PRN  DVT prophylaxis: SCD Code Status: full code Family Communication: Discussed with the patient Disposition Plan: Mobilize.    LOS: 1 day   [facility] Pager [phone] 10/08/2017, 3:49 PM  If 7PM-7AM, please contact night-coverage at [email or web address], password [facility]

## 2017-10-08 NOTE — Progress Notes (Signed)
Occupational Therapy Treatment Patient Details Name: Erica York MRN: [id number] DOB: 27-Feb-1941 Today's Date: 10/08/2017    History of present illness 77 yo female admitted with anemia, dyspnea. Hx of COPD, anxiety, RLS, duodenal ulcers, hernia repair, UGIB   OT comments  Pt seen for initial evaluation. She needs set up/supervision and doesn't like to use AD (which she has) but she showed no LOB.  Pt has a supportive family who live 15 minutes away.  She reports that she was tired prior to admission; reviewed energy conservation and encouraged her to balance activity and rest to build endurance.   Pt verbalizes understanding  Follow Up Recommendations  Supervision - Intermittent    Equipment Recommendations  None recommended by OT    Recommendations for Other Services      Precautions / Restrictions Precautions Precautions: Fall Restrictions Weight Bearing Restrictions: No       Mobility Bed Mobility Overal bed mobility: Modified Independent                Transfers   Equipment used: None   Sit to Stand: Supervision              Balance                                           ADL either performed or assessed with clinical judgement   ADL Overall ADL's : Needs assistance/impaired                         Toilet Transfer: Supervision/safety;Min guard;Ambulation             General ADL Comments: pt is able to perform adls with set up/supervision to gather items.  She is steady but walks quickly. Has a cane and 4 wheel walker at home but really hasn't used it.  Educated on energy conservation.   Encouraged pt to think about a seat in her shower for energy conservation, breaking up showering and dressing. She plans to sponge bathe initially.    Vision       Perception     Praxis      Cognition Arousal/Alertness: Awake/alert Behavior During Therapy: WFL for tasks assessed/performed Overall Cognitive Status:  Within Functional Limits for tasks assessed                                          Exercises     Shoulder Instructions       General Comments dyspnea 2/4 with activity; no 02 used    Pertinent Vitals/ Pain       Pain Assessment: No/denies pain  Home Living Family/patient expects to be discharged to:: Private residence Living Arrangements: Alone Available Help at Discharge: Family Type of Home: House             Bathroom Shower/Tub: Teaching laboratory technician Toilet: Standard     Home Equipment: Environmental consultant - 4 wheels;Grab bars - tub/shower   Additional Comments: children live 15 min away      Prior Functioning/Environment Level of Independence: Independent with assistive device(s)        Comments: uses rollator PRN   Frequency           Progress Toward Goals  OT Goals(current goals can  now be found in the care plan section)     Acute Rehab OT Goals Patient Stated Goal: none stated  Plan      Co-evaluation                 AM-PAC PT "6 Clicks" Daily Activity     Outcome Measure   Help from another person eating meals?: None Help from another person taking care of personal grooming?: A Little Help from another person toileting, which includes using toliet, bedpan, or urinal?: A Little Help from another person bathing (including washing, rinsing, drying)?: A Little Help from another person to put on and taking off regular upper body clothing?: A Little Help from another person to put on and taking off regular lower body clothing?: A Little 6 Click Score: 19    End of Session    OT Visit Diagnosis: Muscle weakness (generalized) (M62.81)   Activity Tolerance Patient tolerated treatment well   Patient Left in bed;with call bell/phone within reach;with bed alarm set   Nurse Communication          Time: 0174-9449 OT Time Calculation (min): 17 min  Charges: OT General Charges $OT Visit: 1 Visit OT Evaluation $OT  Eval Low Complexity: 1 Low  Lesle Chris, OTR/L [phone] 10/08/2017   Erica York 10/08/2017, 3:53 PM

## 2017-10-08 NOTE — Op Note (Signed)
[facility] Patient Name: Erica York Procedure Date: 10/08/2017 MRN: [id number] Attending MD: Arta Silence , MD Date of Birth: [birth year] CSN: [id number] Age: 77 Admit Type: Inpatient Procedure:                Upper GI endoscopy Indications:              Upper abdominal pain, Acute post hemorrhagic                            anemia, Melena Providers:                Arta Silence, MD, Cleda Daub, RN, Laurena Spies, Technician Referring MD:              Medicines:                Monitored Anesthesia Care Complications:            No immediate complications. Estimated Blood Loss:     Estimated blood loss: none. Procedure:                Pre-Anesthesia Assessment:                           - Prior to the procedure, a History and Physical                            was performed, and patient medications and                            allergies were reviewed. [name] tolerance of                            previous anesthesia was also reviewed. The risks                            and benefits of the procedure and the sedation                            options and risks were discussed with the patient.                            All questions were answered, and informed consent                            was obtained. Prior Anticoagulants: The patient has                            taken no previous anticoagulant or antiplatelet                            agents. ASA Grade Assessment: III - A patient with                            severe systemic  disease. After reviewing the risks                            and benefits, the patient was deemed in                            satisfactory condition to undergo the procedure.                           After obtaining informed consent, the endoscope was                            passed under direct vision. Throughout the                            procedure, the patient's blood  pressure, pulse, and                            oxygen saturations were monitored continuously. The                            [id number]) scope was introduced through the                            mouth, and advanced to the second part of duodenum.                            The upper GI endoscopy was accomplished without                            difficulty. The patient tolerated the procedure                            well. Scope In: Scope Out: Findings:      The examined esophagus was normal.      A small hiatal hernia was present.      Many non-bleeding cratered gastric ulcers with no stigmata of bleeding       were found in the prepyloric region of the stomach. The largest lesion       was 10 mm in largest dimension.      The exam of the stomach was otherwise normal.      One non-bleeding cratered duodenal ulcer with red flat pigmented       material was found in the very distal duodenal bulb; no obvious visible       vessel, clot or active bleeding. Post-ulcer edema, prevented passage of       endoscope, though a discrete lumen was seen. The lesion was 10 mm in       largest dimension.      No old or fresh blood was seen to extent of the examination. Impression:               - Normal esophagus.                           - Small hiatal hernia.                           -  Non-bleeding gastric ulcers with no stigmata of                            bleeding.                           - One non-bleeding duodenal ulcer with pigmented                            material. Moderate Sedation:      None Recommendation:           - Return patient to hospital ward for ongoing care.                           - Full liquid diet today.                           - Continue present medications, including PPI gtt                            for at least the next 24 hours.                           - Serial CBCs.                           - Check gastrin and H. pylori serologies in the                             morning.                           - Avoid NSAIDs.                           - [facility] will follow.                           - Patient is very frail and cachectic; ulcers are                            not malignant appearing (and biopsies not                            suggestive of malignancy); however, even if                            malignant, her functional status is too poor for                            consideration of any kind of treatment at the                            present time. Procedure Code(s):        --- Professional ---  83662, Esophagogastroduodenoscopy, flexible,                            transoral; diagnostic, including collection of                            specimen(s) by brushing or washing, when performed                            (separate procedure) Diagnosis Code(s):        --- Professional ---                           K44.9, Diaphragmatic hernia without obstruction or                            gangrene                           K25.9, Gastric ulcer, unspecified as acute or                            chronic, without hemorrhage or perforation                           K26.9, Duodenal ulcer, unspecified as acute or                            chronic, without hemorrhage or perforation                           R10.10, Upper abdominal pain, unspecified                           D62, Acute posthemorrhagic anemia                           K92.1, Melena (includes Hematochezia) CPT copyright 2016 [employer]. All rights reserved. The codes documented in this report are preliminary and upon coder review may  be revised to meet current compliance requirements. Arta Silence, MD 10/08/2017 10:55:13 AM This report has been signed electronically. Number of Addenda: 0

## 2017-10-09 ENCOUNTER — Encounter (HOSPITAL_COMMUNITY): Payer: Self-pay | Admitting: Gastroenterology

## 2017-10-09 DIAGNOSIS — K269 Duodenal ulcer, unspecified as acute or chronic, without hemorrhage or perforation: Secondary | ICD-10-CM

## 2017-10-09 LAB — CBC
HCT: 27.4 % — ABNORMAL LOW (ref 36.0–46.0)
Hemoglobin: 7.9 g/dL — ABNORMAL LOW (ref 12.0–15.0)
MCH: 21.1 pg — AB (ref 26.0–34.0)
MCHC: 28.8 g/dL — ABNORMAL LOW (ref 30.0–36.0)
MCV: 73.3 fL — AB (ref 78.0–100.0)
Platelets: 300 10*3/uL (ref 150–400)
RBC: 3.74 MIL/uL — ABNORMAL LOW (ref 3.87–5.11)
RDW: 20 % — AB (ref 11.5–15.5)
WBC: 7.4 10*3/uL (ref 4.0–10.5)

## 2017-10-09 LAB — BASIC METABOLIC PANEL
ANION GAP: 8 (ref 5–15)
BUN: 8 mg/dL (ref 6–20)
CALCIUM: 8.4 mg/dL — AB (ref 8.9–10.3)
CO2: 20 mmol/L — ABNORMAL LOW (ref 22–32)
Chloride: 110 mmol/L (ref 101–111)
Creatinine, Ser: 0.91 mg/dL (ref 0.44–1.00)
GFR calc Af Amer: >60 mL/min (ref >60)
GLUCOSE: 87 mg/dL (ref 65–99)
Potassium: 3.5 mmol/L (ref 3.5–5.1)
SODIUM: 138 mmol/L (ref 135–145)

## 2017-10-09 MED ORDER — PANTOPRAZOLE SODIUM 40 MG PO TBEC
40.0000 mg | DELAYED_RELEASE_TABLET | Freq: Two times a day (BID) | ORAL | Status: DC
  Administered 2017-10-09 – 2017-10-12 (×7): 40 mg via ORAL
  Filled 2017-10-09 (×7): qty 1

## 2017-10-09 MED ORDER — ROPINIROLE HCL 1 MG PO TABS
1.0000 mg | ORAL_TABLET | Freq: Every day | ORAL | Status: DC
  Administered 2017-10-09 – 2017-10-11 (×3): 1 mg via ORAL
  Filled 2017-10-09 (×3): qty 1

## 2017-10-09 MED ORDER — ALPRAZOLAM 0.5 MG PO TABS
0.5000 mg | ORAL_TABLET | Freq: Three times a day (TID) | ORAL | Status: DC | PRN
  Administered 2017-10-09 – 2017-10-11 (×3): 0.5 mg via ORAL
  Filled 2017-10-09 (×3): qty 1

## 2017-10-09 MED ORDER — LEVOFLOXACIN 750 MG PO TABS
750.0000 mg | ORAL_TABLET | ORAL | Status: DC
  Administered 2017-10-11: 750 mg via ORAL
  Filled 2017-10-09: qty 1

## 2017-10-09 NOTE — Progress Notes (Signed)
[facility] PROGRESS NOTE  Erica York GGY:[id number] DOB: 1941/02/07 DOA: 10/06/2017  PCP: System, Pcp Not In  Brief History/Interval Summary: Erica York a 77 y.o.femalewith medical history significantfor COPD,duodenal ulcers,depression,who presented to the ED with complaints of abdominal pain,generalized weakness and difficulty breathingfor 4 days.Patient also reported intermittent dark stools annd4 episodes of loose stools.She reports 2episodes of bloody vomitus. Patientrecentlyhad a prolonged hospital admission 08/01/17- 08/08/17,managed for upper GI bleed,requiringICUadmissionwith Hgb 6 with hypotension.EGD showed few nonbleeding cratered duodenal ulcers with no stigmata of bleeding in the duodenum,large ulcerated mass with no bleeding in first part of duodenum,biopsy showed peptic duodenitis with ulceration and erosion,also with chronic gastritis, nomalignancy.  Reason for Visit: Peptic ulcer disease  Consultants: Gastroenterology  Procedures:   EGD Impression:              - Normal esophagus. - Small hiatal hernia. - Non-bleeding gastric ulcers with no stigmata of bleeding. - One non-bleeding duodenal ulcer with pigmented material.  Antibiotics: Levaquin  Subjective/Interval History: Patient states that she is feeling well.  Continues to have some discomfort in the upper abdomen.  No nausea vomiting.  Shortness of breath has improved.  Cough is improved.    ROS: Denies any headaches.  Objective:  Vital Signs  Vitals:   10/08/17 1100 10/08/17 1251 10/08/17 1512 10/08/17 2158  BP: 119/63 128/69  119/73  Pulse: 85 91  91  Resp: 16 16  18   Temp:  98.2 F (36.8 C)  99 F (37.2 C)  TempSrc:  Oral  Oral  SpO2: 92% 95% 93% 93%  Weight:      Height:        Intake/Output Summary (Last 24 hours) at 10/09/2017 0858 Last data filed at 10/09/2017 0600 Gross per 24 hour  Intake 1370 ml  Output 300 ml  Net 1070 ml   Filed  Weights   10/06/17 1832 10/07/17 0054 10/08/17 0957  Weight: 44.7 kg (98 lb 8 oz) 43.4 kg (95 lb 10.9 oz) 43.1 kg (95 lb)    General appearance: Awake alert.  In no distress. Resp: Improving air entry bilaterally.  Fewer crackles.  No wheezing or rhonchi. Cardio: S1-S2 is normal regular.  No S3-S4.  No rubs murmurs or bruit GI: Abdomen remains mildly tender in the epigastric area without any rebound rigidity or guarding.  Bowel sounds are present.    Extremities: extremities normal, atraumatic, no cyanosis or edema Neurologic: No focal deficits  Lab Results:  Data Reviewed: I have personally reviewed following labs and imaging studies  CBC: Recent Labs  Lab 10/06/17 1834 10/07/17 0755 10/08/17 0435 10/09/17 0432  WBC 9.6 9.1 8.2 7.4  HGB 7.2* 7.9* 8.4* 7.9*  HCT 24.9* 26.7* 29.0* 27.4*  MCV 68.4* 72.0* 72.7* 73.3*  PLT 346 283 264 035    Basic Metabolic Panel: Recent Labs  Lab 10/06/17 1834 10/08/17 0435 10/09/17 0432  NA 138 139 138  K 3.6 3.8 3.5  CL 109 111 110  CO2 19* 21* 20*  GLUCOSE 111* 88 87  BUN 14 9 8   CREATININE 0.89 0.93 0.91  CALCIUM 9.2 8.8* 8.4*  MG  --  1.8  --     GFR: Estimated Creatinine Clearance: 36.3 mL/min (by C-G formula based on SCr of 0.91 mg/dL).  Liver Function Tests: Recent Labs  Lab 10/06/17 1834 10/08/17 0435  AST 16 13*  ALT 10* 7*  ALKPHOS 118 105  BILITOT 0.3 0.4  PROT 7.0 6.3*  ALBUMIN 3.6 3.1*    Recent  Labs  Lab 10/06/17 1834  LIPASE 21     Recent Results (from the past 240 hour(s))  Culture, blood (routine x 2)     Status: None (Preliminary result)   Collection Time: 10/07/17  7:54 AM  Result Value Ref Range Status   Specimen Description   Final    BLOOD RIGHT ANTECUBITAL Performed at [facility] [address] [place], [place] [address]    Special Requests   Final    BOTTLES DRAWN AEROBIC AND ANAEROBIC Blood Culture adequate volume Performed at [facility] [address] [place], [place] [address]    Culture   Final    NO GROWTH 1 DAY Performed at [facility], [place] [address], [place], [place] [address]    Report Status PENDING  Incomplete  Culture, blood (routine x 2)     Status: None (Preliminary result)   Collection Time: 10/07/17  7:55 AM  Result Value Ref Range Status   Specimen Description   Final    BLOOD LEFT ARM Performed at [facility] [address] [place], [place] [address]    Special Requests   Final    BOTTLES DRAWN AEROBIC AND ANAEROBIC Blood Culture adequate volume Performed at [facility] [address], [place], [place] [address]    Culture   Final    NO GROWTH 1 DAY Performed at [facility], [place] [address], [place], [place] [address]    Report Status PENDING  Incomplete  Culture, expectorated sputum-assessment     Status: None   Collection Time: 10/08/17 11:51 AM  Result Value Ref Range Status   Specimen Description SPUTUM  Final   Special Requests NONE  Final   Sputum evaluation   Final    THIS SPECIMEN IS ACCEPTABLE FOR SPUTUM CULTURE Performed at [facility], [place] [address] [place], [place] [address]    Report Status 10/08/2017 FINAL  Final  Culture, respiratory (NON-Expectorated)     Status: None (Preliminary result)   Collection Time: 10/08/17 11:51 AM  Result Value Ref Range Status   Specimen Description   Final    SPUTUM Performed at [facility] [address], [place], [place] [address]    Special Requests   Final    NONE Reflexed from [id number] Performed at [facility], [place] [address], [place], [place] [address]    Gram Stain   Final    MODERATE WBC PRESENT, PREDOMINANTLY PMN ABUNDANT GRAM POSITIVE COCCI FEW YEAST FEW GRAM POSITIVE RODS Performed at [facility], [place] [address], [place], [place] [address]    Culture PENDING  Incomplete   Report Status PENDING   Incomplete      Radiology Studies: No results found.   Medications:  Scheduled: . guaiFENesin  600 mg Oral BID   Continuous: . levofloxacin (LEVAQUIN) IV Stopped (10/09/17 3474)  . pantoprozole (PROTONIX) infusion 8 mg/hr (10/09/17 0449)   QVZ:DGLOVFIEPPIRJ, ipratropium-albuterol, morphine injection, ondansetron (ZOFRAN) IV, oxyCODONE **OR** oxyCODONE  Assessment/Plan:  Principal Problem:   Anemia due to GI blood loss Active Problems:   Symptomatic anemia   Duodenal ulcer   Malnutrition of moderate degree    Anemia due to GI blood loss Likely from prior NSAID use. Hgb 7.2. It was 9.1 on discharge on 08/06/17.  Hemoglobin has been stable.    Peptic ulcer disease  EGD done during her hospitalization in December 2018 by Dr. 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  Path with peptic duodenitis with erosion, ulceration, inflammation and mild chornic gastritis.   Nometaplasia or H. pylori identified. [facility] for follow up EGDand colonoscopy.Noncompliance with Protonix due to financial problems.  Gastroenterology was again consulted.  Patient underwent EGD on 2/20. Nonbleeding gastric and duodenal ulcers were noted. No mass seen.  Discussed with Dr. Paulita Fujita today.  Change to twice daily PPI.  Monitor for additional day.  Community-acquired pneumonia Left base pneumonia noted.  Seems to be improving.  Continue Levaquin.  Will change to oral Levaquin today.  Mucinex.  COPD Appears stable. Duonebs PRN  DVT prophylaxis: SCD Code Status: full code Family Communication: Discussed with the patient Disposition Plan: Continue to mobilize.  Anticipate discharge tomorrow.    LOS: 2 days   [facility] Pager [phone] 10/09/2017, 8:58 AM  If 7PM-7AM, please contact night-coverage at [email or web address], password [facility]

## 2017-10-09 NOTE — Progress Notes (Signed)
Physical Therapy Treatment/Discharge Note Patient Details Name: Erica York MRN: [id number] DOB: 1940/10/10 Today's Date: 10/09/2017    History of Present Illness 77 yo female admitted with anemia, dyspnea. Hx of COPD, anxiety, RLS, duodenal ulcers, hernia repair, UGIB    PT Comments    Progressing well with mobility. Pt was Mod Ind with mobility on today. Do not anticipate any further PT needs. Will d/c pt from PT caseload. Please reorder if mobility needs change.    Follow Up Recommendations  Supervision for mobility/OOB     Equipment Recommendations  None recommended by PT    Recommendations for Other Services       Precautions / Restrictions Precautions Precautions: Fall Restrictions Weight Bearing Restrictions: No    Mobility  Bed Mobility Overal bed mobility: Modified Independent                Transfers Overall transfer level: Modified independent     Sit to Stand: Modified independent (Device/Increase time)            Ambulation/Gait Ambulation/Gait assistance: Modified independent (Device/Increase time) Ambulation Distance (Feet): 500 Feet   Gait Pattern/deviations: WFL(Within Functional Limits)     General Gait Details: Pt tolerated distance well. dyspnea 2/4   Stairs            Wheelchair Mobility    Modified Rankin (Stroke Patients Only)       Balance                                            Cognition Arousal/Alertness: Awake/alert Behavior During Therapy: WFL for tasks assessed/performed Overall Cognitive Status: Within Functional Limits for tasks assessed                                        Exercises      General Comments        Pertinent Vitals/Pain Pain Assessment: No/denies pain    Home Living                      Prior Function            PT Goals (current goals can now be found in the care plan section) Progress towards PT goals: Progressing  toward goals    Frequency    Min 3X/week      PT Plan Current plan remains appropriate    Co-evaluation              AM-PAC PT "6 Clicks" Daily Activity  Outcome Measure  Difficulty turning over in bed (including adjusting bedclothes, sheets and blankets)?: None Difficulty moving from lying on back to sitting on the side of the bed? : None Difficulty sitting down on and standing up from a chair with arms (e.g., wheelchair, bedside commode, etc,.)?: None Help needed moving to and from a bed to chair (including a wheelchair)?: None Help needed walking in hospital room?: None Help needed climbing 3-5 steps with a railing? : None 6 Click Score: 24    End of Session   Activity Tolerance: Patient tolerated treatment well Patient left: in bed;with call bell/phone within reach         Time: 1350-1358 PT Time Calculation (min) (ACUTE ONLY): 8 min  Charges:  $Gait Training: 8-22 mins  G Codes:          Weston Anna, MPT Pager: [phone]

## 2017-10-09 NOTE — Progress Notes (Signed)
Subjective: Abdomen "sore" and "crampy." No hematemesis or blood in stool.  Objective: Vital signs in last 24 hours: Temp:  [98.2 F (36.8 C)-99 F (37.2 C)] 99 F (37.2 C) (02/20 2158) Pulse Rate:  [85-93] 91 (02/20 2158) Resp:  [16-20] 18 (02/20 2158) BP: (105-133)/(53-81) 119/73 (02/20 2158) SpO2:  [92 %-96 %] 93 % (02/20 2158) Weight:  [95 lb (43.1 kg)] 95 lb (43.1 kg) (02/20 0957) Weight change:  Last BM Date: 10/08/17  PE: GEN:  Thin, cachectic, NAD ABD:  Mild protuberant, mild generalized tenderness (no change in 24 hours)  Lab Results: CBC    Component Value Date/Time   WBC 7.4 10/09/2017 0432   RBC 3.74 (L) 10/09/2017 0432   HGB 7.9 (L) 10/09/2017 0432   HCT 27.4 (L) 10/09/2017 0432   PLT 300 10/09/2017 0432   MCV 73.3 (L) 10/09/2017 0432   MCH 21.1 (L) 10/09/2017 0432   MCHC 28.8 (L) 10/09/2017 0432   RDW 20.0 (H) 10/09/2017 0432   LYMPHSABS 1.0 08/04/2017 0307   MONOABS 0.4 08/04/2017 0307   EOSABS 0.3 08/04/2017 0307   BASOSABS 0.0 08/04/2017 0307   CMP     Component Value Date/Time   NA 138 10/09/2017 0432   K 3.5 10/09/2017 0432   CL 110 10/09/2017 0432   CO2 20 (L) 10/09/2017 0432   GLUCOSE 87 10/09/2017 0432   BUN 8 10/09/2017 0432   CREATININE 0.91 10/09/2017 0432   CALCIUM 8.4 (L) 10/09/2017 0432   PROT 6.3 (L) 10/08/2017 0435   ALBUMIN 3.1 (L) 10/08/2017 0435   AST 13 (L) 10/08/2017 0435   ALT 7 (L) 10/08/2017 0435   ALKPHOS 105 10/08/2017 0435   BILITOT 0.4 10/08/2017 0435   GFRNONAA >60 10/09/2017 0432   GFRAA >60 10/09/2017 0432   Assessment:  1.  Abdominal pain, likely from #3 below. 2.  Pneumonia, on antibiotics. 3.  Gastric and duodenal ulcers.  Surreptitious NSAIDs vs. H. Pylori vs other (prior biopsies negative for malignancy).  Plan:  1.  Transition to oral PPI bid today. 2.  Advance diet slowly. 3.  Awaiting gastrin and H. Pylori studies. 4.  Avoid NSAIDs. 5.  Hopefully home in the next couple days; pending patient's  clinical course, might consider repeat endoscopy in 2-3 months to assess for ulcer healing. 6.  Case discussed with Dr. [place] Pink.   Landry Dyke 10/09/2017, 9:55 AM   Cell [phone] If no answer or after 5 PM call [phone]

## 2017-10-10 ENCOUNTER — Inpatient Hospital Stay (HOSPITAL_COMMUNITY): Payer: Medicare Other

## 2017-10-10 DIAGNOSIS — J181 Lobar pneumonia, unspecified organism: Secondary | ICD-10-CM

## 2017-10-10 LAB — CBC
HCT: 30.8 % — ABNORMAL LOW (ref 36.0–46.0)
HEMOGLOBIN: 8.9 g/dL — AB (ref 12.0–15.0)
MCH: 21.1 pg — AB (ref 26.0–34.0)
MCHC: 28.9 g/dL — ABNORMAL LOW (ref 30.0–36.0)
MCV: 73 fL — ABNORMAL LOW (ref 78.0–100.0)
PLATELETS: 301 10*3/uL (ref 150–400)
RBC: 4.22 MIL/uL (ref 3.87–5.11)
RDW: 20.1 % — ABNORMAL HIGH (ref 11.5–15.5)
WBC: 8.4 10*3/uL (ref 4.0–10.5)

## 2017-10-10 LAB — H. PYLORI ANTIBODY, IGG: H Pylori IgG: <0.8 Index Value (ref 0.00–0.79)

## 2017-10-10 LAB — GASTRIN: GASTRIN: 110 pg/mL (ref 0–115)

## 2017-10-10 MED ORDER — OXYCODONE HCL 5 MG PO TABS: 5.0000 mg | ORAL_TABLET | ORAL | 0 refills | Status: DC | PRN

## 2017-10-10 MED ORDER — LEVOFLOXACIN 750 MG PO TABS: 750.0000 mg | ORAL_TABLET | ORAL | 0 refills | Status: AC

## 2017-10-10 MED ORDER — ALPRAZOLAM 0.5 MG PO TABS: 0.5000 mg | ORAL_TABLET | Freq: Three times a day (TID) | ORAL | 0 refills | Status: DC | PRN

## 2017-10-10 MED ORDER — ALBUTEROL SULFATE HFA 108 (90 BASE) MCG/ACT IN AERS: 2.0000 | INHALATION_SPRAY | Freq: Four times a day (QID) | RESPIRATORY_TRACT | 2 refills | Status: DC | PRN

## 2017-10-10 MED ORDER — SUCRALFATE 1 GM/10ML PO SUSP
1.0000 g | Freq: Three times a day (TID) | ORAL | Status: DC
  Administered 2017-10-10 – 2017-10-12 (×9): 1 g via ORAL
  Filled 2017-10-10 (×9): qty 10

## 2017-10-10 MED ORDER — GUAIFENESIN ER 600 MG PO TB12: 600.0000 mg | ORAL_TABLET | Freq: Two times a day (BID) | ORAL | 0 refills | Status: DC

## 2017-10-10 MED ORDER — ROPINIROLE HCL 1 MG PO TABS: 1.0000 mg | ORAL_TABLET | Freq: Every evening | ORAL | 0 refills | Status: DC | PRN

## 2017-10-10 MED ORDER — PANTOPRAZOLE SODIUM 40 MG PO TBEC: 40.0000 mg | DELAYED_RELEASE_TABLET | Freq: Two times a day (BID) | ORAL | 1 refills | Status: DC

## 2017-10-10 NOTE — Progress Notes (Signed)
Nutrition Brief Note  RD noted pt's diet advanced from clear liquid to full liquid on 10/08/17 and from full liquid to soft on 10/09/17.  RD will order Magic cup TID with meals, each supplement provides 290 kcal and 9 grams of protein.  Current diet order is soft, patient is consuming approximately 100% of meals at this time. Labs and medications reviewed.   Will continue to follow pt.  Gaynell Face, MS, RD, LDN Pager: [phone] Weekend/After Hours: [phone]

## 2017-10-10 NOTE — Progress Notes (Signed)
Pharmacy Antibiotic Note  Clayton Jarmon is a 77 y.o. female admitted on 10/06/2017 with pneumonia.  Pharmacy has been consulted for levaquin dosing.  Today, 10/10/2017:  D4 Levaquin  Afebrile since admission, WBC wnl  SCr at baseline  Plan:  Continue Levaquin 750 mg IV q48h  Recommend limiting abx to 5 days for CAP if clinically stable  Height: 5\' 1"  (154.9 cm) Weight: 95 lb (43.1 kg) IBW/kg (Calculated) : 47.8  Temp (24hrs), Avg:98.9 F (37.2 C), Min:98.7 F (37.1 C), Max:99.1 F (37.3 C)  Recent Labs  Lab 10/06/17 1834 10/07/17 0755 10/08/17 0435 10/09/17 0432 10/10/17 0403  WBC 9.6 9.1 8.2 7.4 8.4  CREATININE 0.89  --  0.93 0.91  --     Estimated Creatinine Clearance: 36.3 mL/min (by C-G formula based on SCr of 0.91 mg/dL).    Allergies  Allergen Reactions  . Zosyn [Piperacillin Sod-Tazobactam So] Swelling and Rash    Antimicrobials this admission: 2/19 levaquin >>   Microbiology results: 2/19 BCx: ngtd 2/20 Sput Cx: reincubated  Thank you for allowing pharmacy to be a part of this patient's care.  Reuel Boom, PharmD, BCPS [phone] 10/10/2017, 1:11 PM

## 2017-10-10 NOTE — Care Management Important Message (Signed)
Important Message  Patient Details  Name: Erica York MRN: [id number] Date of Birth: 26-Apr-1941   Medicare Important Message Given:  Yes    Kerin Salen 10/10/2017, 12:31 [place] Message  Patient Details  Name: Erica York MRN: [id number] Date of Birth: 01/27/1941   Medicare Important Message Given:  Yes    Kerin Salen 10/10/2017, 12:31 PM

## 2017-10-10 NOTE — Progress Notes (Signed)
Subjective: Ongoing abdominal "burning". Having nausea and some vomiting.  Objective: Vital signs in last 24 hours: Temp:  [98.7 F (37.1 C)-99.1 F (37.3 C)] 99.1 F (37.3 C) (02/22 0515) Pulse Rate:  [93-100] 96 (02/22 0515) Resp:  [18] 18 (02/22 0515) BP: (128-144)/(75-83) 142/75 (02/22 0515) SpO2:  [92 %] 92 % (02/22 0515) Weight change:  Last BM Date: 10/10/17  PE: GEN:  Thin and cachectic-appearing ABD:  Soft, mild distended, generalized tenderness without peritonitis  Lab Results: CBC    Component Value Date/Time   WBC 8.4 10/10/2017 0403   RBC 4.22 10/10/2017 0403   HGB 8.9 (L) 10/10/2017 0403   HCT 30.8 (L) 10/10/2017 0403   PLT 301 10/10/2017 0403   MCV 73.0 (L) 10/10/2017 0403   MCH 21.1 (L) 10/10/2017 0403   MCHC 28.9 (L) 10/10/2017 0403   RDW 20.1 (H) 10/10/2017 0403   LYMPHSABS 1.0 08/04/2017 0307   MONOABS 0.4 08/04/2017 0307   EOSABS 0.3 08/04/2017 0307   BASOSABS 0.0 08/04/2017 0307   CMP     Component Value Date/Time   NA 138 10/09/2017 0432   K 3.5 10/09/2017 0432   CL 110 10/09/2017 0432   CO2 20 (L) 10/09/2017 0432   GLUCOSE 87 10/09/2017 0432   BUN 8 10/09/2017 0432   CREATININE 0.91 10/09/2017 0432   CALCIUM 8.4 (L) 10/09/2017 0432   PROT 6.3 (L) 10/08/2017 0435   ALBUMIN 3.1 (L) 10/08/2017 0435   AST 13 (L) 10/08/2017 0435   ALT 7 (L) 10/08/2017 0435   ALKPHOS 105 10/08/2017 0435   BILITOT 0.4 10/08/2017 0435   GFRNONAA >60 10/09/2017 0432   GFRAA >60 10/09/2017 0432   Gastrin pending H. Pylori negative ABD xray:  Non specific bowel gas pattern; no obvious obstruction or free air  Assessment:  1.  Abdominal pain, likely from #3 below. 2.  Pneumonia, on antibiotics. 3.  Gastric and duodenal ulcers.  Surreptitious NSAIDs vs. H. Pylori vs other (prior biopsies negative for malignancy).  Plan:  1.  Continue PPI. 2.  Add sucralfate suspension. 3.  Decrease diet to full liquid. 4.  If discomfort and nausea/vomiting persist,  consider repeat CT scan over the weekend. 5.  [facility] will follow.   Erica York 10/10/2017, 11:49 AM   Cell [phone] If no answer or after 5 PM call [phone]

## 2017-10-10 NOTE — Progress Notes (Signed)
Spoke with pt concerning not having money to pay for medications. Pt states that she was not able to get her medications because the doctor she was seeing would not write scripts for her to purchase. Per pt she has  prescription coverage. Pt's PCP is Dr. Clyde Lundborg [phone], appt March 4th. Unable to assist pt with medications related to the pt having insurance.

## 2017-10-10 NOTE — Progress Notes (Signed)
[facility] PROGRESS NOTE  Erica York YQM:[id number] DOB: June 18, 1941 DOA: 10/06/2017  PCP: System, Pcp Not In  Brief History/Interval Summary: Erica York a 77 y.o.femalewith medical history significantfor COPD,duodenal ulcers,depression,who presented to the ED with complaints of abdominal pain,generalized weakness and difficulty breathingfor 4 days.Patient also reported intermittent dark stools annd4 episodes of loose stools.She reports 2episodes of bloody vomitus. Patientrecentlyhad a prolonged hospital admission 08/01/17- 08/08/17,managed for upper GI bleed,requiringICUadmissionwith Hgb 6 with hypotension.EGD showed few nonbleeding cratered duodenal ulcers with no stigmata of bleeding in the duodenum,large ulcerated mass with no bleeding in first part of duodenum,biopsy showed peptic duodenitis with ulceration and erosion,also with chronic gastritis, nomalignancy.  Reason for Visit: Peptic ulcer disease  Consultants: Gastroenterology  Procedures:   EGD Impression:              - Normal esophagus. - Small hiatal hernia. - Non-bleeding gastric ulcers with no stigmata of bleeding. - One non-bleeding duodenal ulcer with pigmented material.  Antibiotics: Levaquin  Subjective/Interval History: Patient states that she feels nauseated this morning.  She has had a few dry heaves.  The abdominal discomfort is about the same.  No other complaints offered.  Cough is improved.    ROS: Denies any headaches  Objective:  Vital Signs  Vitals:   10/08/17 2158 10/09/17 1251 10/09/17 2026 10/10/17 0515  BP: 119/73 128/79 (!) 144/83 (!) 142/75  Pulse: 91 93 100 96  Resp: 18 18 18 18   Temp: 99 F (37.2 C) 98.9 F (37.2 C) 98.7 F (37.1 C) 99.1 F (37.3 C)  TempSrc: Oral Oral Oral Oral  SpO2: 93% 92% 92% 92%  Weight:      Height:        Intake/Output Summary (Last 24 hours) at 10/10/2017 0846 Last data filed at 10/10/2017 0200 Gross per 24  hour  Intake 240 ml  Output 1 ml  Net 239 ml   Filed Weights   10/06/17 1832 10/07/17 0054 10/08/17 0957  Weight: 44.7 kg (98 lb 8 oz) 43.4 kg (95 lb 10.9 oz) 43.1 kg (95 lb)    General appearance: Awake alert.  In no distress. Resp: Coarse breath sounds bilaterally.  No obvious wheezing or rhonchi.  Few crackles at the bases. Cardio: S1-S2 is normal regular.  No S3-S4.  No rubs murmurs or bruit GI: Abdomen remains mildly tender in the epigastric area without any rebound rigidity or guarding.  Bowel sounds are present and normal.  No masses organomegaly.   Extremities: No edema Neurologic: No focal deficits  Lab Results:  Data Reviewed: I have personally reviewed following labs and imaging studies  CBC: Recent Labs  Lab 10/06/17 1834 10/07/17 0755 10/08/17 0435 10/09/17 0432 10/10/17 0403  WBC 9.6 9.1 8.2 7.4 8.4  HGB 7.2* 7.9* 8.4* 7.9* 8.9*  HCT 24.9* 26.7* 29.0* 27.4* 30.8*  MCV 68.4* 72.0* 72.7* 73.3* 73.0*  PLT 346 283 264 300 889    Basic Metabolic Panel: Recent Labs  Lab 10/06/17 1834 10/08/17 0435 10/09/17 0432  NA 138 139 138  K 3.6 3.8 3.5  CL 109 111 110  CO2 19* 21* 20*  GLUCOSE 111* 88 87  BUN 14 9 8   CREATININE 0.89 0.93 0.91  CALCIUM 9.2 8.8* 8.4*  MG  --  1.8  --     GFR: Estimated Creatinine Clearance: 36.3 mL/min (by C-G formula based on SCr of 0.91 mg/dL).  Liver Function Tests: Recent Labs  Lab 10/06/17 1834 10/08/17 0435  AST 16 13*  ALT 10*  7*  ALKPHOS 118 105  BILITOT 0.3 0.4  PROT 7.0 6.3*  ALBUMIN 3.6 3.1*    Recent Labs  Lab 10/06/17 1834  LIPASE 21     Recent Results (from the past 240 hour(s))  Culture, blood (routine x 2)     Status: None (Preliminary result)   Collection Time: 10/07/17  7:54 AM  Result Value Ref Range Status   Specimen Description   Final    BLOOD RIGHT ANTECUBITAL Performed at [facility] [address] [place], [place] [address]    Special Requests   Final     BOTTLES DRAWN AEROBIC AND ANAEROBIC Blood Culture adequate volume Performed at [facility] [address], [place], [place] [address]    Culture   Final    NO GROWTH 2 DAYS Performed at [facility] [address], [place], [place] [address]    Report Status PENDING  Incomplete  Culture, blood (routine x 2)     Status: None (Preliminary result)   Collection Time: 10/07/17  7:55 AM  Result Value Ref Range Status   Specimen Description   Final    BLOOD LEFT ARM Performed at [facility] [address], [place], [place] [address]    Special Requests   Final    BOTTLES DRAWN AEROBIC AND ANAEROBIC Blood Culture adequate volume Performed at [facility] [address] [place], [place] [address]    Culture   Final    NO GROWTH 2 DAYS Performed at [facility] [address], [place], [place] [address]    Report Status PENDING  Incomplete  Culture, expectorated sputum-assessment     Status: None   Collection Time: 10/08/17 11:51 AM  Result Value Ref Range Status   Specimen Description SPUTUM  Final   Special Requests NONE  Final   Sputum evaluation   Final    THIS SPECIMEN IS ACCEPTABLE FOR SPUTUM CULTURE Performed at [facility], [place] [address] [place], [place] [address]    Report Status 10/08/2017 FINAL  Final  Culture, respiratory (NON-Expectorated)     Status: None (Preliminary result)   Collection Time: 10/08/17 11:51 AM  Result Value Ref Range Status   Specimen Description   Final    SPUTUM Performed at [facility] [address], [place], [place] [address]    Special Requests   Final    NONE Reflexed from [id number] Performed at [facility], [place] [address] [place], [place] [address]    Gram Stain   Final    MODERATE WBC PRESENT, PREDOMINANTLY PMN ABUNDANT GRAM POSITIVE COCCI FEW YEAST FEW GRAM POSITIVE RODS    Culture   Final    TOO  YOUNG TO READ Performed at [facility], [place] [address], [place], [place] [address]    Report Status PENDING  Incomplete      Radiology Studies: No results found.   Medications:  Scheduled: . guaiFENesin  600 mg Oral BID  . [START ON 10/11/2017] levofloxacin  750 mg Oral Q48H  . pantoprazole  40 mg Oral BID  . rOPINIRole  1 mg Oral QHS   Continuous:  LHT:[id number], ALPRAZolam, ipratropium-albuterol, morphine injection, ondansetron (ZOFRAN) IV, oxyCODONE **OR** oxyCODONE  Assessment/Plan:  Principal Problem:   Anemia due to GI blood loss Active Problems:   Symptomatic anemia   Duodenal ulcer   Malnutrition of moderate degree    Anemia due to GI  blood loss Likely from prior NSAID use. Hgb 7.2. It was 9.1 on discharge on 08/06/17.  Hemoglobin has been stable.    Nausea and dry heaves Patient feels worse this morning.  Abdominal x-ray was done which did not show any obstruction or free air.  Seen by gastroenterology who has downgraded her diet.  Continue PPI for now.  Peptic ulcer disease  EGD done during her hospitalization in December 2018 by Dr. Watt Climes showed multiple non bleeding duodenal ulcers and duodenal mass (thought malignant) which was biopsied.  Path with peptic duodenitis with erosion, ulceration, inflammation and mild chornic gastritis.   Nometaplasia or H. pylori identified. [facility] up EGDand colonoscopy.Noncompliance with Protonix due to financial problems.  Gastroenterology was again consulted.  Patient underwent EGD on 2/20. Nonbleeding gastric and duodenal ulcers were noted. No mass seen.  H. pylori was less than 0.8.  Gastrin level is pending.  See above.  Continue PPI.  Community-acquired pneumonia Left base pneumonia.  Seems to be improving.  Continue Levaquin.  Mucinex.  COPD Appears stable. Duonebs PRN  DVT prophylaxis: SCD Code Status: full code Family Communication: Discussed with the  patient Disposition Plan: Continue to mobilize as tolerated.  Patient feels worse today.  Not ready for discharge.    LOS: 3 days   [facility] Pager [phone] 10/10/2017, 8:46 AM  If 7PM-7AM, please contact night-coverage at [email or web address], password [facility]

## 2017-10-10 NOTE — Progress Notes (Signed)
CSW consulted for "cannot afford medications". CSW notified patient's RNCM about consult. CSW signing off, no other needs identified at this time.  Abundio Miu, [place] Social Worker [facility] Cell#: [phone]

## 2017-10-11 ENCOUNTER — Inpatient Hospital Stay (HOSPITAL_COMMUNITY): Payer: Medicare Other

## 2017-10-11 ENCOUNTER — Encounter (HOSPITAL_COMMUNITY): Payer: Self-pay | Admitting: Radiology

## 2017-10-11 LAB — COMPREHENSIVE METABOLIC PANEL
ALT: 8 U/L — ABNORMAL LOW (ref 14–54)
ANION GAP: 11 (ref 5–15)
AST: 15 U/L (ref 15–41)
Albumin: 3.1 g/dL — ABNORMAL LOW (ref 3.5–5.0)
Alkaline Phosphatase: 101 U/L (ref 38–126)
BUN: 13 mg/dL (ref 6–20)
CHLORIDE: 107 mmol/L (ref 101–111)
CO2: 21 mmol/L — AB (ref 22–32)
Calcium: 8.7 mg/dL — ABNORMAL LOW (ref 8.9–10.3)
Creatinine, Ser: 0.85 mg/dL (ref 0.44–1.00)
GFR calc non Af Amer: >60 mL/min (ref >60)
Glucose, Bld: 101 mg/dL — ABNORMAL HIGH (ref 65–99)
Potassium: 3.2 mmol/L — ABNORMAL LOW (ref 3.5–5.1)
SODIUM: 139 mmol/L (ref 135–145)
Total Bilirubin: 0.4 mg/dL (ref 0.3–1.2)
Total Protein: 6.3 g/dL — ABNORMAL LOW (ref 6.5–8.1)

## 2017-10-11 LAB — CULTURE, RESPIRATORY: CULTURE: NORMAL

## 2017-10-11 LAB — CBC
HCT: 29.9 % — ABNORMAL LOW (ref 36.0–46.0)
Hemoglobin: 8.9 g/dL — ABNORMAL LOW (ref 12.0–15.0)
MCH: 21.8 pg — AB (ref 26.0–34.0)
MCHC: 29.8 g/dL — AB (ref 30.0–36.0)
MCV: 73.1 fL — ABNORMAL LOW (ref 78.0–100.0)
PLATELETS: 307 10*3/uL (ref 150–400)
RBC: 4.09 MIL/uL (ref 3.87–5.11)
RDW: 20.4 % — AB (ref 11.5–15.5)
WBC: 6.3 10*3/uL (ref 4.0–10.5)

## 2017-10-11 LAB — CULTURE, RESPIRATORY W GRAM STAIN

## 2017-10-11 MED ORDER — IOPAMIDOL (ISOVUE-300) INJECTION 61%
INTRAVENOUS | Status: AC
  Administered 2017-10-11: 30 mL via ORAL
  Filled 2017-10-11: qty 30

## 2017-10-11 MED ORDER — POTASSIUM CHLORIDE CRYS ER 20 MEQ PO TBCR
40.0000 meq | EXTENDED_RELEASE_TABLET | Freq: Two times a day (BID) | ORAL | Status: AC
  Administered 2017-10-11 (×2): 40 meq via ORAL
  Filled 2017-10-11 (×2): qty 2

## 2017-10-11 MED ORDER — IOPAMIDOL (ISOVUE-300) INJECTION 61%
INTRAVENOUS | Status: AC
Start: 2017-10-11 — End: 2017-10-11
  Administered 2017-10-11: 100 mL via INTRAVENOUS
  Filled 2017-10-11: qty 100

## 2017-10-11 MED ORDER — SODIUM CHLORIDE 0.9 % IJ SOLN
INTRAMUSCULAR | Status: AC
  Administered 2017-10-11: 10 mL
  Filled 2017-10-11: qty 50

## 2017-10-11 NOTE — Progress Notes (Signed)
Erica York 11:31 AM  Subjective: Patient doing well with only minimal upper discomfort and tolerating full liquids and requests soft solids and patient known to me from previous hospital stay and her hospital records reviewed and her case discussed with the hospital team and my partner Dr. Paulita Fujita and she has been using generic nonsteroidals for headaches and has a primary appointment early March and has no new complaints  Objective: Vital signs stable afebrile no acute distress lungs are clear heart regular rate and rhythm abdomen is soft very minimal midepigastric discomfort occasional bowel sounds labs stable KUB okay  Assessment: Ulcer disease probably secondary to nonsteroidal use with abnormal CAT scan in December  Plan: Okay to advance to soft solids but would repeat CAT scan just to be sure and she'll need to be on pump inhibitors until ulcer heals and you'll need to reiterate no aspirin or nonsteroidals and she might need a different headache medicine and I am happy to see back in the office in follow-up and will check on tomorrow  [facility]  Pager [phone] After 5PM or if no answer call [phone]

## 2017-10-11 NOTE — Plan of Care (Signed)
  Progressing Health Behavior/Discharge Planning: Ability to manage health-related needs will improve 10/11/2017 2243 - Progressing by Talbert Forest, RN Nutrition: Adequate nutrition will be maintained 10/11/2017 2243 - Progressing by Talbert Forest, RN Pain Managment: General experience of comfort will improve 10/11/2017 2243 - Progressing by Talbert Forest, RN

## 2017-10-11 NOTE — Progress Notes (Signed)
[facility] PROGRESS NOTE  Erica York HYW:[id number] DOB: Apr 17, 1941 DOA: 10/06/2017  PCP: System, Pcp Not In  Brief History/Interval Summary: Erica York a 77 y.o.femalewith medical history significantfor COPD,duodenal ulcers,depression,who presented to the ED with complaints of abdominal pain,generalized weakness and difficulty breathingfor 4 days.Patient also reported intermittent dark stools annd4 episodes of loose stools.She reports 2episodes of bloody vomitus. Patientrecentlyhad a prolonged hospital admission 08/01/17- 08/08/17,managed for upper GI bleed,requiringICUadmissionwith Hgb 6 with hypotension.EGD showed few nonbleeding cratered duodenal ulcers with no stigmata of bleeding in the duodenum,large ulcerated mass with no bleeding in first part of duodenum,biopsy showed peptic duodenitis with ulceration and erosion,also with chronic gastritis, nomalignancy. Patient with persistent upper abdominal discomfort.  To undergo repeat CT scan today.  Reason for Visit: Peptic ulcer disease  Consultants: Gastroenterology  Procedures:   EGD Impression:              - Normal esophagus. - Small hiatal hernia. - Non-bleeding gastric ulcers with no stigmata of bleeding. - One non-bleeding duodenal ulcer with pigmented material.  Antibiotics: Levaquin  Subjective/Interval History: Patient is eating better this morning.  Tolerating her full liquid diet without any difficulty.  Continues to have upper abdominal discomfort without any changes.  She mentions that she has a mesh there from previous surgery.  No further episodes of nausea and vomiting since yesterday.    ROS: Denies any headaches.  Objective:  Vital Signs  Vitals:   10/10/17 0515 10/10/17 1340 10/10/17 2109 10/11/17 0506  BP: (!) 142/75 110/67 128/74 121/61  Pulse: 96 (!) 102 (!) 102 85  Resp: 18 16 18 16   Temp: 99.1 F (37.3 C) 98.1 F (36.7 C) 99.4 F (37.4 C) 98.7 F  (37.1 C)  TempSrc: Oral Oral Oral Oral  SpO2: 92% 95% 95% 91%  Weight:      Height:        Intake/Output Summary (Last 24 hours) at 10/11/2017 0901 Last data filed at 10/11/2017 0615 Gross per 24 hour  Intake 60 ml  Output -  Net 60 ml   Filed Weights   10/06/17 1832 10/07/17 0054 10/08/17 0957  Weight: 44.7 kg (98 lb 8 oz) 43.4 kg (95 lb 10.9 oz) 43.1 kg (95 lb)    General appearance: Awake alert.  In no distress. Resp: Good air entry bilaterally.  Few crackles at the bases.  No wheezing or rhonchi. Cardio: S1-S2 is normal regular.  No S3-S4 GI: Abdomen remains mildly tender in the epigastric area without any no masses organomegaly.  Bowel sounds are present and Normal.    Extremities: No edema Neurologic: Awake alert.  No obvious focal neurological deficits.  Lab Results:  Data Reviewed: I have personally reviewed following labs and imaging studies  CBC: Recent Labs  Lab 10/07/17 0755 10/08/17 0435 10/09/17 0432 10/10/17 0403 10/11/17 0440  WBC 9.1 8.2 7.4 8.4 6.3  HGB 7.9* 8.4* 7.9* 8.9* 8.9*  HCT 26.7* 29.0* 27.4* 30.8* 29.9*  MCV 72.0* 72.7* 73.3* 73.0* 73.1*  PLT 283 264 300 301 485    Basic Metabolic Panel: Recent Labs  Lab 10/06/17 1834 10/08/17 0435 10/09/17 0432 10/11/17 0440  NA 138 139 138 139  K 3.6 3.8 3.5 3.2*  CL 109 111 110 107  CO2 19* 21* 20* 21*  GLUCOSE 111* 88 87 101*  BUN 14 9 8 13   CREATININE 0.89 0.93 0.91 0.85  CALCIUM 9.2 8.8* 8.4* 8.7*  MG  --  1.8  --   --  GFR: Estimated Creatinine Clearance: 38.9 mL/min (by C-G formula based on SCr of 0.85 mg/dL).  Liver Function Tests: Recent Labs  Lab 10/06/17 1834 10/08/17 0435 10/11/17 0440  AST 16 13* 15  ALT 10* 7* 8*  ALKPHOS 118 105 101  BILITOT 0.3 0.4 0.4  PROT 7.0 6.3* 6.3*  ALBUMIN 3.6 3.1* 3.1*    Recent Labs  Lab 10/06/17 1834  LIPASE 21     Recent Results (from the past 240 hour(s))  Culture, blood (routine x 2)     Status: None (Preliminary result)    Collection Time: 10/07/17  7:54 AM  Result Value Ref Range Status   Specimen Description   Final    BLOOD RIGHT ANTECUBITAL Performed at [facility], [place] [address], [place], [place] [address]    Special Requests   Final    BOTTLES DRAWN AEROBIC AND ANAEROBIC Blood Culture adequate volume Performed at [facility] [address], [place], [place] [address]    Culture   Final    NO GROWTH 3 DAYS Performed at [facility], [place] [address], [place], [place] [address]    Report Status PENDING  Incomplete  Culture, blood (routine x 2)     Status: None (Preliminary result)   Collection Time: 10/07/17  7:55 AM  Result Value Ref Range Status   Specimen Description   Final    BLOOD LEFT ARM Performed at [facility] [address], [place], [place] [address]    Special Requests   Final    BOTTLES DRAWN AEROBIC AND ANAEROBIC Blood Culture adequate volume Performed at [facility] [address] [place], [place] [address]    Culture   Final    NO GROWTH 3 DAYS Performed at [facility], [place] [address] [place], [place] [address]    Report Status PENDING  Incomplete  Culture, expectorated sputum-assessment     Status: None   Collection Time: 10/08/17 11:51 AM  Result Value Ref Range Status   Specimen Description SPUTUM  Final   Special Requests NONE  Final   Sputum evaluation   Final    THIS SPECIMEN IS ACCEPTABLE FOR SPUTUM CULTURE Performed at [facility], [place] [address], [place], [place] [address]    Report Status 10/08/2017 FINAL  Final  Culture, respiratory (NON-Expectorated)     Status: None (Preliminary result)   Collection Time: 10/08/17 11:51 AM  Result Value Ref Range Status   Specimen Description   Final    SPUTUM Performed at [facility] [address] [place], [place] [address]    Special Requests   Final    NONE Reflexed from  [id number] Performed at [facility], [place] [address], [place], [place] [address]    Gram Stain   Final    MODERATE WBC PRESENT, PREDOMINANTLY PMN ABUNDANT GRAM POSITIVE COCCI FEW YEAST FEW GRAM POSITIVE RODS    Culture   Final    CULTURE REINCUBATED FOR BETTER GROWTH Performed at [facility], [place] [address], [place], [place] [address]    Report Status PENDING  Incomplete      Radiology Studies: Dg Abd 2 Views  Result Date: 10/10/2017 CLINICAL DATA:  Nausea, abdominal pain EXAM: ABDOMEN - 2 VIEW COMPARISON:  None. FINDINGS: Postoperative changes from cholecystectomy and prior bowel surgery. Air-fluid level noted in the pelvis. This is felt to most likely be within a loop of colon, nonspecific.  No convincing evidence for bowel obstruction. No free air organomegaly. Levoscoliosis in the lumbar spine with associated degenerative changes. IMPRESSION: Nonspecific bowel gas pattern. No convincing evidence for bowel obstruction. There is a dilated mid pelvic bowel loop with air-fluid level, felt to most likely reflect colon. No free air. Electronically Signed   By: Rolm Baptise M.D.   On: 10/10/2017 09:38     Medications:  Scheduled: . guaiFENesin  600 mg Oral BID  . levofloxacin  750 mg Oral Q48H  . pantoprazole  40 mg Oral BID  . rOPINIRole  1 mg Oral QHS  . sucralfate  1 g Oral TID WC & HS   Continuous:  CBJ:[id number], ALPRAZolam, ipratropium-albuterol, morphine injection, ondansetron (ZOFRAN) IV, oxyCODONE **OR** oxyCODONE  Assessment/Plan:  Principal Problem:   Anemia due to GI blood loss Active Problems:   Symptomatic anemia   Duodenal ulcer   Malnutrition of moderate degree    Anemia due to GI blood loss Likely from prior NSAID use. Hgb 7.2. It was 9.1 on discharge on 08/06/17.  Hemoglobin has been stable.    Nausea and dry heaves Patient with few episodes of nausea and dry heaving on 2/22.  Her diet was downgraded.  Patient feels better  today.  Plan is to repeat CT scan today.  Continue PPI.    Peptic ulcer disease  EGD done during her hospitalization in December 2018 by Dr. Watt Climes showed multiple non bleeding duodenal ulcers and duodenal mass (thought malignant) which was biopsied.  Path with peptic duodenitis with erosion, ulceration, inflammation and mild chornic gastritis.   Nometaplasia or H. pylori identified. [facility] up EGDand colonoscopy.Noncompliance with Protonix due to financial problems.  Gastroenterology was again consulted.  Patient underwent EGD on 2/20. Nonbleeding gastric and duodenal ulcers were noted. No mass seen.  H. pylori was less than 0.8.  Gastrin level is in normal range as well.  Continue PPI.  Community-acquired pneumonia Pneumonia is improving.  Continue Levaquin for 2 more doses.  Mucinex.  Sputum cultures pending.    COPD Appears stable. Duonebs PRN  DVT prophylaxis: SCD Code Status: full code Family Communication: Discussed with the patient Disposition Plan: Mobilize as tolerated.  CT scan to be repeated today.  GI is following.      LOS: 4 days   [facility] Pager [phone] 10/11/2017, 9:01 AM  If 7PM-7AM, please contact night-coverage at [email or web address], password [facility]

## 2017-10-12 LAB — CULTURE, BLOOD (ROUTINE X 2)
Culture: NO GROWTH
Culture: NO GROWTH
SPECIAL REQUESTS: ADEQUATE
Special Requests: ADEQUATE

## 2017-10-12 MED ORDER — IPRATROPIUM-ALBUTEROL 0.5-2.5 (3) MG/3ML IN SOLN: 3.0000 mL | RESPIRATORY_TRACT | 0 refills | Status: DC | PRN

## 2017-10-12 MED ORDER — DICYCLOMINE HCL 10 MG PO CAPS: 10.0000 mg | ORAL_CAPSULE | Freq: Three times a day (TID) | ORAL | 0 refills | Status: DC | PRN

## 2017-10-12 MED ORDER — SUCRALFATE 1 GM/10ML PO SUSP: 1.0000 g | Freq: Three times a day (TID) | ORAL | 0 refills | Status: DC

## 2017-10-12 NOTE — Discharge Summary (Signed)
[facility]  Physician Discharge Summary   Patient ID: Erica York MRN: [id number] DOB/AGE: 04-20-1941 77 y.o.  Admit date: 10/06/2017 Discharge date: 10/12/2017  PCP: System, Pcp Not In  DISCHARGE DIAGNOSES:  Principal Problem:   Anemia due to GI blood loss Active Problems:   Symptomatic anemia   Duodenal ulcer   Malnutrition of moderate degree   RECOMMENDATIONS FOR OUTPATIENT FOLLOW UP: 1. Outpatient follow-up with gastroenterology 2. Patient instructed to avoid NSAID use   DISCHARGE CONDITION: fair  Diet recommendation: Soft diet  Filed Weights   10/06/17 1832 10/07/17 0054 10/08/17 0957  Weight: 44.7 kg (98 lb 8 oz) 43.4 kg (95 lb 10.9 oz) 43.1 kg (95 lb)    INITIAL HISTORY: Erica McLarenis a 77 y.o.femalewith medical history significantfor COPD,duodenal ulcers,depression,who presented to the ED with complaints of abdominal pain,generalized weakness and difficulty breathingfor 4 days.Patient also reported intermittent dark stools annd4 episodes of loose stools.She reports 2episodes of bloody vomitus. Patientrecentlyhad a prolonged hospital admission 08/01/17- 08/08/17,managed for upper GI bleed,requiringICUadmissionwith Hgb 6 with hypotension.EGD showed few nonbleeding cratered duodenal ulcers with no stigmata of bleeding in the duodenum,large ulcerated mass with no bleeding in first part of duodenum,biopsy showed peptic duodenitis with ulceration and erosion,also with chronic gastritis, nomalignancy.  Consultants: Gastroenterology  Procedures:   EGD Impression:              - Normal esophagus. - Small hiatal hernia. - Non-bleeding gastric ulcers with no stigmata of bleeding. - One non-bleeding duodenal ulcer with pigmented material.   HOSPITAL COURSE:    Anemia due to GI blood loss Patient was transfused.  Hemoglobin has been stable.  Nausea and dry heaves Patient with few episodes of nausea and dry heaving on  2/22.  Her diet was downgraded.  Symptoms most likely due to peptic ulcer disease.  Abdominal films did not show any acute findings.  CT scan of the abdomen was repeated which also did not show any acute findings.  Patient has improved.  Tolerating her diet now.  Peptic ulcer disease  EGD done during her hospitalization in December 2018 by Dr. Watt Climes showed multiple non bleeding duodenal ulcers and duodenal mass (thought malignant) which was biopsied.  Path with peptic duodenitis with erosion, ulceration, inflammation and mild chornic gastritis.   Nometaplasia or H. pylori identified. [facility] follow up EGDand colonoscopy.Noncompliance with Protonix due to financial problems.  Gastroenterology was again consulted.  Patient underwent EGD on 2/20. Nonbleeding gastric and duodenal ulcers were noted. No mass seen.  H. pylori was less than 0.8.  Gastrin level is in normal range as well.  Continue PPI.  Discussed with gastroenterology today.  Okay for discharge.  Community-acquired pneumonia Pneumonia is improving.  Complete antibiotic course of Levaquin.  Urine culture negative.  COPD Appears stable. Duonebs PRN  Overall stable.  Okay for discharge home today.    PERTINENT LABS:  The results of significant diagnostics from this hospitalization (including imaging, microbiology, ancillary and laboratory) are listed below for reference.    Microbiology: Recent Results (from the past 240 hour(s))  Culture, blood (routine x 2)     Status: None (Preliminary result)   Collection Time: 10/07/17  7:54 AM  Result Value Ref Range Status   Specimen Description   Final    BLOOD RIGHT ANTECUBITAL Performed at [facility] [address] [place], [place] [address]    Special Requests   Final    BOTTLES DRAWN AEROBIC  AND ANAEROBIC Blood Culture adequate volume Performed at [facility] [address] [place], [place]  [address]    Culture   Final    NO GROWTH 4 DAYS Performed at [facility], [place] [address] [place], [place] [address]    Report Status PENDING  Incomplete  Culture, blood (routine x 2)     Status: None (Preliminary result)   Collection Time: 10/07/17  7:55 AM  Result Value Ref Range Status   Specimen Description   Final    BLOOD LEFT ARM Performed at [facility] [address] [place], [place] [address]    Special Requests   Final    BOTTLES DRAWN AEROBIC AND ANAEROBIC Blood Culture adequate volume Performed at [facility] [address] [place], [place] [address]    Culture   Final    NO GROWTH 4 DAYS Performed at [facility], [place] [address], [place], [place] [address]    Report Status PENDING  Incomplete  Culture, expectorated sputum-assessment     Status: None   Collection Time: 10/08/17 11:51 AM  Result Value Ref Range Status   Specimen Description SPUTUM  Final   Special Requests NONE  Final   Sputum evaluation   Final    THIS SPECIMEN IS ACCEPTABLE FOR SPUTUM CULTURE Performed at [facility], [place] [address] [place], [place] [address]    Report Status 10/08/2017 FINAL  Final  Culture, respiratory (NON-Expectorated)     Status: None   Collection Time: 10/08/17 11:51 AM  Result Value Ref Range Status   Specimen Description   Final    SPUTUM Performed at [facility] [address], [place], [place] [address]    Special Requests   Final    NONE Reflexed from [id number] Performed at [facility], [place] [address], [place], [place] [address]    Gram Stain   Final    MODERATE WBC PRESENT, PREDOMINANTLY PMN ABUNDANT GRAM POSITIVE COCCI FEW YEAST FEW GRAM POSITIVE RODS    Culture   Final    Consistent with normal respiratory flora. Performed at [facility], [place] [address] [place], [place] [address]    Report Status 10/11/2017 FINAL  Final      Labs: Basic Metabolic Panel: Recent Labs  Lab 10/06/17 1834 10/08/17 0435 10/09/17 0432 10/11/17 0440  NA 138 139 138 139  K 3.6 3.8 3.5 3.2*  CL 109 111 110 107  CO2 19* 21* 20* 21*  GLUCOSE 111* 88 87 101*  BUN 14 9 8 13   CREATININE 0.89 0.93 0.91 0.85  CALCIUM 9.2 8.8* 8.4* 8.7*  MG  --  1.8  --   --    Liver Function Tests: Recent Labs  Lab 10/06/17 1834 10/08/17 0435 10/11/17 0440  AST 16 13* 15  ALT 10* 7* 8*  ALKPHOS 118 105 101  BILITOT 0.3 0.4 0.4  PROT 7.0 6.3* 6.3*  ALBUMIN 3.6 3.1* 3.1*   Recent Labs  Lab 10/06/17 1834  LIPASE 21   CBC: Recent Labs  Lab 10/07/17 0755 10/08/17 0435 10/09/17 0432 10/10/17 0403 10/11/17 0440  WBC 9.1 8.2 7.4 8.4 6.3  HGB 7.9* 8.4* 7.9* 8.9* 8.9*  HCT 26.7* 29.0* 27.4* 30.8* 29.9*  MCV 72.0* 72.7* 73.3* 73.0* 73.1*  PLT 283 264 300 301 307    IMAGING STUDIES Dg Chest 2 View  Result Date: 10/06/2017 CLINICAL DATA:  Dyspnea EXAM: CHEST  2 VIEW COMPARISON:  08/01/2017 FINDINGS: Hyperinflation. Diffuse reticular opacity consistent with chronic interstitial disease. Left lung base more focal opacity. No significant pleural effusion. Stable cardiomediastinal silhouette with aortic atherosclerosis. No pneumothorax. IMPRESSION: 1. Hyperinflation with diffuse reticulation consistent with emphysematous disease and chronic interstitial changes 2. Increased airspace disease at the left lung base, suspicious for a pneumonia. Imaging follow-up to resolution is recommended Electronically Signed   By: Donavan Foil M.D.   On: 10/06/2017 22:49   Ct Abdomen Pelvis W Contrast  Result Date: 10/11/2017 CLINICAL DATA:  Abdominal pain for 4 days.  Hematemesis and melena. EXAM: CT ABDOMEN AND PELVIS WITH CONTRAST TECHNIQUE: Multidetector CT imaging of the abdomen and pelvis was performed using the standard protocol following bolus administration of intravenous contrast. CONTRAST:  13mL ISOVUE-300 IOPAMIDOL (ISOVUE-300) INJECTION 61%  COMPARISON:  08/01/2017 FINDINGS: Lower Chest: Increased subsegmental atelectasis in left lower lobe. Pulmonary emphysema again noted. Several bibasilar sub-cm pulmonary nodules are stable. Hepatobiliary: No hepatic masses identified. Prior cholecystectomy again noted. Diffuse biliary ductal dilatation his mild decrease since previous study. Pancreas: No mass or inflammatory changes. Diffuse pancreatic atrophy again noted. Spleen: Within normal limits in size. A sub-cm low-attenuation lesion in the posterior aspect of the spleen is stable and consistent with benign etiology. Adrenals/Urinary Tract: Prior left nephrectomy again noted. A few tiny right renal cysts are seen, however there is no evidence of renal mass or hydronephrosis. Stomach/Bowel: No evidence of obstruction, inflammatory process or abnormal fluid collections. Vascular/Lymphatic: No pathologically enlarged lymph nodes. Aortic atherosclerosis. 3.0 cm saccular aneurysm of the infrarenal abdominal aorta remains stable. Reproductive: Prior hysterectomy noted. Adnexal regions are unremarkable in appearance. Other:  None. Musculoskeletal: No suspicious bone lesions identified. Old L2 and L4 vertebral body compression fracture deformities again seen. IMPRESSION: No acute findings within the abdomen or pelvis. Increased left lower lobe subsegmental atelectasis. Stable 3.0 cm saccular abdominal aortic aneurysm. Recommend followup by ultrasound in 3 years. This recommendation follows ACR consensus guidelines: White Paper of the ACR Incidental Findings Committee II on Vascular Findings. Natasha [employer] Coll Radiol 2013; 10:789-794 Electronically Signed   By: Earle Gell M.D.   On: 10/11/2017 16:31   Dg Abd 2 Views  Result Date: 10/10/2017 CLINICAL DATA:  Nausea, abdominal pain EXAM: ABDOMEN - 2 VIEW COMPARISON:  None. FINDINGS: Postoperative changes from cholecystectomy and prior bowel surgery. Air-fluid level noted in the pelvis. This is felt to most likely be within  a loop of colon, nonspecific. No convincing evidence for bowel obstruction. No free air organomegaly. Levoscoliosis in the lumbar spine with associated degenerative changes. IMPRESSION: Nonspecific bowel gas pattern. No convincing evidence for bowel obstruction. There is a dilated mid pelvic bowel loop with air-fluid level, felt to most likely reflect colon. No free air. Electronically Signed   By: Rolm Baptise M.D.   On: 10/10/2017 09:38    DISCHARGE EXAMINATION: Vitals:   10/11/17 0506 10/11/17 1431 10/11/17 2120 10/12/17 0503  BP: 121/61 138/82 119/72 122/78  Pulse: 85 68 92 97  Resp: 16 14 18 14   Temp: 98.7 F (37.1 C) 98.5 F (36.9 C) 98.2 F (36.8 C) 97.7 F (36.5 C)  TempSrc: Oral Oral Oral Oral  SpO2: 91%  92% 92%  Weight:      Height:       General appearance: alert, cooperative, appears stated age and no distress Resp: Coarse breath sounds bilaterally with a few crackles in the bases.  Normal effort.  No wheezing. Cardio: regular rate and rhythm, S1, S2  normal, no murmur, click, rub or gallop GI: Lightly tender in the epigastric area without any rebound rigidity or guarding.  No masses organomegaly.  DISPOSITION: Home  Discharge Instructions    Call MD for:  difficulty breathing, headache or visual disturbances   Complete by:  As directed    Call MD for:  persistant dizziness or light-headedness   Complete by:  As directed    Call MD for:  persistant nausea and vomiting   Complete by:  As directed    Call MD for:  severe uncontrolled pain   Complete by:  As directed    Call MD for:  temperature >100.4   Complete by:  As directed    Discharge instructions   Complete by:  As directed    Please stop taking motrin and other NSAIDS. Follow up with Dr. Watt Climes in 3 weeks. Take soft/bland diet for now.  You were cared for by a hospitalist during your hospital stay. If you have any questions about your discharge medications or the care you received while you were in the  hospital after you are discharged, you can call the unit and asked to speak with the hospitalist on call if the hospitalist that took care of you is not available. Once you are discharged, your primary care physician will handle any further medical issues. Please note that NO REFILLS for any discharge medications will be authorized once you are discharged, as it is imperative that you return to your primary care physician (or establish a relationship with a primary care physician if you do not have one) for your aftercare needs so that they can reassess your need for medications and monitor your lab values. If you do not have a primary care physician, you can call [phone] for a physician referral.   Increase activity slowly   Complete by:  As directed         Allergies as of 10/12/2017      Reactions   Zosyn [piperacillin Sod-tazobactam So] Swelling, Rash      Medication List    TAKE these medications   albuterol 108 (90 Base) MCG/ACT inhaler Commonly known as:  PROVENTIL HFA;VENTOLIN HFA Inhale 2 puffs into the lungs every 6 (six) hours as needed for wheezing or shortness of breath.   ALPRAZolam 0.5 MG tablet Commonly known as:  XANAX Take 1 tablet (0.5 mg total) by mouth 3 (three) times daily as needed for anxiety.   colesevelam 625 MG tablet Commonly known as:  WELCHOL Take 625 mg by mouth daily.   dicyclomine 10 MG capsule Commonly known as:  BENTYL Take 1 capsule (10 mg total) by mouth 3 (three) times daily as needed for spasms (bowel spasms).   guaiFENesin 600 MG 12 hr tablet Commonly known as:  MUCINEX Take 1 tablet (600 mg total) by mouth 2 (two) times daily.   ipratropium-albuterol 0.5-2.5 (3) MG/3ML Soln Commonly known as:  DUONEB Take 3 mLs by nebulization every 4 (four) hours as needed (for shortness of breath).   levofloxacin 750 MG tablet Commonly known as:  LEVAQUIN Take 1 tablet (750 mg total) by mouth every other day for 2 doses.   oxyCODONE 5 MG immediate  release tablet Commonly known as:  Oxy IR/ROXICODONE Take 1 tablet (5 mg total) by mouth every 4 (four) hours as needed for moderate pain.   pantoprazole 40 MG tablet Commonly known as:  PROTONIX Take 1 tablet (40 mg total) by mouth 2 (two) times daily before a meal.  After 1 month take 1 daily in the morning   potassium chloride SA 20 MEQ tablet Commonly known as:  K-DUR,KLOR-CON Take 20 mEq by mouth 2 (two) times daily.   rOPINIRole 1 MG tablet Commonly known as:  REQUIP Take 1 tablet (1 mg total) by mouth at bedtime as needed (Sleep).   sertraline 100 MG tablet Commonly known as:  ZOLOFT Take 1 tablet (100 mg total) by mouth at bedtime.   sucralfate 1 GM/10ML suspension Commonly known as:  CARAFATE Take 10 mLs (1 g total) by mouth 4 (four) times daily -  with meals and at bedtime.        TOTAL DISCHARGE TIME: 35 minutes  Bonnielee Haff  [facility] Pager [phone]  10/12/2017, 3:34 PM

## 2017-10-12 NOTE — Progress Notes (Signed)
[name] 9:53 AM  Subjective: Patient without any new complaints seemingly eating okay this morning and her case discussed with the hospital team as well as the patient and CT reviewed without worrisome findings  Objective: Vital signs stable afebrile no acute distress patient looks good this morning not examined labs stable CT okay  Assessment: Multiple medical complaints and problems including nonsteroidal induced duodenal ulcer  Plan: Reassurance was given continue pump inhibitors happy to see back as outpatient avoid aspirin and nonsteroidals possibly trial of antispasmodic and headache medicine per hospital team and please call us if we could be of any further assistance with this hospital stay  [facility]  Pager [phone] After 5PM or if no answer call [phone]

## 2017-11-19 ENCOUNTER — Other Ambulatory Visit: Payer: Self-pay | Admitting: Physician Assistant

## 2017-11-19 DIAGNOSIS — R112 Nausea with vomiting, unspecified: Secondary | ICD-10-CM

## 2017-11-19 DIAGNOSIS — R1032 Left lower quadrant pain: Secondary | ICD-10-CM

## 2017-11-19 DIAGNOSIS — R197 Diarrhea, unspecified: Secondary | ICD-10-CM

## 2017-11-25 ENCOUNTER — Ambulatory Visit
Admission: RE | Admit: 2017-11-25 | Discharge: 2017-11-25 | Disposition: A | Discharge status: Home / Self Care | Payer: Medicare Other | Source: Ambulatory Visit | Attending: Physician Assistant | Admitting: Physician Assistant

## 2017-11-25 DIAGNOSIS — R197 Diarrhea, unspecified: Secondary | ICD-10-CM

## 2017-11-25 DIAGNOSIS — R112 Nausea with vomiting, unspecified: Secondary | ICD-10-CM

## 2017-11-25 DIAGNOSIS — R1032 Left lower quadrant pain: Secondary | ICD-10-CM

## 2017-11-25 MED ORDER — IOPAMIDOL (ISOVUE-300) INJECTION 61%
100.0000 mL | Freq: Once | INTRAVENOUS | Status: AC | PRN
  Administered 2017-11-25: 100 mL via INTRAVENOUS

## 2018-03-13 ENCOUNTER — Other Ambulatory Visit: Payer: Self-pay | Admitting: Urology

## 2018-03-16 ENCOUNTER — Other Ambulatory Visit: Payer: Self-pay

## 2018-03-16 ENCOUNTER — Other Ambulatory Visit: Payer: Self-pay | Admitting: Urology

## 2018-03-16 ENCOUNTER — Encounter (HOSPITAL_BASED_OUTPATIENT_CLINIC_OR_DEPARTMENT_OTHER): Payer: Self-pay | Admitting: *Deleted

## 2018-03-16 NOTE — Progress Notes (Addendum)
Spoke w/ pt via phone for pre-op interview.  Npo after mn w/ exception clear liquids until 0630 (no cream/ milk products).  Arrive at 1030.  Needs istat .  Current ekg and cxr in chart and epic.  Will take am meds w/ sips of water dos.

## 2018-03-17 ENCOUNTER — Encounter (HOSPITAL_BASED_OUTPATIENT_CLINIC_OR_DEPARTMENT_OTHER): Payer: Self-pay | Admitting: *Deleted

## 2018-03-17 NOTE — H&P (Signed)
Urology Preoperative H&P   Chief Complaint: Bladder cancer  History of Present Illness: Erica York is a 77 y.o. 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 Philbert Riser.   Her last cystoscopy was in July 2018 and was positive for low grade Ta UCC.   From a urinary standpoint, she reports a good FOS and feels like she is emptying her bladder well. She has urgency/frequency throughout the day (previously treated with Myrbetriq with little improvement). Wearing 1-2 ppd. Nocturia x 1. Denies interval UTIs or dysuria.   She states that she will have sporadic episodes of painless gross hematuria with her last episode being ~2 weeks ago. She had her hemoglobin check by her PCP last week and it was 7.9 at that time. She was instructed to proceed to the ER for a possible transfusion, but the patient refused. Today, she states that she is experiencing some weakness/fatigue, but this has been an issue for the past several years.   She had a cystoscopy on 03/13/2018 that revealed multiple papillary tumors involving the bladder floor as well as a sessile appearing tumor involving the bladder dome.  CT UROGRAM (03/04/2018)  IMPRESSION:  1. Broad nodular lesion in the anterior wall the bladder. Finding  may represent bladder cancer recurrence or residual bladder cancer  or posttherapy nodular thickening.  2. Additional small sub 5 mm nodules at base of the bladder.  3. Post LEFT nephrectomy without evidence of local recurrence of  renal cell carcinoma.  4. No enhancing lesion in the RIGHT kidney. Two small cortical  hypodensities cannot be fully characterized but unchanged prior. No  nephrolithiasis or obstructive uropathy.  5. New nodular pleuroparenchymal lesion in the inferior lingula.  Recommend dedicated CT thorax versus follow-up on  routine cancer surveillance   Past Medical History:  Diagnosis Date  . Anemia   . Arthritis    hands  . Bladder cancer ([place])   . Chronic diarrhea   . Chronic gastritis   . COPD (chronic obstructive pulmonary disease) ([place])    per pt uses neb. as needed  . Depression   . Diverticulosis of colon   . Duodenal ulcer 08/01/2017   multiple per EGD  . GAD (generalized anxiety disorder)   . GERD (gastroesophageal reflux disease)   . Hiatal hernia   . History of bladder cancer 2017   s/p  TURBT's twice   . History of colonic diverticulitis   . History of GI bleed 08/01/2017   and 10-06-2017 upper GI bleed due to multiple duonenal ulcer , duodenitis , gastritis (transfused 10-06-2017)  . History of renal cell carcinoma    dx 2016---  s/p  left nephrectomy  . Hyperlipidemia   . Osteoporosis   . RLS (restless legs syndrome)   . 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 Urge incontinence of urine     Past Surgical History:  Procedure Laterality Date  . ABDOMINAL HYSTERECTOMY  1973   w/ BSO and Appendectomy  . COLON SURGERY  2015   in [place]   "my colon was reconstructed"  . ESOPHAGOGASTRODUODENOSCOPY N/A 08/06/2017   Procedure: ESOPHAGOGASTRODUODENOSCOPY (EGD);  Surgeon: Clarene Essex, MD;  Location: [facility];  Service: Endoscopy;  Laterality: N/A;  . ESOPHAGOGASTRODUODENOSCOPY (EGD) WITH PROPOFOL Left 10/08/2017   Procedure: ESOPHAGOGASTRODUODENOSCOPY (EGD) WITH PROPOFOL;  Surgeon: Arta Silence, MD;  Location: [facility];  Service: Endoscopy;  Laterality: Left;  . HEMICOLECTOMY  x3  last one 2010   last one w/ ostomy (diverticulitis)  . INGUINAL HERNIA REPAIR Left 1980s  . LAPAROSCOPIC CHOLECYSTECTOMY  1990s  . LEFT NEPHRECTOMY  2016   in [place]   renal cell carcinoma  . TRANSURETHRAL RESECTION OF BLADDER TUMOR  x2  2017  in [place], [place]    Allergies:  Allergies  Allergen Reactions  . Zosyn [Piperacillin Sod-Tazobactam So] Swelling and Rash     History reviewed. No pertinent family history.  Social History:  reports that she has been smoking cigarettes.  She has smoked for the past 60.00 years. She has never used smokeless tobacco. She reports that she does not drink alcohol or use drugs.  ROS: A complete review of systems was performed.  All systems are negative except for pertinent findings as noted.  Physical Exam:  Vital signs in last 24 hours: Weight:  [44.9 kg (99 lb)] 44.9 kg (99 lb) (07/29 1243) Constitutional:  Alert and oriented, No acute distress Cardiovascular: Regular rate and rhythm, No JVD Respiratory: Normal respiratory effort, Lungs clear bilaterally GI: Abdomen is soft, nontender, nondistended, no abdominal masses GU: No CVA tenderness Lymphatic: No lymphadenopathy Neurologic: Grossly intact, no focal deficits Psychiatric: Normal mood and affect  Laboratory Data:  No results for input(s): WBC, HGB, HCT, PLT in the last 72 hours.  No results for input(s): NA, K, CL, GLUCOSE, BUN, CALCIUM, CREATININE in the last 72 hours.  Invalid input(s): CO3   No results found for this or any previous visit (from the past 24 hour(s)). No results found for this or any previous visit (from the past 240 hour(s)).  Renal Function: No results for input(s): CREATININE in the last 168 hours. CrCl cannot be calculated (Patient's most recent lab result is older than the maximum 21 days allowed.).  Radiologic Imaging: No results found.  I independently reviewed the above imaging studies.  Assessment and Plan Erica York is a 77 y.o. 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 The risks included, but are not limited to, bleeding, urinary tract infection,  bladder perforation requiring prolonged catheterization and/or open bladder repair, ureteral obstruction, voiding dysfunction and the inherent risks of general anesthesia. The patient voices understanding and wishes to proceed.  -H/H pending. She may need a pRBC transfusion prior to her surgery.     Ellison Hughs, MD 03/17/2018, 10:41 AM  [facility] Pager: [phone]

## 2018-03-18 ENCOUNTER — Encounter (HOSPITAL_BASED_OUTPATIENT_CLINIC_OR_DEPARTMENT_OTHER)
Admission: AC | Disposition: A | Discharge status: Home / Self Care | Payer: Self-pay | Source: Ambulatory Visit | Attending: Urology

## 2018-03-18 ENCOUNTER — Ambulatory Visit (HOSPITAL_BASED_OUTPATIENT_CLINIC_OR_DEPARTMENT_OTHER)
Admission: AC | Admit: 2018-03-18 | Discharge: 2018-03-18 | Disposition: A | Discharge status: Home / Self Care | Payer: Medicare Other | Source: Ambulatory Visit | Attending: Urology | Admitting: Urology

## 2018-03-18 ENCOUNTER — Encounter (HOSPITAL_BASED_OUTPATIENT_CLINIC_OR_DEPARTMENT_OTHER): Payer: Self-pay

## 2018-03-18 ENCOUNTER — Ambulatory Visit (HOSPITAL_BASED_OUTPATIENT_CLINIC_OR_DEPARTMENT_OTHER): Payer: Medicare Other | Admitting: Anesthesiology

## 2018-03-18 DIAGNOSIS — Z9049 Acquired absence of other specified parts of digestive tract: Secondary | ICD-10-CM | POA: Diagnosis not present

## 2018-03-18 DIAGNOSIS — C671 Malignant neoplasm of dome of bladder: Secondary | ICD-10-CM | POA: Insufficient documentation

## 2018-03-18 DIAGNOSIS — Z905 Acquired absence of kidney: Secondary | ICD-10-CM | POA: Insufficient documentation

## 2018-03-18 DIAGNOSIS — J449 Chronic obstructive pulmonary disease, unspecified: Secondary | ICD-10-CM | POA: Diagnosis not present

## 2018-03-18 DIAGNOSIS — F329 Major depressive disorder, single episode, unspecified: Secondary | ICD-10-CM | POA: Insufficient documentation

## 2018-03-18 DIAGNOSIS — Z85528 Personal history of other malignant neoplasm of kidney: Secondary | ICD-10-CM | POA: Insufficient documentation

## 2018-03-18 DIAGNOSIS — K2950 Unspecified chronic gastritis without bleeding: Secondary | ICD-10-CM | POA: Insufficient documentation

## 2018-03-18 DIAGNOSIS — K449 Diaphragmatic hernia without obstruction or gangrene: Secondary | ICD-10-CM | POA: Insufficient documentation

## 2018-03-18 DIAGNOSIS — D649 Anemia, unspecified: Secondary | ICD-10-CM | POA: Diagnosis not present

## 2018-03-18 DIAGNOSIS — M810 Age-related osteoporosis without current pathological fracture: Secondary | ICD-10-CM | POA: Diagnosis not present

## 2018-03-18 DIAGNOSIS — Z90710 Acquired absence of both cervix and uterus: Secondary | ICD-10-CM | POA: Insufficient documentation

## 2018-03-18 DIAGNOSIS — E785 Hyperlipidemia, unspecified: Secondary | ICD-10-CM | POA: Insufficient documentation

## 2018-03-18 DIAGNOSIS — R310 Gross hematuria: Secondary | ICD-10-CM | POA: Diagnosis not present

## 2018-03-18 DIAGNOSIS — K279 Peptic ulcer, site unspecified, unspecified as acute or chronic, without hemorrhage or perforation: Secondary | ICD-10-CM | POA: Insufficient documentation

## 2018-03-18 DIAGNOSIS — K219 Gastro-esophageal reflux disease without esophagitis: Secondary | ICD-10-CM | POA: Insufficient documentation

## 2018-03-18 DIAGNOSIS — Z880 Allergy status to penicillin: Secondary | ICD-10-CM | POA: Insufficient documentation

## 2018-03-18 DIAGNOSIS — M19041 Primary osteoarthritis, right hand: Secondary | ICD-10-CM | POA: Diagnosis not present

## 2018-03-18 DIAGNOSIS — N3941 Urge incontinence: Secondary | ICD-10-CM | POA: Diagnosis not present

## 2018-03-18 DIAGNOSIS — C672 Malignant neoplasm of lateral wall of bladder: Secondary | ICD-10-CM | POA: Insufficient documentation

## 2018-03-18 DIAGNOSIS — G2581 Restless legs syndrome: Secondary | ICD-10-CM | POA: Diagnosis not present

## 2018-03-18 DIAGNOSIS — C679 Malignant neoplasm of bladder, unspecified: Secondary | ICD-10-CM | POA: Diagnosis present

## 2018-03-18 DIAGNOSIS — Z8719 Personal history of other diseases of the digestive system: Secondary | ICD-10-CM | POA: Insufficient documentation

## 2018-03-18 DIAGNOSIS — K5790 Diverticulosis of intestine, part unspecified, without perforation or abscess without bleeding: Secondary | ICD-10-CM | POA: Insufficient documentation

## 2018-03-18 DIAGNOSIS — Z8551 Personal history of malignant neoplasm of bladder: Secondary | ICD-10-CM | POA: Insufficient documentation

## 2018-03-18 DIAGNOSIS — M19042 Primary osteoarthritis, left hand: Secondary | ICD-10-CM | POA: Insufficient documentation

## 2018-03-18 DIAGNOSIS — R197 Diarrhea, unspecified: Secondary | ICD-10-CM | POA: Insufficient documentation

## 2018-03-18 DIAGNOSIS — F17210 Nicotine dependence, cigarettes, uncomplicated: Secondary | ICD-10-CM | POA: Insufficient documentation

## 2018-03-18 HISTORY — DX: Diaphragmatic hernia without obstruction or gangrene: K44.9

## 2018-03-18 HISTORY — DX: Personal history of malignant neoplasm of bladder: Z85.51

## 2018-03-18 HISTORY — DX: Urge incontinence: N39.41

## 2018-03-18 HISTORY — DX: Restless legs syndrome: G25.81

## 2018-03-18 HISTORY — DX: Diverticulosis of large intestine without perforation or abscess without bleeding: K57.30

## 2018-03-18 HISTORY — DX: Renal agenesis, unilateral: Q60.0

## 2018-03-18 HISTORY — DX: Personal history of other diseases of the digestive system: Z87.19

## 2018-03-18 HISTORY — DX: Personal history of other malignant neoplasm of kidney: Z85.528

## 2018-03-18 HISTORY — DX: Duodenal ulcer, unspecified as acute or chronic, without hemorrhage or perforation: K26.9

## 2018-03-18 HISTORY — DX: Malignant neoplasm of bladder, unspecified: C67.9

## 2018-03-18 HISTORY — PX: TRANSURETHRAL RESECTION OF BLADDER TUMOR: SHX2575

## 2018-03-18 HISTORY — DX: Gastro-esophageal reflux disease without esophagitis: K21.9

## 2018-03-18 HISTORY — DX: Anemia, unspecified: D64.9

## 2018-03-18 HISTORY — DX: Age-related osteoporosis without current pathological fracture: M81.0

## 2018-03-18 HISTORY — DX: Noninfective gastroenteritis and colitis, unspecified: K52.9

## 2018-03-18 HISTORY — DX: Unspecified chronic gastritis without bleeding: K29.50

## 2018-03-18 HISTORY — DX: Generalized anxiety disorder: F41.1

## 2018-03-18 HISTORY — DX: Unspecified osteoarthritis, unspecified site: M19.90

## 2018-03-18 LAB — POCT I-STAT 4, (NA,K, GLUC, HGB,HCT)
Glucose, Bld: 94 mg/dL (ref 70–99)
HCT: 27 % — ABNORMAL LOW (ref 36.0–46.0)
Hemoglobin: 9.2 g/dL — ABNORMAL LOW (ref 12.0–15.0)
POTASSIUM: 3.1 mmol/L — AB (ref 3.5–5.1)
SODIUM: 140 mmol/L (ref 135–145)

## 2018-03-18 SURGERY — TURBT (TRANSURETHRAL RESECTION OF BLADDER TUMOR)
Anesthesia: General | Site: Bladder

## 2018-03-18 MED ORDER — SODIUM CHLORIDE 0.9 % IV SOLN
INTRAVENOUS | Status: DC
  Administered 2018-03-18: 12:00:00 via INTRAVENOUS
  Filled 2018-03-18: qty 1000

## 2018-03-18 MED ORDER — LIDOCAINE 2% (20 MG/ML) 5 ML SYRINGE
INTRAMUSCULAR | Status: AC
  Filled 2018-03-18: qty 5

## 2018-03-18 MED ORDER — FENTANYL CITRATE (PF) 100 MCG/2ML IJ SOLN
INTRAMUSCULAR | Status: DC | PRN
  Administered 2018-03-18 (×4): 25 ug via INTRAVENOUS

## 2018-03-18 MED ORDER — CIPROFLOXACIN HCL 500 MG PO TABS: 500.0000 mg | ORAL_TABLET | Freq: Two times a day (BID) | ORAL | 0 refills | Status: AC

## 2018-03-18 MED ORDER — PHENYLEPHRINE 40 MCG/ML (10ML) SYRINGE FOR IV PUSH (FOR BLOOD PRESSURE SUPPORT)
PREFILLED_SYRINGE | INTRAVENOUS | Status: AC
  Filled 2018-03-18: qty 20

## 2018-03-18 MED ORDER — IOHEXOL 300 MG/ML  SOLN
INTRAMUSCULAR | Status: DC | PRN
  Administered 2018-03-18: 25 mL

## 2018-03-18 MED ORDER — ONDANSETRON HCL 4 MG/2ML IJ SOLN
INTRAMUSCULAR | Status: DC | PRN
  Administered 2018-03-18: 4 mg via INTRAVENOUS

## 2018-03-18 MED ORDER — TRAMADOL HCL 50 MG PO TABS
ORAL_TABLET | ORAL | Status: AC
  Filled 2018-03-18: qty 1

## 2018-03-18 MED ORDER — ONDANSETRON HCL 4 MG PO TABS: 4.0000 mg | ORAL_TABLET | Freq: Every day | ORAL | 1 refills | Status: DC | PRN

## 2018-03-18 MED ORDER — PHENAZOPYRIDINE HCL 200 MG PO TABS
200.0000 mg | ORAL_TABLET | Freq: Once | ORAL | Status: AC
  Administered 2018-03-18: 200 mg via ORAL
  Filled 2018-03-18: qty 1

## 2018-03-18 MED ORDER — ACETAMINOPHEN 10 MG/ML IV SOLN
1000.0000 mg | Freq: Once | INTRAVENOUS | Status: DC | PRN
Start: 2018-03-18 — End: 2018-03-18
  Filled 2018-03-18: qty 100

## 2018-03-18 MED ORDER — PHENYLEPHRINE 40 MCG/ML (10ML) SYRINGE FOR IV PUSH (FOR BLOOD PRESSURE SUPPORT)
PREFILLED_SYRINGE | INTRAVENOUS | Status: DC | PRN
  Administered 2018-03-18 (×2): 80 ug via INTRAVENOUS
  Administered 2018-03-18 (×2): 120 ug via INTRAVENOUS
  Administered 2018-03-18 (×2): 80 ug via INTRAVENOUS
  Administered 2018-03-18: 40 ug via INTRAVENOUS
  Administered 2018-03-18: 80 ug via INTRAVENOUS

## 2018-03-18 MED ORDER — CIPROFLOXACIN IN D5W 400 MG/200ML IV SOLN
400.0000 mg | Freq: Once | INTRAVENOUS | Status: AC
  Administered 2018-03-18: 400 mg via INTRAVENOUS
  Filled 2018-03-18: qty 200

## 2018-03-18 MED ORDER — PHENAZOPYRIDINE HCL 100 MG PO TABS
ORAL_TABLET | ORAL | Status: AC
  Filled 2018-03-18: qty 2

## 2018-03-18 MED ORDER — GEMCITABINE CHEMO FOR BLADDER INSTILLATION 2000 MG
INTRAVENOUS | Status: DC | PRN
  Administered 2018-03-18: 2000 mg via INTRAVESICAL

## 2018-03-18 MED ORDER — PROPOFOL 10 MG/ML IV BOLUS
INTRAVENOUS | Status: AC
  Filled 2018-03-18: qty 20

## 2018-03-18 MED ORDER — FENTANYL CITRATE (PF) 100 MCG/2ML IJ SOLN
INTRAMUSCULAR | Status: AC
  Filled 2018-03-18: qty 2

## 2018-03-18 MED ORDER — PROMETHAZINE HCL 25 MG/ML IJ SOLN
6.2500 mg | INTRAMUSCULAR | Status: DC | PRN
  Filled 2018-03-18: qty 1

## 2018-03-18 MED ORDER — TRAMADOL HCL 50 MG PO TABS
50.0000 mg | ORAL_TABLET | Freq: Four times a day (QID) | ORAL | Status: DC | PRN
  Administered 2018-03-18: 50 mg via ORAL
  Filled 2018-03-18: qty 1

## 2018-03-18 MED ORDER — PROPOFOL 10 MG/ML IV BOLUS
INTRAVENOUS | Status: DC | PRN
  Administered 2018-03-18: 200 mg via INTRAVENOUS

## 2018-03-18 MED ORDER — FENTANYL CITRATE (PF) 100 MCG/2ML IJ SOLN
25.0000 ug | INTRAMUSCULAR | Status: DC | PRN
Start: 2018-03-18 — End: 2018-03-18
  Administered 2018-03-18 (×2): 25 ug via INTRAVENOUS
  Filled 2018-03-18: qty 1

## 2018-03-18 MED ORDER — EPHEDRINE SULFATE-NACL 50-0.9 MG/10ML-% IV SOSY
PREFILLED_SYRINGE | INTRAVENOUS | Status: DC | PRN
  Administered 2018-03-18 (×3): 5 mg via INTRAVENOUS

## 2018-03-18 MED ORDER — LIDOCAINE 2% (20 MG/ML) 5 ML SYRINGE
INTRAMUSCULAR | Status: DC | PRN
  Administered 2018-03-18: 40 mg via INTRAVENOUS

## 2018-03-18 MED ORDER — OXYBUTYNIN CHLORIDE 5 MG PO TABS
5.0000 mg | ORAL_TABLET | Freq: Once | ORAL | Status: AC
  Administered 2018-03-18: 5 mg via ORAL
  Filled 2018-03-18: qty 1

## 2018-03-18 MED ORDER — EPHEDRINE 5 MG/ML INJ
INTRAVENOUS | Status: AC
  Filled 2018-03-18: qty 20

## 2018-03-18 MED ORDER — PHENAZOPYRIDINE HCL 200 MG PO TABS: 200.0000 mg | ORAL_TABLET | Freq: Three times a day (TID) | ORAL | 0 refills | Status: DC | PRN

## 2018-03-18 MED ORDER — OXYCODONE HCL 5 MG/5ML PO SOLN
5.0000 mg | Freq: Once | ORAL | Status: DC | PRN
  Filled 2018-03-18: qty 5

## 2018-03-18 MED ORDER — GEMCITABINE CHEMO FOR BLADDER INSTILLATION 2000 MG
2000.0000 mg | Freq: Once | INTRAVENOUS | Status: DC
  Filled 2018-03-18: qty 52.6

## 2018-03-18 MED ORDER — OXYCODONE HCL 5 MG PO TABS
5.0000 mg | ORAL_TABLET | Freq: Once | ORAL | Status: DC | PRN
  Filled 2018-03-18: qty 1

## 2018-03-18 MED ORDER — OXYBUTYNIN CHLORIDE 5 MG PO TABS: 5.0000 mg | ORAL_TABLET | Freq: Three times a day (TID) | ORAL | 1 refills | Status: DC | PRN

## 2018-03-18 MED ORDER — TRAMADOL HCL 50 MG PO TABS: 50.0000 mg | ORAL_TABLET | Freq: Four times a day (QID) | ORAL | 0 refills | Status: AC | PRN

## 2018-03-18 MED ORDER — SODIUM CHLORIDE 0.9 % IR SOLN
Status: DC | PRN
  Administered 2018-03-18: 9000 mL

## 2018-03-18 MED ORDER — OXYBUTYNIN CHLORIDE 5 MG PO TABS
ORAL_TABLET | ORAL | Status: AC
  Filled 2018-03-18: qty 1

## 2018-03-18 MED ORDER — CIPROFLOXACIN IN D5W 400 MG/200ML IV SOLN
INTRAVENOUS | Status: AC
Start: 2018-03-18 — End: ?
  Filled 2018-03-18: qty 200

## 2018-03-18 SURGICAL SUPPLY — 21 items
BAG DRAIN URO-CYSTO SKYTR STRL (DRAIN) ×3 IMPLANT
BAG URINE DRAINAGE (UROLOGICAL SUPPLIES) IMPLANT
BAG URINE LEG 500ML (DRAIN) IMPLANT
CATH FOLEY 2WAY SLVR  5CC 16FR (CATHETERS) ×2
CATH FOLEY 2WAY SLVR 5CC 16FR (CATHETERS) ×1 IMPLANT
GLOVE BIO SURGEON STRL SZ7.5 (GLOVE) ×3 IMPLANT
GOWN STRL REUS W/TWL XL LVL3 (GOWN DISPOSABLE) ×3 IMPLANT
HOLDER FOLEY CATH W/STRAP (MISCELLANEOUS) IMPLANT
IV NS IRRIG 3000ML ARTHROMATIC (IV SOLUTION) IMPLANT
LOOP CUT BIPOLAR 24F LRG (ELECTROSURGICAL) IMPLANT
MANIFOLD NEPTUNE II (INSTRUMENTS) ×3 IMPLANT
NS IRRIG 500ML POUR BTL (IV SOLUTION) IMPLANT
PACK CYSTO (CUSTOM PROCEDURE TRAY) ×3 IMPLANT
PLUG CATH AND CAP STER (CATHETERS) ×3 IMPLANT
SYR 10ML LL (SYRINGE) ×3 IMPLANT
SYRINGE IRR TOOMEY STRL 70CC (SYRINGE) IMPLANT
TUBE CONNECTING 12'X1/4 (SUCTIONS) ×1
TUBE CONNECTING 12X1/4 (SUCTIONS) ×2 IMPLANT
TUBING UROLOGY SET (TUBING) IMPLANT
WATER STERILE IRR 3000ML UROMA (IV SOLUTION) IMPLANT
WATER STERILE IRR 500ML POUR (IV SOLUTION) IMPLANT

## 2018-03-18 NOTE — Anesthesia Preprocedure Evaluation (Signed)
Anesthesia Evaluation  Patient identified by MRN, date of birth, ID band Patient awake    Reviewed: Allergy & Precautions, NPO status , Patient's Chart, lab work & pertinent test results  Airway Mallampati: II  TM Distance: >3 FB Neck ROM: Full    Dental no notable dental hx.    Pulmonary COPD, Current Smoker,    Pulmonary exam normal breath sounds clear to auscultation       Cardiovascular negative cardio ROS Normal cardiovascular exam Rhythm:Regular Rate:Normal     Neuro/Psych negative neurological ROS  negative psych ROS   GI/Hepatic Neg liver ROS, PUD,   Endo/Other  negative endocrine ROS  Renal/GU negative Renal ROS  negative genitourinary   Musculoskeletal negative musculoskeletal ROS (+)   Abdominal   Peds negative pediatric ROS (+)  Hematology negative hematology ROS (+)   Anesthesia Other Findings   Reproductive/Obstetrics negative OB ROS                             Anesthesia Physical Anesthesia Plan  ASA: III  Anesthesia Plan: General   Post-op Pain Management:    Induction: Intravenous  PONV Risk Score and Plan: 2 and Ondansetron, Dexamethasone and Treatment may vary due to age or medical condition  Airway Management Planned: LMA  Additional Equipment:   Intra-op Plan:   Post-operative Plan: Extubation in OR  Informed Consent: I have reviewed the patients History and Physical, chart, labs and discussed the procedure including the risks, benefits and alternatives for the proposed anesthesia with the patient or authorized representative who has indicated his/her understanding and acceptance.   Dental advisory given  Plan Discussed with: CRNA and Surgeon  Anesthesia Plan Comments:         Anesthesia Quick Evaluation

## 2018-03-18 NOTE — Anesthesia Procedure Notes (Signed)
Procedure Name: LMA Insertion Date/Time: 03/18/2018 12:47 PM Performed by: Gwyndolyn Saxon, CRNA Pre-anesthesia Checklist: Patient identified, Emergency Drugs available, Suction available, Patient being monitored and Timeout performed Patient Re-evaluated:Patient Re-evaluated prior to induction Oxygen Delivery Method: Circle system utilized Preoxygenation: Pre-oxygenation with 100% oxygen Induction Type: IV induction Ventilation: Mask ventilation without difficulty LMA: LMA inserted LMA Size: 3.0 Number of attempts: 1 Placement Confirmation: positive ETCO2,  CO2 detector and breath sounds checked- equal and bilateral Tube secured with: Tape Dental Injury: Teeth and Oropharynx as per pre-operative assessment

## 2018-03-18 NOTE — Progress Notes (Signed)
Patient ,family stated unable to afford Rx -Dr Lovena Neighbours made aware-discussed some OTC options- several pharmacy's called to compare costs.Plan to use [facility] to fill her Rx's.

## 2018-03-18 NOTE — Op Note (Signed)
Operative Note  Preoperative diagnosis:  1.  3 cm bladder dome tumor with multiple 0.5 cm papillary tumors involving the left bladder wall 2.  History of urothelial carcinoma  Postoperative diagnosis: 1.  3 cm bladder dome tumor with multiple 0.5 cm papillary tumors involving the left bladder wall 2.  History of urothelial carcinoma  Procedure(s): 1.  TURBT of 3 cm bladder dome tumor and 0.5 cm papillary tumors involving the left bladder wall 2.  Cystogram with intraoperative interpretation of fluoroscopic imaging 3.  Intravesical instillation of 2000 mg of gemcitabine  Surgeon: Ellison Hughs, MD  Assistants: None  Anesthesia: General  Complications: None  EBL: Less than 5 mL  Specimens: 1.  Left bladder wall tumors 2.  Bladder dome tumor  Drains/Catheters: 1.  16 French Foley catheter was plugged at 1:17 PM after gemcitabine instillation.  The catheter will be placed to drainage at 2:02 PM  Intraoperative findings:   1. 3 cm bladder dome tumor with calcifications 2. A total of three 0.5 cm papillary tumors involving the left bladder wall 3. No evidence of bladder perforation on cystogram  Indication:  Alzina Golda is a 77 y.o. 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 Philbert Riser.   She recently had a cystoscopy that revealed multiple papillary recurrences involving the left bladder wall as well as a 3 cm tumor involving the bladder dome.  She has been consented for the above procedures, voices understanding wishes to proceed.  Description of procedure:  After informed consent was obtained, the patient was brought to the operating room and general LMA anesthesia was administered. The patient was then placed in the dorsolithotomy position and prepped and draped in usual sterile fashion. A timeout was performed.  A 23 French rigid cystoscope was then inserted into the urethral meatus and advanced into the bladder under direct vision. A complete bladder survey revealed the after mentioned bladder tumors with no additional pathology.  The rigid cystoscope was then exchanged for a 26 French resectoscope with a bipolar loop working element.  The papillary tumors involving the left bladder wall were then resected and sent to pathology as a separate specimen.  The area of resection was then fulgurated until hemostasis was achieved.  The bladder dome tumor was then carefully resected down to the detrusor musculature, assuring not to perforate the bladder.  The bladder dome tumor was sent as a separate specimen.  The area of resection along the bladder dome was then extensively fulgurated until hemostasis was achieved.  The resectoscope was then removed and a 16 French Foley catheter was inserted.  A cystogram was obtained that revealed no evidence of extraperitoneal or intraperitoneal bladder perforation with a smooth contour of the bladder seen on fluoroscopy.  The bladder was then completely drained.  A 52 mL solution of 2000 mg of gemcitabine was then instilled within the bladder at 1:17 PM.  The catheter was then clamped at that time.  The patient was then awoken from anesthesia having tolerated the procedure well.  She was transferred to the postanesthesia in stable condition.  Plan: Drain the bladder at 2:02 PM.  Patient will follow-up in 2 weeks to discuss her pathology results

## 2018-03-18 NOTE — Discharge Instructions (Signed)
Transurethral Resection of Bladder Tumor, Care After Refer to this sheet in the next few weeks. These instructions provide you with information about caring for yourself after your procedure. Your health care provider may also give you more specific instructions. Your treatment has been planned according to current medical practices, but problems sometimes occur. Call your health care provider if you have any problems or questions after your procedure. What can I expect after the procedure? After the procedure, it is common to have:  A small amount of blood in your urine for up to 2 weeks.  Soreness or mild discomfort from your catheter. After your catheter is removed, you may have mild soreness, especially when urinating.  Pain in your lower abdomen.  Follow these instructions at home:  Medicines  Take over-the-counter and prescription medicines only as told by your health care provider.  Do not drive or operate heavy machinery while taking prescription pain medicine.  Do not drive for 24 hours if you received a sedative.  If you were prescribed antibiotic medicine, take it as told by your health care provider. Do not stop taking the antibiotic even if you start to feel better. Activity  Return to your normal activities as told by your health care provider. Ask your health care provider what activities are safe for you.  Do not lift anything that is heavier than 10 lb (4.5 kg) for as long as told by your health care provider.  Avoid intense physical activity for as long as told by your health care provider.  Walk at least one time every day. This helps to prevent blood clots. You may increase your physical activity gradually as you start to feel better. General instructions  Do not drink alcohol for as long as told by your health care provider. This is especially important if you are taking prescription pain medicines.  Do not take baths, swim, or use a hot tub until your health  care provider approves.  If you have a catheter, follow instructions from your health care provider about caring for your catheter and your drainage bag.  Drink enough fluid to keep your urine clear or pale yellow.  Wear compression stockings as told by your health care provider. These stockings help to prevent blood clots and reduce swelling in your legs.  Keep all follow-up visits as told by your health care provider. This is important. Contact a health care provider if:  You have pain that gets worse or does not improve with medicine.  You have blood in your urine for more than 2 weeks.  You have cloudy or bad-smelling urine.  You become constipated. Signs of constipation may include having: ? Fewer than three bowel movements in a week. ? Difficulty having a bowel movement. ? Stools that are dry, hard, or larger than normal.  You have a fever. Get help right away if:  You have: ? Severe pain. ? Bright-red blood in your urine. ? Blood clots in your urine. ? A lot of blood in your urine.  Your catheter has been removed and you are not able to urinate.  You have a catheter in place and the catheter is not draining urine. This information is not intended to replace advice given to you by your health care provider. Make sure you discuss any questions you have with your health care provider. Document Released: 07/17/2015 Document Revised: 04/07/2016 Document Reviewed: 04/27/2015 Elsevier Interactive Patient Education  2018 [facility] Instructions  Activity:  Get plenty of rest for the remainder of the day. A responsible individual must stay with you for 24 hours following the procedure.  For the next 24 hours, DO NOT: -Drive a car -Paediatric nurse -Drink alcoholic beverages -Take any medication unless instructed by your physician -Make any legal decisions or sign important papers.  Meals: Start with liquid foods such as gelatin or soup.  Progress to regular foods as tolerated. Avoid greasy, spicy, heavy foods. If nausea and/or vomiting occur, drink only clear liquids until the nausea and/or vomiting subsides. Call your physician if vomiting continues.  Special Instructions/Symptoms: Your throat may feel dry or sore from the anesthesia or the breathing tube placed in your throat during surgery. If this causes discomfort, gargle with warm salt water. The discomfort should disappear within 24 hours.

## 2018-03-18 NOTE — Transfer of Care (Signed)
Immediate Anesthesia Transfer of Care Note  Patient: Haeleigh Streiff  Procedure(s) Performed: TRANSURETHRAL RESECTION OF BLADDER TUMOR (TURBT), WITH CYSTOSCOPY, CYSTOGRAM, INSTILATION OF GEMCITABINE (N/A Bladder)  Patient Location: PACU  Anesthesia Type:General  Level of Consciousness: drowsy  Airway & Oxygen Therapy: Patient Spontanous Breathing and Patient connected to face mask oxygen  Post-op Assessment: Report given to RN and Post -op Vital signs reviewed and stable  Post vital signs: Reviewed and stable  Last Vitals:  Vitals Value Taken Time  BP 131/65 03/18/2018  1:30 PM  Temp    Pulse 61 03/18/2018  1:29 PM  Resp 11 03/18/2018  1:31 PM  SpO2 78 % 03/18/2018  1:29 PM  Vitals shown include unvalidated device data.  Last Pain:  Vitals:   03/18/18 1112  TempSrc:   PainSc: 0-No pain      Patients Stated Pain Goal: 5 (82/06/01 5615)  Complications: No apparent anesthesia complications

## 2018-03-20 ENCOUNTER — Encounter (HOSPITAL_BASED_OUTPATIENT_CLINIC_OR_DEPARTMENT_OTHER): Payer: Self-pay | Admitting: Urology

## 2018-03-20 NOTE — Anesthesia Postprocedure Evaluation (Signed)
Anesthesia Post Note  Patient: [facility]  Procedure(s) Performed: TRANSURETHRAL RESECTION OF BLADDER TUMOR (TURBT), WITH CYSTOSCOPY, CYSTOGRAM, INSTILATION OF GEMCITABINE (N/A Bladder)     Patient location during evaluation: PACU Anesthesia Type: General Level of consciousness: awake and alert Pain management: pain level controlled Vital Signs Assessment: post-procedure vital signs reviewed and stable Respiratory status: spontaneous breathing, nonlabored ventilation, respiratory function stable and patient connected to nasal cannula oxygen Cardiovascular status: blood pressure returned to baseline and stable Postop Assessment: no apparent nausea or vomiting Anesthetic complications: no    Last Vitals:  Vitals:   03/18/18 1445 03/18/18 1611  BP: 134/64 (!) 159/82  Pulse: 80 76  Resp: 15   Temp:  36.6 C  SpO2: 94% 95%    Last Pain:  Vitals:   03/19/18 1251  TempSrc:   PainSc: 0-No pain                 Osmond Steckman S

## 2018-04-22 ENCOUNTER — Inpatient Hospital Stay (HOSPITAL_COMMUNITY)
Admission: EM | Admit: 2018-04-22 | Discharge: 2018-04-25 | DRG: 378 | Disposition: A | Discharge status: Home Health | Payer: Medicare Other | Attending: Family Medicine | Admitting: Family Medicine

## 2018-04-22 ENCOUNTER — Encounter (HOSPITAL_COMMUNITY): Payer: Self-pay

## 2018-04-22 DIAGNOSIS — Z9981 Dependence on supplemental oxygen: Secondary | ICD-10-CM

## 2018-04-22 DIAGNOSIS — G43909 Migraine, unspecified, not intractable, without status migrainosus: Secondary | ICD-10-CM | POA: Diagnosis present

## 2018-04-22 DIAGNOSIS — D500 Iron deficiency anemia secondary to blood loss (chronic): Secondary | ICD-10-CM | POA: Diagnosis present

## 2018-04-22 DIAGNOSIS — M810 Age-related osteoporosis without current pathological fracture: Secondary | ICD-10-CM | POA: Diagnosis present

## 2018-04-22 DIAGNOSIS — F17210 Nicotine dependence, cigarettes, uncomplicated: Secondary | ICD-10-CM | POA: Diagnosis present

## 2018-04-22 DIAGNOSIS — R51 Headache: Secondary | ICD-10-CM

## 2018-04-22 DIAGNOSIS — F419 Anxiety disorder, unspecified: Secondary | ICD-10-CM | POA: Diagnosis present

## 2018-04-22 DIAGNOSIS — E785 Hyperlipidemia, unspecified: Secondary | ICD-10-CM | POA: Diagnosis present

## 2018-04-22 DIAGNOSIS — Z905 Acquired absence of kidney: Secondary | ICD-10-CM

## 2018-04-22 DIAGNOSIS — R519 Headache, unspecified: Secondary | ICD-10-CM

## 2018-04-22 DIAGNOSIS — Z8711 Personal history of peptic ulcer disease: Secondary | ICD-10-CM

## 2018-04-22 DIAGNOSIS — Z7951 Long term (current) use of inhaled steroids: Secondary | ICD-10-CM

## 2018-04-22 DIAGNOSIS — J441 Chronic obstructive pulmonary disease with (acute) exacerbation: Secondary | ICD-10-CM | POA: Diagnosis present

## 2018-04-22 DIAGNOSIS — Z8551 Personal history of malignant neoplasm of bladder: Secondary | ICD-10-CM

## 2018-04-22 DIAGNOSIS — J449 Chronic obstructive pulmonary disease, unspecified: Secondary | ICD-10-CM

## 2018-04-22 DIAGNOSIS — K922 Gastrointestinal hemorrhage, unspecified: Secondary | ICD-10-CM | POA: Diagnosis not present

## 2018-04-22 DIAGNOSIS — Z79899 Other long term (current) drug therapy: Secondary | ICD-10-CM

## 2018-04-22 DIAGNOSIS — Z720 Tobacco use: Secondary | ICD-10-CM

## 2018-04-22 DIAGNOSIS — Z85528 Personal history of other malignant neoplasm of kidney: Secondary | ICD-10-CM

## 2018-04-22 DIAGNOSIS — Z8719 Personal history of other diseases of the digestive system: Secondary | ICD-10-CM

## 2018-04-22 DIAGNOSIS — E876 Hypokalemia: Secondary | ICD-10-CM | POA: Diagnosis present

## 2018-04-22 DIAGNOSIS — D62 Acute posthemorrhagic anemia: Secondary | ICD-10-CM

## 2018-04-22 DIAGNOSIS — G2581 Restless legs syndrome: Secondary | ICD-10-CM | POA: Diagnosis present

## 2018-04-22 DIAGNOSIS — D649 Anemia, unspecified: Secondary | ICD-10-CM

## 2018-04-22 DIAGNOSIS — K210 Gastro-esophageal reflux disease with esophagitis: Secondary | ICD-10-CM | POA: Diagnosis present

## 2018-04-22 LAB — CBC
HEMATOCRIT: 24.8 % — AB (ref 36.0–46.0)
Hemoglobin: 6.9 g/dL — CL (ref 12.0–15.0)
MCH: 19.8 pg — ABNORMAL LOW (ref 26.0–34.0)
MCHC: 27.8 g/dL — ABNORMAL LOW (ref 30.0–36.0)
MCV: 71.3 fL — AB (ref 78.0–100.0)
Platelets: 307 10*3/uL (ref 150–400)
RBC: 3.48 MIL/uL — AB (ref 3.87–5.11)
RDW: 19.2 % — ABNORMAL HIGH (ref 11.5–15.5)
WBC: 6.5 10*3/uL (ref 4.0–10.5)

## 2018-04-22 LAB — SAMPLE TO BLOOD BANK

## 2018-04-22 MED ORDER — SODIUM CHLORIDE 0.9 % IV SOLN
10.0000 mL/h | Freq: Once | INTRAVENOUS | Status: AC
  Administered 2018-04-23: 10 mL/h via INTRAVENOUS

## 2018-04-22 NOTE — ED Provider Notes (Signed)
[facility] DEPT Provider Note   CSN: [id number] Arrival date & time: 04/22/18  2100    History   Chief Complaint Chief Complaint  Patient presents with  . low hemoglobin    HPI [facility] is a 77 y.o. female.   77 year old female with a history of dyslipidemia, COPD, UGI bleed (07/2018, 09/2017) 2/2 multiple ulcers, bladder cancer s/p TURBT x 2, renal cell CA s/p L nephrectomy's to the emergency department at the advice of her primary doctor for anemia.  States that she had labs drawn at her routine six-month follow-up visit today.  Was told that her hemoglobin was low.  She denies any chest pain or new/worsening shortness of breath.  No increased bleeding or bruising.  Denies noting any melena, hematochezia, hematuria.  No recent hematemesis, fevers, or abdominal pain.     Past Medical History:  Diagnosis Date  . Anemia   . Arthritis    hands  . Bladder cancer ([place])   . Chronic diarrhea   . Chronic gastritis   . COPD (chronic obstructive pulmonary disease) ([place])    per pt uses neb. as needed  . Depression   . Diverticulosis of colon   . Duodenal ulcer 08/01/2017   multiple per EGD  . GAD (generalized anxiety disorder)   . GERD (gastroesophageal reflux disease)   . Hiatal hernia   . History of bladder cancer 2017   s/p  TURBT's twice   . History of colonic diverticulitis   . History of GI bleed 08/01/2017   and 10-06-2017 upper GI bleed due to multiple duonenal ulcer , duodenitis , gastritis (transfused 10-06-2017)  . History of renal cell carcinoma    dx 2016---  s/p  left nephrectomy  . Hyperlipidemia   . Osteoporosis   . RLS (restless legs syndrome)   . 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 Urge incontinence of urine     Patient Active Problem List   Diagnosis Date Noted  . Symptomatic anemia 10/07/2017  . Duodenal ulcer 10/07/2017  . Malnutrition of moderate degree 10/07/2017  . Anemia due to GI blood  loss 10/06/2017  . Protein-calorie malnutrition, severe 08/07/2017  . Duodenitis 08/03/2017  . Blood loss anemia 08/03/2017    Past Surgical History:  Procedure Laterality Date  . ABDOMINAL HYSTERECTOMY  1973   w/ BSO and Appendectomy  . COLON SURGERY  2015   in [place]   "my colon was reconstructed"  . ESOPHAGOGASTRODUODENOSCOPY N/A 08/06/2017   Procedure: ESOPHAGOGASTRODUODENOSCOPY (EGD);  Surgeon: Clarene Essex, MD;  Location: [facility];  Service: Endoscopy;  Laterality: N/A;  . ESOPHAGOGASTRODUODENOSCOPY (EGD) WITH PROPOFOL Left 10/08/2017   Procedure: ESOPHAGOGASTRODUODENOSCOPY (EGD) WITH PROPOFOL;  Surgeon: Arta Silence, MD;  Location: [facility];  Service: Endoscopy;  Laterality: Left;  . HEMICOLECTOMY  x3  last one 2010   last one w/ ostomy (diverticulitis)  . INGUINAL HERNIA REPAIR Left 1980s  . LAPAROSCOPIC CHOLECYSTECTOMY  1990s  . LEFT NEPHRECTOMY  2016   in [place]   renal cell carcinoma  . TRANSURETHRAL RESECTION OF BLADDER TUMOR  x2  2017  in [place], [place]  . TRANSURETHRAL RESECTION OF BLADDER TUMOR N/A 03/18/2018   Procedure: TRANSURETHRAL RESECTION OF BLADDER TUMOR (TURBT), WITH CYSTOSCOPY, CYSTOGRAM, INSTILATION OF GEMCITABINE;  Surgeon: Ceasar Mons, MD;  Location: [facility];  Service: Urology;  Laterality: N/A;     OB History   None      Home  Medications    Prior to Admission medications   Medication Sig Start Date End Date Taking? Authorizing Provider  ALPRAZolam Duanne Moron) 0.5 MG tablet Take 1 tablet (0.5 mg total) by mouth 3 (three) times daily as needed for anxiety. Patient taking differently: Take 0.5 mg by mouth 2 (two) times daily as needed for anxiety.  10/10/17  Yes Bonnielee Haff, MD  dicyclomine (BENTYL) 10 MG capsule Take 1 capsule (10 mg total) by mouth 3 (three) times daily as needed for spasms (bowel spasms). Patient taking differently: Take 10 mg by mouth 4 (four) times daily.  10/12/17  Yes Bonnielee Haff, MD  pantoprazole (PROTONIX) 40 MG tablet Take 1 tablet (40 mg total) by mouth 2 (two) times daily before a meal. After 1 month take 1 daily in the morning Patient taking differently: Take 40 mg by mouth every morning. After 1 month take 1 daily in the morning 10/10/17  Yes Bonnielee Haff, MD  rOPINIRole (REQUIP) 2 MG tablet Take 2 mg by mouth at bedtime.   Yes [provider]  sertraline (ZOLOFT) 100 MG tablet Take 1 tablet (100 mg total) by mouth at bedtime. 08/08/17  Yes Gherghe, Vella Redhead, MD  ipratropium-albuterol (DUONEB) 0.5-2.5 (3) MG/3ML SOLN Take 3 mLs by nebulization.    [provider]  loperamide (IMODIUM A-D) 2 MG tablet Take 2 mg by mouth 4 (four) times daily as needed for diarrhea or loose stools.    [provider]  ondansetron (ZOFRAN) 4 MG tablet Take 1 tablet (4 mg total) by mouth daily as needed for nausea or vomiting. 03/18/18 03/18/19  Ceasar Mons, MD  oxybutynin (DITROPAN) 5 MG tablet Take 1 tablet (5 mg total) by mouth every 8 (eight) hours as needed for bladder spasms. 03/18/18   Ceasar Mons, MD  phenazopyridine (PYRIDIUM) 200 MG tablet Take 1 tablet (200 mg total) by mouth 3 (three) times daily as needed (for pain with urination). 03/18/18 03/18/19  Ceasar Mons, MD  potassium chloride (K-DUR) 10 MEQ tablet Take 20 mEq by mouth every morning.     [provider]    Family History History reviewed. No pertinent family history.  Social History Social History   Tobacco Use  . Smoking status: Current Some Day Smoker    Years: 60.00    Types: Cigarettes  . Smokeless tobacco: Never Used  . Tobacco comment: 03-16-2018  per pt smokes on 2 wks off 2wks and when smokes 1/5ppd  Substance Use Topics  . Alcohol use: No    Frequency: Never  . Drug use: No     Allergies   Zosyn [piperacillin sod-tazobactam so]   Review of Systems Review of Systems Ten systems reviewed and are negative for  acute change, except as noted in the HPI.    Physical Exam Updated Vital Signs BP 138/66 (BP Location: Right Arm)   Pulse 90   Temp 98.1 F (36.7 C) (Oral)   Resp 18   SpO2 93%   Physical Exam  Constitutional: She is oriented to person, place, and time. She appears well-developed and well-nourished. No distress.  Thin and frail appearing.  Nontoxic.  HENT:  Head: Normocephalic and atraumatic.  Eyes: Conjunctivae and EOM are normal. No scleral icterus.  Neck: Normal range of motion.  Cardiovascular: Normal rate, regular rhythm and intact distal pulses.  Pulmonary/Chest: Effort normal. No stridor. No respiratory distress.  Respirations even and unlabored.  Intermittent, harsh, congested sounding cough.  Abdominal: Soft. She exhibits no distension  and no mass. There is no tenderness. There is no guarding.  Soft, nontender abdomen.  Genitourinary:  Genitourinary Comments: No gross blood or melena noted on DRE.  Exam chaperoned by Antony Haste, RN.  Dermal rectal tone.  Musculoskeletal: Normal range of motion.  Neurological: She is alert and oriented to person, place, and time.  Skin: Skin is warm and dry. No rash noted. She is not diaphoretic. No erythema. No pallor.  Psychiatric: She has a normal mood and affect. Her behavior is normal.  Nursing note and vitals reviewed.    ED Treatments / Results  Labs (all labs ordered are listed, but only abnormal results are displayed) Labs Reviewed  CBC - Abnormal; Notable for the following components:      Result Value   RBC 3.48 (*)    Hemoglobin 6.9 (*)    HCT 24.8 (*)    MCV 71.3 (*)    MCH 19.8 (*)    MCHC 27.8 (*)    RDW 19.2 (*)    All other components within normal limits  BASIC METABOLIC PANEL  POC OCCULT BLOOD, ED  SAMPLE TO BLOOD BANK  TYPE AND SCREEN  PREPARE RBC (CROSSMATCH)    EKG None  Radiology No results found.  Procedures Procedures (including critical care time)  Medications Ordered in ED Medications  0.9  %  sodium chloride infusion (has no administration in time range)     CRITICAL CARE Performed by: Antonietta Breach   Total critical care time: 35 minutes  Critical care time was exclusive of separately billable procedures and treating other patients.  Critical care was necessary to treat or prevent imminent or life-threatening deterioration.  Critical care was time spent personally by me on the following activities: development of treatment plan with patient and/or surrogate as well as nursing, discussions with consultants, evaluation of patient's response to treatment, examination of patient, obtaining history from patient or surrogate, ordering and performing treatments and interventions, ordering and review of laboratory studies, ordering and review of radiographic studies, pulse oximetry and re-evaluation of patient's condition.   Initial Impression / Assessment and Plan / ED Course  I have reviewed the triage vital signs and the nursing notes.  Pertinent labs & imaging results that were available during my care of the patient were reviewed by me and considered in my medical decision making (see chart for details).     77 year old female presents to the emergency department at the advice of her primary care doctor for worsening hemoglobin.  History of anemia requiring transfusion in the setting of upper GI bleed.  She denies any melena or hematochezia prior to arrival.  This was not appreciated on bedside DRE.  Unfortunately, Hemoccult was misplaced by nursing staff.  She has had a drop in her hemoglobin from 9.2 down to 6.9 today.  This drop has been over the past month.  She is currently hemodynamically stable.  Plan for transfusion.  Will admit to hospitalist for monitoring.   Final Clinical Impressions(s) / ED Diagnoses   Final diagnoses:  Acute blood loss anemia    ED Discharge Orders    None       Antonietta Breach, PA-C 04/23/18 0008    Lajean Saver, MD 04/23/18 1501

## 2018-04-22 NOTE — ED Notes (Signed)
Date and time results received: 04/22/18 2320 (use smartphrase ".now" to insert current time)  Test: Hbg Critical Value: 6.9  Name of Provider Notified: Stein[facility]  Orders Received? Or Actions Taken?: None

## 2018-04-22 NOTE — ED Triage Notes (Signed)
Pt states she had labs drawn today and was called and told to come here because her hemoglobin was low

## 2018-04-23 ENCOUNTER — Emergency Department (HOSPITAL_COMMUNITY): Payer: Medicare Other

## 2018-04-23 ENCOUNTER — Observation Stay (HOSPITAL_COMMUNITY): Payer: Medicare Other

## 2018-04-23 ENCOUNTER — Other Ambulatory Visit: Payer: Self-pay

## 2018-04-23 ENCOUNTER — Encounter (HOSPITAL_COMMUNITY)
Admission: EM | Disposition: A | Discharge status: Home Health | Payer: Self-pay | Source: Home / Self Care | Attending: Family Medicine

## 2018-04-23 DIAGNOSIS — J449 Chronic obstructive pulmonary disease, unspecified: Secondary | ICD-10-CM

## 2018-04-23 DIAGNOSIS — K269 Duodenal ulcer, unspecified as acute or chronic, without hemorrhage or perforation: Secondary | ICD-10-CM | POA: Diagnosis not present

## 2018-04-23 DIAGNOSIS — G8929 Other chronic pain: Secondary | ICD-10-CM

## 2018-04-23 DIAGNOSIS — D500 Iron deficiency anemia secondary to blood loss (chronic): Secondary | ICD-10-CM | POA: Diagnosis present

## 2018-04-23 DIAGNOSIS — Z79899 Other long term (current) drug therapy: Secondary | ICD-10-CM | POA: Diagnosis not present

## 2018-04-23 DIAGNOSIS — R51 Headache: Secondary | ICD-10-CM

## 2018-04-23 DIAGNOSIS — D62 Acute posthemorrhagic anemia: Secondary | ICD-10-CM

## 2018-04-23 DIAGNOSIS — F17210 Nicotine dependence, cigarettes, uncomplicated: Secondary | ICD-10-CM | POA: Diagnosis present

## 2018-04-23 DIAGNOSIS — E876 Hypokalemia: Secondary | ICD-10-CM | POA: Diagnosis present

## 2018-04-23 DIAGNOSIS — Z8711 Personal history of peptic ulcer disease: Secondary | ICD-10-CM | POA: Diagnosis not present

## 2018-04-23 DIAGNOSIS — Z8551 Personal history of malignant neoplasm of bladder: Secondary | ICD-10-CM

## 2018-04-23 DIAGNOSIS — Z905 Acquired absence of kidney: Secondary | ICD-10-CM | POA: Diagnosis not present

## 2018-04-23 DIAGNOSIS — Z7951 Long term (current) use of inhaled steroids: Secondary | ICD-10-CM | POA: Diagnosis not present

## 2018-04-23 DIAGNOSIS — K210 Gastro-esophageal reflux disease with esophagitis: Secondary | ICD-10-CM | POA: Diagnosis present

## 2018-04-23 DIAGNOSIS — E785 Hyperlipidemia, unspecified: Secondary | ICD-10-CM | POA: Diagnosis present

## 2018-04-23 DIAGNOSIS — Z85528 Personal history of other malignant neoplasm of kidney: Secondary | ICD-10-CM

## 2018-04-23 DIAGNOSIS — G2581 Restless legs syndrome: Secondary | ICD-10-CM | POA: Diagnosis present

## 2018-04-23 DIAGNOSIS — K922 Gastrointestinal hemorrhage, unspecified: Secondary | ICD-10-CM | POA: Diagnosis present

## 2018-04-23 DIAGNOSIS — Z720 Tobacco use: Secondary | ICD-10-CM | POA: Diagnosis not present

## 2018-04-23 DIAGNOSIS — J441 Chronic obstructive pulmonary disease with (acute) exacerbation: Secondary | ICD-10-CM | POA: Diagnosis present

## 2018-04-23 DIAGNOSIS — Z9981 Dependence on supplemental oxygen: Secondary | ICD-10-CM | POA: Diagnosis not present

## 2018-04-23 DIAGNOSIS — Z8719 Personal history of other diseases of the digestive system: Secondary | ICD-10-CM | POA: Diagnosis not present

## 2018-04-23 DIAGNOSIS — F419 Anxiety disorder, unspecified: Secondary | ICD-10-CM | POA: Diagnosis present

## 2018-04-23 DIAGNOSIS — G43909 Migraine, unspecified, not intractable, without status migrainosus: Secondary | ICD-10-CM | POA: Diagnosis present

## 2018-04-23 DIAGNOSIS — M810 Age-related osteoporosis without current pathological fracture: Secondary | ICD-10-CM | POA: Diagnosis present

## 2018-04-23 DIAGNOSIS — K257 Chronic gastric ulcer without hemorrhage or perforation: Secondary | ICD-10-CM | POA: Diagnosis not present

## 2018-04-23 DIAGNOSIS — D649 Anemia, unspecified: Secondary | ICD-10-CM | POA: Diagnosis present

## 2018-04-23 LAB — CBC
HCT: 30.5 % — ABNORMAL LOW (ref 36.0–46.0)
Hemoglobin: 8.9 g/dL — ABNORMAL LOW (ref 12.0–15.0)
MCH: 22.3 pg — AB (ref 26.0–34.0)
MCHC: 29.2 g/dL — AB (ref 30.0–36.0)
MCV: 76.3 fL — AB (ref 78.0–100.0)
PLATELETS: 260 10*3/uL (ref 150–400)
RBC: 4 MIL/uL (ref 3.87–5.11)
RDW: 21 % — ABNORMAL HIGH (ref 11.5–15.5)
WBC: 5.8 10*3/uL (ref 4.0–10.5)

## 2018-04-23 LAB — BASIC METABOLIC PANEL
ANION GAP: 7 (ref 5–15)
Anion gap: 9 (ref 5–15)
BUN: 14 mg/dL (ref 8–23)
BUN: 10 mg/dL (ref 8–23)
CHLORIDE: 109 mmol/L (ref 98–111)
CHLORIDE: 109 mmol/L (ref 98–111)
CO2: 25 mmol/L (ref 22–32)
CO2: 26 mmol/L (ref 22–32)
CREATININE: 0.9 mg/dL (ref 0.44–1.00)
Calcium: 8.4 mg/dL — ABNORMAL LOW (ref 8.9–10.3)
Calcium: 8.4 mg/dL — ABNORMAL LOW (ref 8.9–10.3)
Creatinine, Ser: 0.69 mg/dL (ref 0.44–1.00)
GFR calc Af Amer: >60 mL/min (ref >60)
GFR calc Af Amer: >60 mL/min (ref >60)
GFR calc non Af Amer: >60 mL/min (ref >60)
Glucose, Bld: 85 mg/dL (ref 70–99)
Glucose, Bld: 94 mg/dL (ref 70–99)
POTASSIUM: 3.3 mmol/L — AB (ref 3.5–5.1)
Potassium: 2.5 mmol/L — CL (ref 3.5–5.1)
SODIUM: 142 mmol/L (ref 135–145)
Sodium: 143 mmol/L (ref 135–145)

## 2018-04-23 LAB — RETICULOCYTES
RBC.: 3.21 MIL/uL — ABNORMAL LOW (ref 3.87–5.11)
RETIC COUNT ABSOLUTE: 48.2 10*3/uL (ref 19.0–186.0)
Retic Ct Pct: 1.5 % (ref 0.4–3.1)

## 2018-04-23 LAB — IRON AND TIBC
IRON: 9 ug/dL — AB (ref 28–170)
SATURATION RATIOS: 2 % — AB (ref 10.4–31.8)
TIBC: 439 ug/dL (ref 250–450)
UIBC: 430 ug/dL

## 2018-04-23 LAB — PREPARE RBC (CROSSMATCH)

## 2018-04-23 LAB — FERRITIN: FERRITIN: 8 ng/mL — AB (ref 11–307)

## 2018-04-23 LAB — VITAMIN B12: VITAMIN B 12: 158 pg/mL — AB (ref 180–914)

## 2018-04-23 LAB — POC OCCULT BLOOD, ED: Fecal Occult Bld: POSITIVE — AB

## 2018-04-23 LAB — FOLATE: FOLATE: 8.8 ng/mL (ref >5.9)

## 2018-04-23 SURGERY — ESOPHAGOGASTRODUODENOSCOPY (EGD) WITH PROPOFOL
Anesthesia: Monitor Anesthesia Care

## 2018-04-23 MED ORDER — ONDANSETRON HCL 4 MG/2ML IJ SOLN
4.0000 mg | Freq: Four times a day (QID) | INTRAMUSCULAR | Status: DC | PRN
  Administered 2018-04-23: 4 mg via INTRAVENOUS
  Filled 2018-04-23: qty 2

## 2018-04-23 MED ORDER — DICYCLOMINE HCL 10 MG PO CAPS
10.0000 mg | ORAL_CAPSULE | Freq: Four times a day (QID) | ORAL | Status: DC
  Administered 2018-04-23 – 2018-04-25 (×10): 10 mg via ORAL
  Filled 2018-04-23 (×10): qty 1

## 2018-04-23 MED ORDER — SUCRALFATE 1 GM/10ML PO SUSP
1.0000 g | Freq: Three times a day (TID) | ORAL | Status: DC
  Administered 2018-04-23 – 2018-04-25 (×9): 1 g via ORAL
  Filled 2018-04-23 (×9): qty 10

## 2018-04-23 MED ORDER — IPRATROPIUM-ALBUTEROL 0.5-2.5 (3) MG/3ML IN SOLN: 3.0000 mL | Freq: Four times a day (QID) | RESPIRATORY_TRACT | Status: DC | PRN

## 2018-04-23 MED ORDER — BUDESONIDE 0.25 MG/2ML IN SUSP
0.2500 mg | Freq: Two times a day (BID) | RESPIRATORY_TRACT | Status: DC
  Administered 2018-04-23 – 2018-04-25 (×5): 0.25 mg via RESPIRATORY_TRACT
  Filled 2018-04-23 (×5): qty 2

## 2018-04-23 MED ORDER — COLESEVELAM HCL 625 MG PO TABS
625.0000 mg | ORAL_TABLET | Freq: Every day | ORAL | Status: DC
  Administered 2018-04-23 – 2018-04-25 (×3): 625 mg via ORAL
  Filled 2018-04-23 (×3): qty 1

## 2018-04-23 MED ORDER — ACETAMINOPHEN 650 MG RE SUPP: 650.0000 mg | Freq: Four times a day (QID) | RECTAL | Status: DC | PRN

## 2018-04-23 MED ORDER — SODIUM CHLORIDE 0.9 % IV SOLN
510.0000 mg | Freq: Once | INTRAVENOUS | Status: AC
  Administered 2018-04-23: 510 mg via INTRAVENOUS
  Filled 2018-04-23: qty 17

## 2018-04-23 MED ORDER — PANTOPRAZOLE SODIUM 40 MG PO TBEC
40.0000 mg | DELAYED_RELEASE_TABLET | Freq: Two times a day (BID) | ORAL | Status: DC
  Administered 2018-04-23 – 2018-04-25 (×5): 40 mg via ORAL
  Filled 2018-04-23 (×5): qty 1

## 2018-04-23 MED ORDER — POTASSIUM CHLORIDE CRYS ER 20 MEQ PO TBCR
40.0000 meq | EXTENDED_RELEASE_TABLET | Freq: Once | ORAL | Status: AC
  Administered 2018-04-23: 40 meq via ORAL
  Filled 2018-04-23: qty 2

## 2018-04-23 MED ORDER — IPRATROPIUM-ALBUTEROL 0.5-2.5 (3) MG/3ML IN SOLN
3.0000 mL | RESPIRATORY_TRACT | Status: DC
  Administered 2018-04-23 – 2018-04-24 (×4): 3 mL via RESPIRATORY_TRACT
  Filled 2018-04-23 (×4): qty 3

## 2018-04-23 MED ORDER — ACETAMINOPHEN 325 MG PO TABS: 650.0000 mg | ORAL_TABLET | Freq: Four times a day (QID) | ORAL | Status: DC | PRN

## 2018-04-23 MED ORDER — SERTRALINE HCL 50 MG PO TABS
100.0000 mg | ORAL_TABLET | Freq: Every day | ORAL | Status: DC
  Administered 2018-04-23 – 2018-04-24 (×3): 100 mg via ORAL
  Filled 2018-04-23 (×3): qty 2

## 2018-04-23 MED ORDER — ROPINIROLE HCL 1 MG PO TABS
2.0000 mg | ORAL_TABLET | Freq: Every day | ORAL | Status: DC
  Administered 2018-04-23 – 2018-04-24 (×3): 2 mg via ORAL
  Filled 2018-04-23 (×3): qty 2

## 2018-04-23 MED ORDER — METHYLPREDNISOLONE SODIUM SUCC 40 MG IJ SOLR
40.0000 mg | Freq: Three times a day (TID) | INTRAMUSCULAR | Status: AC
  Administered 2018-04-23 – 2018-04-24 (×3): 40 mg via INTRAVENOUS
  Filled 2018-04-23 (×3): qty 1

## 2018-04-23 MED ORDER — BOOST / RESOURCE BREEZE PO LIQD CUSTOM
1.0000 | Freq: Three times a day (TID) | ORAL | Status: DC
  Administered 2018-04-23 – 2018-04-25 (×5): 1 via ORAL

## 2018-04-23 MED ORDER — ONDANSETRON HCL 4 MG PO TABS: 4.0000 mg | ORAL_TABLET | Freq: Four times a day (QID) | ORAL | Status: DC | PRN

## 2018-04-23 MED ORDER — POTASSIUM CHLORIDE 10 MEQ/100ML IV SOLN
10.0000 meq | INTRAVENOUS | Status: AC
  Administered 2018-04-23 (×4): 10 meq via INTRAVENOUS
  Filled 2018-04-23 (×4): qty 100

## 2018-04-23 MED ORDER — TRAMADOL HCL 50 MG PO TABS
50.0000 mg | ORAL_TABLET | Freq: Two times a day (BID) | ORAL | Status: DC | PRN
  Administered 2018-04-24: 50 mg via ORAL
  Filled 2018-04-23: qty 1

## 2018-04-23 MED ORDER — ALBUTEROL SULFATE (2.5 MG/3ML) 0.083% IN NEBU
3.0000 mL | INHALATION_SOLUTION | Freq: Four times a day (QID) | RESPIRATORY_TRACT | Status: DC | PRN
  Administered 2018-04-23: 3 mL via RESPIRATORY_TRACT
  Filled 2018-04-23 (×2): qty 3

## 2018-04-23 MED ORDER — ALPRAZOLAM 0.5 MG PO TABS
0.5000 mg | ORAL_TABLET | Freq: Two times a day (BID) | ORAL | Status: DC | PRN
  Administered 2018-04-23 – 2018-04-24 (×3): 0.5 mg via ORAL
  Filled 2018-04-23 (×3): qty 1

## 2018-04-23 MED ORDER — PANTOPRAZOLE SODIUM 40 MG PO TBEC: 40.0000 mg | DELAYED_RELEASE_TABLET | Freq: Every morning | ORAL | Status: DC

## 2018-04-23 MED ORDER — ARFORMOTEROL TARTRATE 15 MCG/2ML IN NEBU
15.0000 ug | INHALATION_SOLUTION | Freq: Two times a day (BID) | RESPIRATORY_TRACT | Status: DC
  Administered 2018-04-23 – 2018-04-25 (×5): 15 ug via RESPIRATORY_TRACT
  Filled 2018-04-23 (×5): qty 2

## 2018-04-23 NOTE — Progress Notes (Addendum)
PROGRESS NOTE [facility]   Erica York   QMG:[id number] DOB: [birth year]  DOA: 04/22/2018 PCP: Heywood Bene, PA-C   Brief Narrative:  Erica York is a 77 yo woman who was sent to the ED yesterday afternoon after labs collected by her PCP demonstrated anemia. Patient PMH significant for UGI bleeds (07/2017 & 09/2017) secondary to duodenal ulcers, COPD, bladder cancer s/p TURBT in July, renal cancer s/p left nephrectomy, IBD, GERD w/ erosive esophagitis, headaches, and diverticulitis. No blood found on DRE in ED, but fecal occult blood test was positive. Labs demonstrated microcytic, iron-deficiency anemia. Transfused w/ 2 units PRBCs. CXR revealed cardiomegaly but no active pulmonary disease.    Subjective: Patient c/o fatigue, low energy, and intermittent lower abdominal pain that she attributes to her recent TURBT procedure. She also admits to daily headaches for which she takes Excedrin migraine despite knowing her risk for bleed. She also is a daily PPD smoker. Patient demonstrated SOB on exam, particularly with exertion. She states that nausea has resolved. Patient is able to ambulate w/ assistance.    Assessment & Plan:   Principal Problem:   Anemia Active Problems:   History of bladder cancer   History of renal cell cancer  Anemia, suspected GI bleed Hgb 6.9, HCT 24.8, MCV 71.3, iron 9, saturation rate 2, and ferritin of 8 all consistent w/ iron-deficiency anemia. Suspected upper GI bleed given her personal history and risk factors of smoking and Excedrin-use.  -  Feraheme transfusion -  Continue Protonix 40 mg BID -  Start on Carafate - Per conversation w/ GI consult, will not move forward with EGD - May start clear-liquid diet   Acute COPD exacerbation Patient demonstrated some SOB, particularly with exertion, on exam. Respiratory therapy was called.  -  Continue O2 - Albuterol nebulizer available PRN -  Starting solumedrol 40 mg -  Starting Brovana  and Pulmicort  Headaches Patient describes daily, disabling bilateral HA's that require her Excedrin therapy. States that Tylenol has not helped. -  Will prescribe tramadol as an alternative to Excedrin - Ordered Head CT - Will consider prophylactic Tx  Hypokalemia Potassium was low on admission but slowly being replenished as evidenced by most recent lab work. -  Giving another k-dur 40 mg    DVT prophylaxis: SCDs.  Code Status: Full-code. Family Communication: No family in room. Disposition Plan: Will continue to monitor for another 24 hours.   Consultants:   Gastroenterology [facility], [place]  Gastroenterology Dr. Loletha Carrow  Procedures:   None.  Antimicrobials:  None.   Objective: Vitals:   04/23/18 0517 04/23/18 0554 04/23/18 0555 04/23/18 0820  BP:  (!) 155/74 (!) 159/77 (!) 154/91  Pulse:  85 83 81  Resp:    20  Temp:  98.5 F (36.9 C) 98.9 F (37.2 C) 98 F (36.7 C)  TempSrc:  Oral Oral Oral  SpO2: 98% 99% 98% 99%  Weight:      Height:        Intake/Output Summary (Last 24 hours) at 04/23/2018 1130 Last data filed at 04/23/2018 0814 Gross per 24 hour  Intake 934 ml  Output -  Net 934 ml   Filed Weights   04/23/18 0154  Weight: 46.2 kg    Examination:  General exam: Appears calm and comfortable, no acute distress.  Respiratory system: Clear to auscultation b/l. No wheezes,crackle or rhonchi. Increased respiratory effort. Cardiovascular system: S1 & S2 heard, RRR. No JVD, murmurs, rubs or gallops. Gastrointestinal system: Abdomen is  nondistended, soft and nontender. No organomegaly or masses felt. Normal bowel sounds heard. Central nervous system: Alert and oriented. No focal neurological deficits. Extremities: No pedal edema. Symmetric, strength 5/5   Psychiatry: Judgement and insight appear normal. Mood & affect appropriate.    Data Reviewed: I have personally reviewed following labs and imaging studies  CBC: Recent Labs  Lab  04/22/18 2243 04/23/18 0912  WBC 6.5 5.8  HGB 6.9* 8.9*  HCT 24.8* 30.5*  MCV 71.3* 76.3*  PLT 307 338   Basic Metabolic Panel: Recent Labs  Lab 04/22/18 2243 04/23/18 0912  NA 143 142  K 2.5* 3.3*  CL 109 109  CO2 25 26  GLUCOSE 85 94  BUN 14 10  CREATININE 0.90 0.69  CALCIUM 8.4* 8.4*   GFR: Estimated Creatinine Clearance: 38.7 mL/min (by C-G formula based on SCr of 0.69 mg/dL). Liver Function Tests: No results for input(s): AST, ALT, ALKPHOS, BILITOT, PROT, ALBUMIN in the last 168 hours. No results for input(s): LIPASE, AMYLASE in the last 168 hours. No results for input(s): AMMONIA in the last 168 hours. Coagulation Profile: No results for input(s): INR, PROTIME in the last 168 hours. Cardiac Enzymes: No results for input(s): CKTOTAL, CKMB, CKMBINDEX, TROPONINI in the last 168 hours. BNP (last 3 results) No results for input(s): PROBNP in the last 8760 hours. HbA1C: No results for input(s): HGBA1C in the last 72 hours. CBG: No results for input(s): GLUCAP in the last 168 hours. Lipid Profile: No results for input(s): CHOL, HDL, LDLCALC, TRIG, CHOLHDL, LDLDIRECT in the last 72 hours. Thyroid Function Tests: No results for input(s): TSH, T4TOTAL, FREET4, T3FREE, THYROIDAB in the last 72 hours. Anemia Panel: Recent Labs    04/23/18 0041  VITAMINB12 158*  FOLATE 8.8  FERRITIN 8*  TIBC 439  IRON 9*  RETICCTPCT 1.5   Sepsis Labs: No results for input(s): PROCALCITON, LATICACIDVEN in the last 168 hours.  No results found for this or any previous visit (from the past 240 hour(s)).    Radiology Studies: Dg Chest 2 View  Result Date: 04/23/2018 CLINICAL DATA:  Anemia and weakness. EXAM: CHEST - 2 VIEW COMPARISON:  10/06/2017 FINDINGS: Mild cardiac enlargement. Pulmonary vascularity is normal. Diffuse interstitial pattern to the lungs is unchanged since previous study, likely representing chronic fibrosis. No blunting of costophrenic angles. No pneumothorax.  Mediastinal contours appear intact. Calcification of the aorta. IMPRESSION: Cardiac enlargement. 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 No evidence of active pulmonary disease. Electronically Signed   By: Lucienne Capers M.D.   On: 04/23/2018 00:54      Scheduled Meds: . arformoterol  15 mcg Nebulization BID  . budesonide (PULMICORT) nebulizer solution  0.25 mg Nebulization BID  . colesevelam  625 mg Oral Daily  . dicyclomine  10 mg Oral QID  . feeding supplement  1 Container Oral TID BM  . ipratropium-albuterol  3 mL Nebulization Q4H  . methylPREDNISolone (SOLU-MEDROL) injection  40 mg Intravenous Q8H  . pantoprazole  40 mg Oral BID  . rOPINIRole  2 mg Oral QHS  . sertraline  100 mg Oral QHS  . sucralfate  1 g Oral TID WC & HS   Continuous Infusions: . ferumoxytol       LOS: 0 days    Time spent: Total of 25 minutes spent with pt, greater than 50% of which was spent in discussion of  treatment, counseling and coordination of care    Krista Blue, PA-S Pager: Text Page via [email or web address]   If  7PM-7AM, please contact night-coverage [email or web address] 04/23/2018, 11:30 AM   Note - This record has been created using [employer]. Chart creation errors have been sought, but may not always have been located. Such creation errors do not reflect on the standard of medical care.

## 2018-04-23 NOTE — ED Notes (Signed)
ED TO INPATIENT HANDOFF REPORT  Name/Age/Gender Erica York 77 y.o. female  Code Status    Code Status Orders  (From admission, onward)         Start     Ordered   04/23/18 0017  Full code  Continuous     04/23/18 0017        Code Status History    Date Active Date Inactive Code Status Order ID Comments User Context   10/07/2017 0336 10/12/2017 1538 Full Code [id number]  Bethena Roys, MD Inpatient   08/01/2017 1349 08/08/2017 1417 Full Code [id number]  Erick Colace, NP ED      Home/SNF/Other Home  Chief Complaint Rash ( chemo card )   Level of Care/Admitting Diagnosis ED Disposition    ED Disposition Condition Comment   [place] [facility]: [facility] [[id number]]  Level of Care: Telemetry [5]  Admit to tele based on following criteria: Other see comments  Comments: Anemia  Diagnosis: Anemia [712458]  Admitting Physician: Doreatha Massed  Attending Physician: Etta Quill [[id number]]  PT Class (Do Not Modify): Observation [104]  PT Acc Code (Do Not Modify): Observation [10022]       Medical History Past Medical History:  Diagnosis Date  . Anemia   . Arthritis    hands  . Bladder cancer ([place])   . Chronic diarrhea   . Chronic gastritis   . COPD (chronic obstructive pulmonary disease) ([place])    per pt uses neb. as needed  . Depression   . Diverticulosis of colon   . Duodenal ulcer 08/01/2017   multiple per EGD  . GAD (generalized anxiety disorder)   . GERD (gastroesophageal reflux disease)   . Hiatal hernia   . History of bladder cancer 2017   s/p  TURBT's twice   . History of colonic diverticulitis   . History of GI bleed 08/01/2017   and 10-06-2017 upper GI bleed due to multiple duonenal ulcer , duodenitis , gastritis (transfused 10-06-2017)  . History of renal cell carcinoma    dx 2016---  s/p  left nephrectomy  . Hyperlipidemia   . Osteoporosis   . RLS (restless legs syndrome)   . Solitary right kidney  2016   s/p  left nephrectomy  . Urge incontinence of urine     Allergies Allergies  Allergen Reactions  . Zosyn [Piperacillin Sod-Tazobactam So] Swelling and Rash    IV Location/Drains/Wounds Patient Lines/Drains/Airways Status   Active Line/Drains/Airways    Name:   Placement date:   Placement time:   Site:   Days:   Peripheral IV 04/22/18 Right;Lateral Forearm   04/22/18    2233    Forearm   1   Peripheral IV 04/23/18 Anterior;Right Forearm   04/23/18    0042    Forearm   less than 1          Labs/Imaging Results for orders placed or performed during the hospital encounter of 04/22/18 (from the past 48 hour(s))  CBC     Status: Abnormal   Collection Time: 04/22/18 10:43 PM  Result Value Ref Range   WBC 6.5 4.0 - 10.5 K/uL   RBC 3.48 (L) 3.87 - 5.11 MIL/uL   Hemoglobin 6.9 (LL) 12.0 - 15.0 g/dL    Comment: REPEATED TO VERIFY CRITICAL RESULT CALLED TO, READ BACK BY AND VERIFIED WITHHar[place] RN 0998 04/22/18 A NAVARRO    HCT 24.8 (L) 36.0 - 46.0 %   MCV  71.3 (L) 78.0 - 100.0 fL   MCH 19.8 (L) 26.0 - 34.0 pg   MCHC 27.8 (L) 30.0 - 36.0 g/dL   RDW 19.2 (H) 11.5 - 15.5 %   Platelets 307 150 - 400 K/uL    Comment: Performed at [facility], [place] [address], [place], [place] [address]  Sample to Blood Bank     Status: None   Collection Time: 04/22/18 10:43 PM  Result Value Ref Range   Blood Bank Specimen SAMPLE AVAILABLE FOR TESTING    Sample Expiration      04/25/2018 Performed at [facility], [place] [address], [place], [place] [address]   Basic metabolic panel     Status: Abnormal   Collection Time: 04/22/18 10:43 PM  Result Value Ref Range   Sodium 143 135 - 145 mmol/L   Potassium 2.5 (LL) 3.5 - 5.1 mmol/L    Comment: NO VISIBLE HEMOLYSIS CRITICAL RESULT CALLED TO, READ BACK BY AND VERIFIED WITH: Zannie Locastro,A RN AT 0012 04/23/18 BY TIBBITTS,K    Chloride 109 98 - 111 mmol/L   CO2 25 22 - 32 mmol/L   Glucose, Bld 85 70 - 99  mg/dL   BUN 14 8 - 23 mg/dL   Creatinine, Ser 0.90 0.44 - 1.00 mg/dL   Calcium 8.4 (L) 8.9 - 10.3 mg/dL   GFR calc non Af Amer >60 >60 mL/min   GFR calc Af Amer >60 >60 mL/min    Comment: (NOTE) The eGFR has been calculated using the CKD EPI equation. This calculation has not been validated in all clinical situations. eGFR's persistently <60 mL/min signify possible Chronic Kidney Disease.    Anion gap 9 5 - 15    Comment: Performed at [facility], [place] [address] [place], [place] [address]  Type and screen     Status: None   Collection Time: 04/22/18 11:25 PM  Result Value Ref Range   ABO/RH(D) O POS    Antibody Screen POS    Sample Expiration      04/25/2018 Performed at [facility], [place] [address] [place], [place] [address]   POC occult blood, ED Provider will collect     Status: Abnormal   Collection Time: 04/23/18 12:21 AM  Result Value Ref Range   Fecal Occult Bld POSITIVE (A) NEGATIVE   No results found.  Pending Labs Unresulted Labs (From admission, onward)    Start     Ordered   04/23/18 0021  Ferritin  (Anemia Panel (PNL))  STAT,   R     04/23/18 0020   04/23/18 0021  Folate  (Anemia Panel (PNL))  STAT,   R     04/23/18 0020   04/23/18 0021  Iron and TIBC  (Anemia Panel (PNL))  STAT,   R     04/23/18 0020   04/23/18 0021  Reticulocytes  (Anemia Panel (PNL))  STAT,   R     04/23/18 0020   04/23/18 0021  Vitamin B12  (Anemia Panel (PNL))  STAT,   R     04/23/18 0020   04/22/18 2351  Prepare RBC  (Adult Blood Administration - PRBC)  Once,   R    Question Answer Comment  # of Units 2 units   Transfusion Indications Symptomatic Anemia   If emergent release call blood bank [facility] [phone]      04/22/18 2350          Vitals/Pain Today's Vitals  04/22/18 2153 04/22/18 2205 04/22/18 2300 04/23/18 0030  BP: 138/66  135/78 120/67  Pulse: 90  79 84  Resp: 18     Temp: 98.1 F (36.7 C)     TempSrc: Oral      SpO2: 93%  96% 100%  PainSc:  0-No pain      Isolation Precautions No active isolations  Medications Medications  0.9 %  sodium chloride infusion (has no administration in time range)  albuterol (PROVENTIL HFA;VENTOLIN HFA) 108 (90 Base) MCG/ACT inhaler 1-2 puff (has no administration in time range)  ALPRAZolam (XANAX) tablet 0.5 mg (has no administration in time range)  colesevelam [facility]) tablet 625 mg (has no administration in time range)  ipratropium-albuterol (DUONEB) 0.5-2.5 (3) MG/3ML nebulizer solution 3 mL (has no administration in time range)  sertraline (ZOLOFT) tablet 100 mg (has no administration in time range)  rOPINIRole (REQUIP) tablet 2 mg (has no administration in time range)  pantoprazole (PROTONIX) EC tablet 40 mg (has no administration in time range)  dicyclomine (BENTYL) capsule 10 mg (has no administration in time range)  acetaminophen (TYLENOL) tablet 650 mg (has no administration in time range)    Or  acetaminophen (TYLENOL) suppository 650 mg (has no administration in time range)  ondansetron (ZOFRAN) tablet 4 mg (has no administration in time range)    Or  ondansetron (ZOFRAN) injection 4 mg (has no administration in time range)  potassium chloride SA (K-DUR,KLOR-CON) CR tablet 40 mEq (has no administration in time range)  potassium chloride 10 mEq in 100 mL IVPB (has no administration in time range)    Mobility walks

## 2018-04-23 NOTE — Consult Note (Addendum)
Consultation  Referring Provider: Dr. Patrecia Pour     Primary Care Physician:  Heywood Bene, PA-C Primary Gastroenterologist: Althia Forts        Reason for Consultation: Anemia             HPI:   Erica York is a 77 y.o. female with a past medical history as listed below including bladder cancer status post TURBT in July, renal cancer status post left nephrectomy, COPD and previous upper GI bleed from duodenal ulcers, who presented to the ER on 04/22/2018 with a complaint of anemia.    Today, explains that she went to see her PCP yesterday for routine lab work that showed a hemoglobin of 6.5 (7.9 in June of this year).  She admits that she has been feeling some increasing weakness as well as shortness of breath and occasional positional dizziness over the past couple of weeks.  Also notes that her lower abdomen has been sore but believes this is from a recent urology procedure she had done to "cut some tumor roots out of my bladder".  Also with occasional epigastric discomfort and GERD for which she is on Protonix.  This helps with symptoms.    Describes constant diarrhea since she was "77 years old".  Apparently has been on WelChol in the past but this was too expensive after she moved to [place] and has recently been taking Imodium.  It is not abnormal for her to have 4-5 loose stools throughout the day and 2 or 3 at night.    Patient with extensive surgical history including tubal ligation, bladder surgery, hysterectomy, diverticulitis with perforation requiring colostomy, abdominal hernia requiring mesh placement, cholecystectomy, 3 separate explorative laparotomies, repositioning colostomy on 3 separate occasions, left nephrectomy- with the last surgery about 7 years ago.    Does report a 30 pound weight loss over the past year.  Tells me that she has a slight decrease in appetite or "I am too lazy to make food".    Patient reports her last colonoscopy about 2-3 years ago in  "[place]".  Tells me this was normal.  We do not have the report and she does not recall where this was done.    Denies fever, chills or change in bowel habits.  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 Anemia   . Arthritis    hands  . Bladder cancer ([place])   . Chronic diarrhea   . Chronic gastritis   . COPD (chronic obstructive pulmonary disease) ([place])    per pt uses neb. as needed  . Depression   . Diverticulosis of colon   . Duodenal ulcer 08/01/2017   multiple per EGD  . GAD (generalized anxiety disorder)   . GERD (gastroesophageal reflux disease)   . Hiatal hernia   . History of bladder cancer 2017   s/p  TURBT's twice   .  History of colonic diverticulitis   . History of GI bleed 08/01/2017   and 10-06-2017 upper GI bleed due to multiple duonenal ulcer , duodenitis , gastritis (transfused 10-06-2017)  . History of renal cell carcinoma    dx 2016---  s/p  left nephrectomy  . Hyperlipidemia   . Osteoporosis   . RLS (restless legs syndrome)   . 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 Urge incontinence of urine     Past Surgical History:  Procedure Laterality Date  . ABDOMINAL HYSTERECTOMY  1973   w/  BSO and Appendectomy  . COLON SURGERY  2015   in [place]   "my colon was reconstructed"  . ESOPHAGOGASTRODUODENOSCOPY N/A 08/06/2017   Procedure: ESOPHAGOGASTRODUODENOSCOPY (EGD);  Surgeon: Clarene Essex, MD;  Location: [facility];  Service: Endoscopy;  Laterality: N/A;  . ESOPHAGOGASTRODUODENOSCOPY (EGD) WITH PROPOFOL Left 10/08/2017   Procedure: ESOPHAGOGASTRODUODENOSCOPY (EGD) WITH PROPOFOL;  Surgeon: Arta Silence, MD;  Location: [facility];  Service: Endoscopy;  Laterality: Left;  . HEMICOLECTOMY  x3  last one 2010   last one w/ ostomy (diverticulitis)  . INGUINAL HERNIA REPAIR Left 1980s  . LAPAROSCOPIC CHOLECYSTECTOMY  1990s  . LEFT NEPHRECTOMY  2016   in [place]   renal cell carcinoma  . TRANSURETHRAL RESECTION OF BLADDER TUMOR  x2  2017  in [place], [place]  . TRANSURETHRAL RESECTION OF BLADDER TUMOR N/A 03/18/2018   Procedure: TRANSURETHRAL RESECTION OF BLADDER TUMOR (TURBT), WITH CYSTOSCOPY, CYSTOGRAM, INSTILATION OF GEMCITABINE;  Surgeon: Ceasar Mons, MD;  Location: [facility];  Service: Urology;  Laterality: N/A;    Family history: No family history of IBD or colon cancer   Social History   Tobacco Use  . Smoking status: Current Some Day Smoker    Years: 60.00    Types: Cigarettes  . Smokeless tobacco: Never Used  . Tobacco comment: 03-16-2018  per pt smokes on 2 wks off 2wks and when smokes 1/5ppd  Substance Use Topics  . Alcohol use: No    Frequency: Never  . Drug use: No    Prior to Admission medications   Medication Sig Start Date End Date Taking? Authorizing Provider  albuterol (PROVENTIL HFA;VENTOLIN HFA) 108 (90 Base) MCG/ACT inhaler Inhale 1-2 puffs into the lungs every 6 (six) hours as needed for wheezing or shortness of breath.   Yes [provider]  ALPRAZolam (XANAX) 0.5 MG tablet Take 1 tablet (0.5 mg total) by mouth 3 (three) times daily as needed for anxiety. Patient taking differently: Take 0.5 mg by  mouth 2 (two) times daily as needed for anxiety.  10/10/17  Yes Bonnielee Haff, MD  colesevelam [facility]) 625 MG tablet Take 625 mg by mouth daily. 04/22/18  Yes [provider]  dicyclomine (BENTYL) 10 MG capsule Take 1 capsule (10 mg total) by mouth 3 (three) times daily as needed for spasms (bowel spasms). Patient taking differently: Take 10 mg by mouth 4 (four) times daily.  10/12/17  Yes Bonnielee Haff, MD  ipratropium-albuterol (DUONEB) 0.5-2.5 (3) MG/3ML SOLN Take 3 mLs by nebulization.   Yes [provider]  pantoprazole (PROTONIX) 40 MG tablet Take 1 tablet (40 mg total) by mouth 2 (two) times daily before a meal. After 1 month take 1 daily in the morning Patient taking differently: Take 40 mg by mouth every morning. After 1 month take 1 daily in the morning 10/10/17  Yes Bonnielee Haff, MD  rOPINIRole (REQUIP) 2 MG tablet Take  2 mg by mouth at bedtime.   Yes [provider]  sertraline (ZOLOFT) 100 MG tablet Take 1 tablet (100 mg total) by mouth at bedtime. 08/08/17  Yes Caren Griffins, MD    Current Facility-Administered Medications  Medication Dose Route Frequency Provider Last Rate Last Dose  . acetaminophen (TYLENOL) tablet 650 mg  650 mg Oral Q6H PRN Etta Quill, DO       Or  . acetaminophen (TYLENOL) suppository 650 mg  650 mg Rectal Q6H PRN Etta Quill, DO      . albuterol (PROVENTIL) (2.5 MG/3ML) 0.083% nebulizer solution 3 mL  3 mL Inhalation Q6H PRN Etta Quill, DO   3 mL at 04/23/18 0517  . ALPRAZolam Duanne Moron) tablet 0.5 mg  0.5 mg Oral BID PRN Etta Quill, DO   0.5 mg at 04/23/18 0129  . colesevelam [facility]) tablet 625 mg  625 mg Oral Daily Alcario Drought, Jared M, DO      . dicyclomine (BENTYL) capsule 10 mg  10 mg Oral QID Jennette Kettle M, DO      . ipratropium-albuterol (DUONEB) 0.5-2.5 (3) MG/3ML nebulizer solution 3 mL  3 mL Nebulization Q6H PRN Etta Quill, DO      . ondansetron [facility]) tablet 4 mg  4 mg Oral Q6H PRN  Etta Quill, DO       Or  . ondansetron [facility]) injection 4 mg  4 mg Intravenous Q6H PRN Etta Quill, DO      . pantoprazole (PROTONIX) EC tablet 40 mg  40 mg Oral q morning - 10a Etta Quill, DO      . rOPINIRole (REQUIP) tablet 2 mg  2 mg Oral QHS Jennette Kettle M, DO   2 mg at 04/23/18 0130  . sertraline (ZOLOFT) tablet 100 mg  100 mg Oral QHS Jennette Kettle M, DO   100 mg at 04/23/18 0132    Allergies as of 04/22/2018 - Review Complete 04/22/2018  Allergen Reaction Noted  . Zosyn [piperacillin sod-tazobactam so] Swelling and Rash 08/01/2017     Review of Systems:    Constitutional: No weight loss, fever or chills Skin: No rash Cardiovascular: No chest pain Respiratory:+SOB Gastrointestinal: See HPI and otherwise negative Genitourinary: No dysuria  Neurological: +positional dizziness Musculoskeletal: No new muscle or joint pain Hematologic: No bleeding Psychiatric: No history of depression or anxiety    Physical Exam:  Vital signs in last 24 hours: Temp:  [98 F (36.7 C)-98.9 F (37.2 C)] 98 F (36.7 C) (09/05 0820) Pulse Rate:  [78-90] 81 (09/05 0820) Resp:  [18-20] 20 (09/05 0820) BP: (120-159)/(66-91) 154/91 (09/05 0820) SpO2:  [93 %-100 %] 99 % (09/05 0820) Weight:  [46.2 kg] 46.2 kg (09/05 0154) Last BM Date: 04/23/18 General:   Pleasant Elderly Caucasian female appears to be in NAD, Well developed, Well nourished, alert and cooperative Head:  Normocephalic and atraumatic. Eyes:   PEERL, EOMI. No icterus. Conjunctiva pink. Ears:  Normal auditory acuity. Neck:  Supple Throat: Oral cavity and pharynx without inflammation, swelling or lesion.  Lungs: Respirations even and unlabored. Lungs clear to auscultation bilaterally.   No wheezes, crackles, or rhonchi. +O2 via [place] Heart: Normal S1, S2. No MRG. Regular rate and rhythm. No peripheral edema, cyanosis or pallor.  Abdomen:  Soft, nondistended, mild b/l lower abdominal ttp and epigastric ttp. No  rebound or guarding. Normal bowel sounds. No appreciable masses or hepatomegaly. Rectal:  Not performed.  Msk:  Symmetrical without gross deformities.  Peripheral pulses intact.  Extremities:  Without edema, no deformity or joint abnormality.  Neurologic:  Alert and  oriented x4;  grossly normal neurologically.  Skin:   Dry and intact without significant lesions or rashes. Psychiatric: Demonstrates good judgement and reason without abnormal affect or behaviors.   LAB RESULTS: Recent Labs    04/22/18 2243  WBC 6.5  HGB 6.9*  HCT 24.8*  PLT 307   BMET Recent Labs    04/22/18 2243  NA 143  K 2.5*  CL 109  CO2 25  GLUCOSE 85  BUN 14  CREATININE 0.90  CALCIUM 8.4*   STUDIES: Dg Chest 2 View  Result Date: 04/23/2018 CLINICAL DATA:  Anemia and weakness. EXAM: CHEST - 2 VIEW COMPARISON:  10/06/2017 FINDINGS: Mild cardiac enlargement. Pulmonary vascularity is normal. Diffuse interstitial pattern to the lungs is unchanged since previous study, likely representing chronic fibrosis. No blunting of costophrenic angles. No pneumothorax. Mediastinal contours appear intact. Calcification of the aorta. IMPRESSION: Cardiac enlargement. 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 No evidence of active pulmonary disease. Electronically Signed   By: Lucienne Capers M.D.   On: 04/23/2018 00:54     Impression / Plan:   Impression: 1.  Symptomatic iron deficiency anemia: Hemoglobin 6.9 yesterday--> 2 units PRBCs--> repeat hemoglobin ordered this morning, iron percent saturation low at 2, iron low at 9, ferritin low at 8, history of duodenal ulcers, last EGD in February, last colonoscopy 2-3 years ago per patient, Hemoccult positive, no signs of melena or bright red blood per rectum per patient; consider GI source of blood loss versus other 2.  History of duodenal ulcers: On multiple EGDs in the past, last in February, previously December 2018  Plan: 1.  Ordered repeat CBC today 2.  Continue to  monitor hemoglobin and transfusion as needed less than 7 3.  Continue Protonix 40 mg, will increase to twice daily for now 4.  Would benefit from repeat EGD.  This may happen today pending what patient's potassium and hemoglobin look like this morning.  Could proceed this afternoon pending those results.  Otherwise we will plan for tomorrow. 5.  Patient to remain n.p.o. this morning.  We will change pending timing of above procedure. 6.  Please await any further recommendations from Dr. Loletha Carrow later today.  Thank you for your kind consultation, we will continue to follow.  Lavone Nian Brayton Baumgartner  04/23/2018, 8:40 AM

## 2018-04-23 NOTE — Care Management Obs Status (Signed)
[place] NOTIFICATION   Patient Details  Name: Erica York MRN: [id number] Date of Birth: [birth year]   Medicare Observation Status Notification Given:  Yes    MahabirJuliann Pulse, RN 04/23/2018, 1:18 PM

## 2018-04-23 NOTE — H&P (Signed)
History and Physical    Erica York RDE:[id number] DOB: 09-21-1940 DOA: 04/22/2018  PCP: Heywood Bene, PA-C  Patient coming from: Home  I have personally briefly reviewed patient's old medical records in [place]  Chief Complaint: Anemia  HPI: Erica York is a 77 y.o. female with medical history significant of Bladder cancer s/p TURBT in July, renal CA s/p L nephrectomy, COPD, prior UGIB from duodenal ulcers and erosive esophagitis.  Patient sent in by PCP today after routine lab work in the office showed HGB of 6.5 down from 7.9 in June this year.  Patient without melena or hematochezia.  She denies any chest pain or new/worsening shortness of breath.  No increased bleeding or bruising.   ED Course: HGB 6.9 in ED.  2u PRBC transfusion ordered.   Review of Systems: As per HPI otherwise 10 point review of systems negative.   Past Medical History:  Diagnosis Date  . Anemia   . Arthritis    hands  . Bladder cancer ([place])   . Chronic diarrhea   . Chronic gastritis   . COPD (chronic obstructive pulmonary disease) ([place])    per pt uses neb. as needed  . Depression   . Diverticulosis of colon   . Duodenal ulcer 08/01/2017   multiple per EGD  . GAD (generalized anxiety disorder)   . GERD (gastroesophageal reflux disease)   . Hiatal hernia   . History of bladder cancer 2017   s/p  TURBT's twice   . History of colonic diverticulitis   . History of GI bleed 08/01/2017   and 10-06-2017 upper GI bleed due to multiple duonenal ulcer , duodenitis , gastritis (transfused 10-06-2017)  . History of renal cell carcinoma    dx 2016---  s/p  left nephrectomy  . Hyperlipidemia   . Osteoporosis   . RLS (restless legs syndrome)   . 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 Urge incontinence of urine     Past Surgical History:  Procedure Laterality Date  . ABDOMINAL HYSTERECTOMY  1973   w/ BSO and Appendectomy  . COLON SURGERY  2015   in [place]     "my colon was reconstructed"  . ESOPHAGOGASTRODUODENOSCOPY N/A 08/06/2017   Procedure: ESOPHAGOGASTRODUODENOSCOPY (EGD);  Surgeon: Clarene Essex, MD;  Location: [facility];  Service: Endoscopy;  Laterality: N/A;  . ESOPHAGOGASTRODUODENOSCOPY (EGD) WITH PROPOFOL Left 10/08/2017   Procedure: ESOPHAGOGASTRODUODENOSCOPY (EGD) WITH PROPOFOL;  Surgeon: Arta Silence, MD;  Location: [facility];  Service: Endoscopy;  Laterality: Left;  . HEMICOLECTOMY  x3  last one 2010   last one w/ ostomy (diverticulitis)  . INGUINAL HERNIA REPAIR Left 1980s  . LAPAROSCOPIC CHOLECYSTECTOMY  1990s  . LEFT NEPHRECTOMY  2016   in [place]   renal cell carcinoma  . TRANSURETHRAL RESECTION OF BLADDER TUMOR  x2  2017  in [place], [place]  . TRANSURETHRAL RESECTION OF BLADDER TUMOR N/A 03/18/2018   Procedure: TRANSURETHRAL RESECTION OF BLADDER TUMOR (TURBT), WITH CYSTOSCOPY, CYSTOGRAM, INSTILATION OF GEMCITABINE;  Surgeon: Ceasar Mons, MD;  Location: [facility];  Service: Urology;  Laterality: N/A;     reports that she has been smoking cigarettes. She has smoked for the past 60.00 years. She has never used smokeless tobacco. She reports that she does not drink alcohol or use drugs.  Allergies  Allergen Reactions  . Zosyn [Piperacillin Sod-Tazobactam So] Swelling and Rash    History reviewed. No pertinent family history.  Prior to Admission medications   Medication Sig Start Date End Date Taking? Authorizing Provider  albuterol (PROVENTIL HFA;VENTOLIN HFA) 108 (90 Base) MCG/ACT inhaler Inhale 1-2 puffs into the lungs every 6 (six) hours as needed for wheezing or shortness of breath.   Yes [provider]  ALPRAZolam (XANAX) 0.5 MG tablet Take 1 tablet (0.5 mg total) by mouth 3 (three) times daily as needed for anxiety. Patient taking differently: Take 0.5 mg by mouth 2 (two) times daily as needed for anxiety.  10/10/17  Yes Bonnielee Haff, MD  colesevelam [facility])  625 MG tablet Take 625 mg by mouth daily. 04/22/18  Yes [provider]  dicyclomine (BENTYL) 10 MG capsule Take 1 capsule (10 mg total) by mouth 3 (three) times daily as needed for spasms (bowel spasms). Patient taking differently: Take 10 mg by mouth 4 (four) times daily.  10/12/17  Yes Bonnielee Haff, MD  ipratropium-albuterol (DUONEB) 0.5-2.5 (3) MG/3ML SOLN Take 3 mLs by nebulization.   Yes [provider]  pantoprazole (PROTONIX) 40 MG tablet Take 1 tablet (40 mg total) by mouth 2 (two) times daily before a meal. After 1 month take 1 daily in the morning Patient taking differently: Take 40 mg by mouth every morning. After 1 month take 1 daily in the morning 10/10/17  Yes Bonnielee Haff, MD  rOPINIRole (REQUIP) 2 MG tablet Take 2 mg by mouth at bedtime.   Yes [provider]  sertraline (ZOLOFT) 100 MG tablet Take 1 tablet (100 mg total) by mouth at bedtime. 08/08/17  Yes Caren Griffins, MD    Physical Exam: Vitals:   04/22/18 2153  BP: 138/66  Pulse: 90  Resp: 18  Temp: 98.1 F (36.7 C)  TempSrc: Oral  SpO2: 93%    Constitutional: NAD, calm, comfortable Eyes: PERRL, lids and conjunctivae normal ENMT: Mucous membranes are moist. Posterior pharynx clear of any exudate or lesions.Normal dentition.  Neck: normal, supple, no masses, no thyromegaly Respiratory: clear to auscultation bilaterally, no wheezing, no crackles. Normal respiratory effort. No accessory muscle use.  Cardiovascular: Regular rate and rhythm, no murmurs / rubs / gallops. No extremity edema. 2+ pedal pulses. No carotid bruits.  Abdomen: no tenderness, no masses palpated. No hepatosplenomegaly. Bowel sounds positive.  Musculoskeletal: no clubbing / cyanosis. No joint deformity upper and lower extremities. Good ROM, no contractures. Normal muscle tone.  Skin: no rashes, lesions, ulcers. No induration Neurologic: CN 2-12 grossly intact. Sensation intact, DTR normal. Strength 5/5 in all 4.   Psychiatric: Normal judgment and insight. Alert and oriented x 3. Normal mood.    Labs on Admission: I have personally reviewed following labs and imaging studies  CBC: Recent Labs  Lab 04/22/18 2243  WBC 6.5  HGB 6.9*  HCT 24.8*  MCV 71.3*  PLT 202   Basic Metabolic Panel: Recent Labs  Lab 04/22/18 2243  NA 143  K 2.5*  CL 109  CO2 25  GLUCOSE 85  BUN 14  CREATININE 0.90  CALCIUM 8.4*   GFR: CrCl cannot be calculated (Unknown ideal weight.). Liver Function Tests: No results for input(s): AST, ALT, ALKPHOS, BILITOT, PROT, ALBUMIN in the last 168 hours. No results for input(s): LIPASE, AMYLASE in the last 168 hours. No results for input(s): AMMONIA in the last 168 hours. Coagulation Profile: No results for input(s): INR, PROTIME in the last 168 hours. Cardiac Enzymes: No results for input(s): CKTOTAL, CKMB, CKMBINDEX, TROPONINI in the last 168 hours. BNP (last 3 results) No results for input(s):  PROBNP in the last 8760 hours. HbA1C: No results for input(s): HGBA1C in the last 72 hours. CBG: No results for input(s): GLUCAP in the last 168 hours. Lipid Profile: No results for input(s): CHOL, HDL, LDLCALC, TRIG, CHOLHDL, LDLDIRECT in the last 72 hours. Thyroid Function Tests: No results for input(s): TSH, T4TOTAL, FREET4, T3FREE, THYROIDAB in the last 72 hours. Anemia Panel: No results for input(s): VITAMINB12, FOLATE, FERRITIN, TIBC, IRON, RETICCTPCT in the last 72 hours. Urine analysis:    Component Value Date/Time   COLORURINE STRAW (A) 08/01/2017 0838   APPEARANCEUR CLEAR 08/01/2017 0838   LABSPEC 1.011 08/01/2017 0838   PHURINE 6.0 08/01/2017 0838   GLUCOSEU NEGATIVE 08/01/2017 0838   HGBUR NEGATIVE 08/01/2017 0838   BILIRUBINUR NEGATIVE 08/01/2017 0838   KETONESUR NEGATIVE 08/01/2017 0838   PROTEINUR NEGATIVE 08/01/2017 0838   NITRITE NEGATIVE 08/01/2017 0838   LEUKOCYTESUR TRACE (A) 08/01/2017 0838    Radiological Exams on Admission: No results  found.  EKG: Independently reviewed.  Assessment/Plan Principal Problem:   Anemia Active Problems:   History of bladder cancer   History of renal cell cancer    1. Anemia - chronic GI blood loss suspected, iron def also possible 1. Anemia PNL ordered 2. 2u PRBC transfusion ordered 3. No gross acute GIB 4. Call GI in AM 5. Clear liquid diet for now in case they want to EGD patient 2. Hypokalemia - replace K  DVT prophylaxis: SCDs Code Status: Full Family Communication: No family in room Disposition Plan: Home after admit Consults called: None Admission status: Place in obs   Shamonique Battiste, [place] [facility] Pager [phone] Only works nights!  If 7AM-7PM, please contact the primary day team physician taking care of patient  [email or web address] Password [facility]  04/23/2018, 12:39 AM

## 2018-04-24 DIAGNOSIS — K257 Chronic gastric ulcer without hemorrhage or perforation: Secondary | ICD-10-CM

## 2018-04-24 DIAGNOSIS — K269 Duodenal ulcer, unspecified as acute or chronic, without hemorrhage or perforation: Secondary | ICD-10-CM

## 2018-04-24 DIAGNOSIS — Z720 Tobacco use: Secondary | ICD-10-CM

## 2018-04-24 LAB — BASIC METABOLIC PANEL
ANION GAP: 10 (ref 5–15)
BUN: 6 mg/dL — AB (ref 8–23)
CALCIUM: 9 mg/dL (ref 8.9–10.3)
CO2: 25 mmol/L (ref 22–32)
CREATININE: 0.68 mg/dL (ref 0.44–1.00)
Chloride: 106 mmol/L (ref 98–111)
GFR calc Af Amer: >60 mL/min (ref >60)
GLUCOSE: 123 mg/dL — AB (ref 70–99)
Potassium: 3.4 mmol/L — ABNORMAL LOW (ref 3.5–5.1)
Sodium: 141 mmol/L (ref 135–145)

## 2018-04-24 LAB — TYPE AND SCREEN
ABO/RH(D): O POS
ANTIBODY SCREEN: POSITIVE
DONOR AG TYPE: NEGATIVE
Donor AG Type: NEGATIVE
UNIT DIVISION: 0
Unit division: 0

## 2018-04-24 LAB — CBC
HCT: 30.1 % — ABNORMAL LOW (ref 36.0–46.0)
Hemoglobin: 8.8 g/dL — ABNORMAL LOW (ref 12.0–15.0)
MCH: 22.1 pg — AB (ref 26.0–34.0)
MCHC: 29.2 g/dL — AB (ref 30.0–36.0)
MCV: 75.4 fL — ABNORMAL LOW (ref 78.0–100.0)
Platelets: 244 10*3/uL (ref 150–400)
RBC: 3.99 MIL/uL (ref 3.87–5.11)
RDW: 20.6 % — AB (ref 11.5–15.5)
WBC: 5.5 10*3/uL (ref 4.0–10.5)

## 2018-04-24 LAB — BPAM RBC
BLOOD PRODUCT EXPIRATION DATE: 201909242359
BLOOD PRODUCT EXPIRATION DATE: 201909282359
ISSUE DATE / TIME: 201909050220
ISSUE DATE / TIME: 201909050524
UNIT TYPE AND RH: 5100
Unit Type and Rh: 9500

## 2018-04-24 LAB — MAGNESIUM: MAGNESIUM: 2 mg/dL (ref 1.7–2.4)

## 2018-04-24 MED ORDER — POLYSACCHARIDE IRON COMPLEX 150 MG PO CAPS
150.0000 mg | ORAL_CAPSULE | Freq: Every day | ORAL | Status: DC
  Administered 2018-04-25: 150 mg via ORAL
  Filled 2018-04-24: qty 1

## 2018-04-24 MED ORDER — PROPRANOLOL HCL 20 MG PO TABS
40.0000 mg | ORAL_TABLET | Freq: Two times a day (BID) | ORAL | Status: DC
  Administered 2018-04-24 – 2018-04-25 (×2): 40 mg via ORAL
  Filled 2018-04-24 (×2): qty 2

## 2018-04-24 MED ORDER — IPRATROPIUM-ALBUTEROL 0.5-2.5 (3) MG/3ML IN SOLN
3.0000 mL | Freq: Four times a day (QID) | RESPIRATORY_TRACT | Status: DC
  Administered 2018-04-24 – 2018-04-25 (×6): 3 mL via RESPIRATORY_TRACT
  Filled 2018-04-24 (×6): qty 3

## 2018-04-24 MED ORDER — TRAMADOL-ACETAMINOPHEN 37.5-325 MG PO TABS
1.0000 | ORAL_TABLET | Freq: Four times a day (QID) | ORAL | Status: DC | PRN
  Administered 2018-04-24 – 2018-04-25 (×2): 1 via ORAL
  Filled 2018-04-24 (×2): qty 1

## 2018-04-24 NOTE — Progress Notes (Signed)
SATURATION QUALIFICATIONS: (This note is used to comply with regulatory documentation for home oxygen)  Patient Saturations on Room Air at Rest = 89%  Patient Saturations on Room Air while Ambulating = 82%  Patient Saturations on 2 Liters of oxygen while Ambulating = 95%  Please briefly explain why patient needs home oxygen:  Pt ambulated 47ft on room air, O2 sats dropped to 82 and pt became short of breath. With 2L of O2 by nasal cannula O2 sats came up to 95 while ambulating.   Yenifer Saccente, Bing Neighbors, RN

## 2018-04-24 NOTE — Progress Notes (Signed)
PROGRESS NOTE [facility]   Erica York   ATF:[id number] DOB: July 30, 1941  DOA: 04/22/2018 PCP: Heywood Bene, PA-C   Brief Narrative:  Erica York is a 77 yo woman who was sent to the ED Wednesday afternoon after labs collected by her PCP demonstrated anemia. Patient PMH significant for UGI bleeds (07/2017 & 09/2017) secondary to duodenal ulcers, COPD, bladder cancer s/p TURBT in July, renal cancer s/p left nephrectomy, IBD, GERD w/ erosive esophagitis, headaches, and diverticulitis. No blood found on DRE in ED, but fecal occult blood test was positive. Labs demonstrated microcytic, iron-deficiency anemia. Transfused w/ 2 units PRBCs. CXR revealed cardiomegaly but no active pulmonary disease. Head CT yesterday revealed no abnormal findings. Patient had an acute COPD exacerbation while admitted and, given her tenuous respiratory status and recent imaging, the risks of endoscopic procedure outweigh the benefits.   Subjective: Patient reports that she feels significantly improved since yesterday. She is able to ambulate to the restroom without assistance and has had only minimal SOB w/ exertion. She states that the Ultram has helped with her headache but that there is room for improvement. She voices understanding of importance of smoking and Excedrin cessation. Denies nausea. Denies melena or hematochezia. She reports that her two sons live within five miles of her home and that she will have a good support system in place upon discharge.    Assessment & Plan:   Principal Problem:   Anemia Active Problems:   History of bladder cancer   History of renal cell cancer   GI bleed   Acute blood loss anemia   Hypokalemia   COPD with acute exacerbation (HCC)   Chronic nonintractable headache  Acute on chronic iron-deficiency anemia, suspected GI bleed Hgb has improved from 6.9 to 8.8 with her 2 units of PRBCs and 1 unit Feraheme. Suspected upper GI bleed given her personal  history and risk factors of smoking and Excedrin-use.  -  Continue Protonix 40 mg BID - Continue Carafate -  Encouraged smoking and Excedrin cessation  COPD Patient is significantly improved from her acute respiratory exacerbation yesterday. No wheezing on exam. Patient reports only minimal SOB w/ exertion. -  Wean patient off of her supplemental O2 keeping sats above 89% - Continue Brovana - Continue Pulmicort - Continue Duoneb - Continue albuterol nebulizer PRN - Discussed smoking cessation  Headaches Daily HA lessened but not fully-resolved by the Ultram. Do not wish for her to revert back to Excedrin. Head CT revealed no abnormal findings.  - Will discontinue Ultram -  Will start Ultracet (tramadol + acetaminophen)   DVT prophylaxis: SCDs. Code Status: Full-code. Family Communication: No family in room. Disposition Plan: Continue monitoring, will consider d/c tomorrow.   Consultants:   Gastroenterology, Levin Erp PA  Procedures:   None.  Antimicrobials:  None.   Objective: Vitals:   04/24/18 0500 04/24/18 0837 04/24/18 0842 04/24/18 0848  BP: 114/66     Pulse: 90     Resp: 16     Temp: 98 F (36.7 C)     TempSrc: Oral     SpO2: 95% 96% 96% 96%  Weight:      Height:        Intake/Output Summary (Last 24 hours) at 04/24/2018 1146 Last data filed at 04/23/2018 1500 Gross per 24 hour  Intake 117 ml  Output -  Net 117 ml   Filed Weights   04/23/18 0154  Weight: 46.2 kg    Examination:  General exam: Appears  calm and comfortable  Respiratory system: CTA bilaterally w/ no wheezes, rales, or rhonchi. O2 via nasal cannula. Cardiovascular system: S1 & S2 heard, RRR. No JVD, murmurs, rubs or gallops Gastrointestinal system: Abdomen is nondistended, soft and nontender. No organomegaly or masses felt. Normal bowel sounds heard. Central nervous system: Alert and oriented. No focal neurological deficits. Extremities: No pedal edema.   Psychiatry:  Judgement and insight appear normal. Mood & affect appropriate.    Data Reviewed: I have personally reviewed following labs and imaging studies  CBC: Recent Labs  Lab 04/22/18 2243 04/23/18 0912 04/24/18 0543  WBC 6.5 5.8 5.5  HGB 6.9* 8.9* 8.8*  HCT 24.8* 30.5* 30.1*  MCV 71.3* 76.3* 75.4*  PLT 307 260 546   Basic Metabolic Panel: Recent Labs  Lab 04/22/18 2243 04/23/18 0912 04/24/18 0543  NA 143 142 141  K 2.5* 3.3* 3.4*  CL 109 109 106  CO2 25 26 25   GLUCOSE 85 94 123*  BUN 14 10 6*  CREATININE 0.90 0.69 0.68  CALCIUM 8.4* 8.4* 9.0  MG  --   --  2.0   GFR: Estimated Creatinine Clearance: 38.7 mL/min (by C-G formula based on SCr of 0.68 mg/dL). Liver Function Tests: No results for input(s): AST, ALT, ALKPHOS, BILITOT, PROT, ALBUMIN in the last 168 hours. No results for input(s): LIPASE, AMYLASE in the last 168 hours. No results for input(s): AMMONIA in the last 168 hours. Coagulation Profile: No results for input(s): INR, PROTIME in the last 168 hours. Cardiac Enzymes: No results for input(s): CKTOTAL, CKMB, CKMBINDEX, TROPONINI in the last 168 hours. BNP (last 3 results) No results for input(s): PROBNP in the last 8760 hours. HbA1C: No results for input(s): HGBA1C in the last 72 hours. CBG: No results for input(s): GLUCAP in the last 168 hours. Lipid Profile: No results for input(s): CHOL, HDL, LDLCALC, TRIG, CHOLHDL, LDLDIRECT in the last 72 hours. Thyroid Function Tests: No results for input(s): TSH, T4TOTAL, FREET4, T3FREE, THYROIDAB in the last 72 hours. Anemia Panel: Recent Labs    04/23/18 0041  VITAMINB12 158*  FOLATE 8.8  FERRITIN 8*  TIBC 439  IRON 9*  RETICCTPCT 1.5   Sepsis Labs: No results for input(s): PROCALCITON, LATICACIDVEN in the last 168 hours.  No results found for this or any previous visit (from the past 240 hour(s)).    Radiology Studies: Dg Chest 2 View  Result Date: 04/23/2018 CLINICAL DATA:  Anemia and weakness.  EXAM: CHEST - 2 VIEW COMPARISON:  10/06/2017 FINDINGS: Mild cardiac enlargement. Pulmonary vascularity is normal. Diffuse interstitial pattern to the lungs is unchanged since previous study, likely representing chronic fibrosis. No blunting of costophrenic angles. No pneumothorax. Mediastinal contours appear intact. Calcification of the aorta. IMPRESSION: Cardiac enlargement. 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 No evidence of active pulmonary disease. Electronically Signed   By: Lucienne Capers M.D.   On: 04/23/2018 00:54   Mr Brain Wo Contrast  Result Date: 04/23/2018 CLINICAL DATA:  Headache. History of GI bleeding. History of bladder cancer and renal cancer. EXAM: MRI HEAD WITHOUT CONTRAST TECHNIQUE: Multiplanar, multiecho pulse sequences of the brain and surrounding structures were obtained without intravenous contrast. COMPARISON:  None. FINDINGS: Brain: Image quality degraded by motion Generalized atrophy. Negative for acute infarct. Mild chronic white matter ischemia. Diffuse abnormality of the intracranial circulation including arteries and veins. There is T1 shortening of the vessels which are markedly low signal on gradient echo imaging. Findings compatible with intravenous iron therapy for chronic anemia. Vascular: Negative  for hyperdense vessel. Skull and upper cervical spine: Negative Sinuses/Orbits: Negative Other: None IMPRESSION: No acute intracranial abnormality Diffuse vascular abnormality compatible with intravenous iron infusion therapy for chronic anemia. Electronically Signed   By: Franchot Gallo M.D.   On: 04/23/2018 14:33      Scheduled Meds: . arformoterol  15 mcg Nebulization BID  . budesonide (PULMICORT) nebulizer solution  0.25 mg Nebulization BID  . colesevelam  625 mg Oral Daily  . dicyclomine  10 mg Oral QID  . feeding supplement  1 Container Oral TID BM  . ipratropium-albuterol  3 mL Nebulization Q6H  . pantoprazole  40 mg Oral BID  . rOPINIRole  2 mg Oral QHS   . sertraline  100 mg Oral QHS  . sucralfate  1 g Oral TID WC & HS   Continuous Infusions:   LOS: 1 day    Time spent: Total of 15 minutes spent with pt, greater than 50% of which was spent in discussion of  treatment, counseling and coordination of care    Krista Blue, PA-S Pager: Text Page via [email or web address]   If 7PM-7AM, please contact night-coverage [email or web address] 04/24/2018, 11:46 AM   Note - This record has been created using [employer]. Chart creation errors have been sought, but may not always have been located. Such creation errors do not reflect on the standard of medical care.

## 2018-04-24 NOTE — Progress Notes (Addendum)
Progress Note   Subjective  Chief Complaint: iron deficiency Anemia  Today patient reports that she is now suffering from back pain and continued loose stools.  Also complains of a headache for which her new medication Ultram is not helping.  Tells me that is why she was taking her Excedrin.  Denies any other new complaints.  Has not seen any black stools.    Objective   Vital signs in last 24 hours: Temp:  [98 F (36.7 C)-98.2 F (36.8 C)] 98 F (36.7 C) (09/06 0500) Pulse Rate:  [78-92] 90 (09/06 0500) Resp:  [16-19] 16 (09/06 0500) BP: (114-150)/(66-88) 114/66 (09/06 0500) SpO2:  [92 %-99 %] 96 % (09/06 0848) Last BM Date: 04/24/18 General:    Elderly, ill-appearing, white female in NAD Heart:  Regular rate and rhythm; no murmurs Lungs: Respirations even and unlabored, lungs CTA bilaterally + O2 via nasal cannula Abdomen:  Soft, nontender and nondistended. Normal bowel sounds. Extremities:  Without edema. Neurologic:  Alert and oriented,  grossly normal neurologically. Psych:  Cooperative. Normal mood and affect.  Intake/Output from previous day: 09/05 0701 - 09/06 0700 In: 423 [Blood:306; IV Piggyback:117] Out: -   Lab Results: Recent Labs    04/22/18 2243 04/23/18 0912 04/24/18 0543  WBC 6.5 5.8 5.5  HGB 6.9* 8.9* 8.8*  HCT 24.8* 30.5* 30.1*  PLT 307 260 244   BMET Recent Labs    04/22/18 2243 04/23/18 0912 04/24/18 0543  NA 143 142 141  K 2.5* 3.3* 3.4*  CL 109 109 106  CO2 25 26 25   GLUCOSE 85 94 123*  BUN 14 10 6*  CREATININE 0.90 0.69 0.68  CALCIUM 8.4* 8.4* 9.0   Studies/Results: Dg Chest 2 View  Result Date: 04/23/2018 CLINICAL DATA:  Anemia and weakness. EXAM: CHEST - 2 VIEW COMPARISON:  10/06/2017 FINDINGS: Mild cardiac enlargement. Pulmonary vascularity is normal. Diffuse interstitial pattern to the lungs is unchanged since previous study, likely representing chronic fibrosis. No blunting of costophrenic angles. No pneumothorax.  Mediastinal contours appear intact. Calcification of the aorta. IMPRESSION: Cardiac enlargement. 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 No evidence of active pulmonary disease. Electronically Signed   By: Lucienne Capers M.D.   On: 04/23/2018 00:54   Mr Brain Wo Contrast  Result Date: 04/23/2018 CLINICAL DATA:  Headache. History of GI bleeding. History of bladder cancer and renal cancer. EXAM: MRI HEAD WITHOUT CONTRAST TECHNIQUE: Multiplanar, multiecho pulse sequences of the brain and surrounding structures were obtained without intravenous contrast. COMPARISON:  None. FINDINGS: Brain: Image quality degraded by motion Generalized atrophy. Negative for acute infarct. Mild chronic white matter ischemia. Diffuse abnormality of the intracranial circulation including arteries and veins. There is T1 shortening of the vessels which are markedly low signal on gradient echo imaging. Findings compatible with intravenous iron therapy for chronic anemia. Vascular: Negative for hyperdense vessel. Skull and upper cervical spine: Negative Sinuses/Orbits: Negative Other: None IMPRESSION: No acute intracranial abnormality Diffuse vascular abnormality compatible with intravenous iron infusion therapy for chronic anemia. Electronically Signed   By: Franchot Gallo M.D.   On: 04/23/2018 14:33       Assessment / Plan:   Assessment: 1.  Iron deficiency anemia: Hemoglobin 6.9--> 2 units PRBCs--> 8.9-->8.8, history of duodenal ulcers which are likely still the source as patient continues to use Excedrin for her headaches and smoke, patient with severe COPD is not a candidate for endoscopic work-up at this time, hemoglobin has remained stable overnight, will advance diet 2.  History of duodenal ulcers  Plan: 1.  Hemoglobin has remained stable overnight.  See Dr. Corena Pilgrim note from yesterday, no plans for endoscopic evaluation given patient's severe COPD. 2.  Again advised the patient to discontinue Excedrin.  She tells me  she will need something for headaches going forward. 3.  Patient will be continued on a twice daily PPI at time of discharge 4.  Increased diet to regular today 5.  If hemoglobin remains stable could consider discharge later today versus tomorrow morning 6.  Please await any further recommendations from Dr. Loletha Carrow later today  Thank you for your kind consultation.    LOS: 1 day   Levin Erp  04/24/2018, 9:47 AM   I have discussed the case with the PA, and that is the plan I formulated. I personally interviewed and examined the patient.  She is medically stable, with no overt GI bleeding and a hemoglobin that is improved after transfusion.  Her respiratory status remains fair, and I am not currently planning to perform an upper endoscopy unless she has brisk, overt GI bleeding with a high likelihood of requiring endoscopic intervention.  She is a very high respiratory risk for sedation.  I suspect she still has gastric/duodenal ulcers from continued use of aspirin and, to a lesser degree, from smoking.  I am hopeful she may avoid aspirin-containing products, will remain on twice daily PPI indefinitely, perhaps quit smoking area we have started regular diet and will sign off.  Call as the need arises.  Total time 25 minutes, over half spent face-to-face with patient discussing overall scenario and plan with advice as noted above.  Plan reviewed with her primary team.  Nelida Meuse III Office: [phone]

## 2018-04-24 NOTE — Progress Notes (Signed)
Pt. removed Q4 0000 scheduled aerosol treatment few minutes into treatment, was sleeping prior to being awoken in no distress, wantin to sleep at this time, RN made aware.

## 2018-04-25 DIAGNOSIS — G43809 Other migraine, not intractable, without status migrainosus: Secondary | ICD-10-CM

## 2018-04-25 LAB — CBC WITH DIFFERENTIAL/PLATELET
BASOS ABS: 0 10*3/uL (ref 0.0–0.1)
BASOS PCT: 0 %
EOS ABS: 0.1 10*3/uL (ref 0.0–0.7)
Eosinophils Relative: 1 %
HCT: 33 % — ABNORMAL LOW (ref 36.0–46.0)
HEMOGLOBIN: 9.6 g/dL — AB (ref 12.0–15.0)
LYMPHS ABS: 1.2 10*3/uL (ref 0.7–4.0)
LYMPHS PCT: 8 %
MCH: 22.2 pg — ABNORMAL LOW (ref 26.0–34.0)
MCHC: 29.1 g/dL — ABNORMAL LOW (ref 30.0–36.0)
MCV: 76.4 fL — ABNORMAL LOW (ref 78.0–100.0)
Monocytes Absolute: 1 10*3/uL (ref 0.1–1.0)
Monocytes Relative: 7 %
NEUTROS ABS: 12.3 10*3/uL — AB (ref 1.7–7.7)
Neutrophils Relative %: 84 %
Platelets: 322 10*3/uL (ref 150–400)
RBC: 4.32 MIL/uL (ref 3.87–5.11)
RDW: 21.6 % — AB (ref 11.5–15.5)
WBC: 14.6 10*3/uL — ABNORMAL HIGH (ref 4.0–10.5)

## 2018-04-25 LAB — BASIC METABOLIC PANEL
ANION GAP: 9 (ref 5–15)
BUN: 11 mg/dL (ref 8–23)
CALCIUM: 9.4 mg/dL (ref 8.9–10.3)
CO2: 29 mmol/L (ref 22–32)
CREATININE: 0.74 mg/dL (ref 0.44–1.00)
Chloride: 102 mmol/L (ref 98–111)
GFR calc Af Amer: >60 mL/min (ref >60)
GLUCOSE: 99 mg/dL (ref 70–99)
Potassium: 3.4 mmol/L — ABNORMAL LOW (ref 3.5–5.1)
Sodium: 140 mmol/L (ref 135–145)

## 2018-04-25 LAB — URINALYSIS, ROUTINE W REFLEX MICROSCOPIC
BILIRUBIN URINE: NEGATIVE
Glucose, UA: NEGATIVE mg/dL
KETONES UR: NEGATIVE mg/dL
LEUKOCYTES UA: NEGATIVE
NITRITE: NEGATIVE
PROTEIN: NEGATIVE mg/dL
Specific Gravity, Urine: 1.009 (ref 1.005–1.030)
pH: 7 (ref 5.0–8.0)

## 2018-04-25 LAB — MAGNESIUM: Magnesium: 1.8 mg/dL (ref 1.7–2.4)

## 2018-04-25 MED ORDER — PANTOPRAZOLE SODIUM 40 MG PO TBEC: 40.0000 mg | DELAYED_RELEASE_TABLET | Freq: Two times a day (BID) | ORAL | 0 refills | Status: AC

## 2018-04-25 MED ORDER — DM-GUAIFENESIN ER 30-600 MG PO TB12
1.0000 | ORAL_TABLET | Freq: Two times a day (BID) | ORAL | Status: DC
  Administered 2018-04-25: 1 via ORAL
  Filled 2018-04-25: qty 1

## 2018-04-25 MED ORDER — ALBUTEROL SULFATE HFA 108 (90 BASE) MCG/ACT IN AERS: 2.0000 | INHALATION_SPRAY | Freq: Four times a day (QID) | RESPIRATORY_TRACT | 0 refills | Status: AC | PRN

## 2018-04-25 MED ORDER — POLYSACCHARIDE IRON COMPLEX 150 MG PO CAPS: 150.0000 mg | ORAL_CAPSULE | Freq: Every day | ORAL | 0 refills | Status: AC

## 2018-04-25 MED ORDER — PREDNISONE 20 MG PO TABS
40.0000 mg | ORAL_TABLET | Freq: Every day | ORAL | Status: DC
  Administered 2018-04-25: 40 mg via ORAL
  Filled 2018-04-25: qty 2

## 2018-04-25 MED ORDER — ALPRAZOLAM 0.5 MG PO TABS: 0.5000 mg | ORAL_TABLET | Freq: Two times a day (BID) | ORAL | Status: AC | PRN

## 2018-04-25 MED ORDER — PROPRANOLOL HCL 40 MG PO TABS: 40.0000 mg | ORAL_TABLET | Freq: Two times a day (BID) | ORAL | 0 refills | Status: AC

## 2018-04-25 MED ORDER — PREDNISONE 20 MG PO TABS: 40.0000 mg | ORAL_TABLET | Freq: Every day | ORAL | 0 refills | Status: AC

## 2018-04-25 MED ORDER — TRAMADOL-ACETAMINOPHEN 37.5-325 MG PO TABS: 1.0000 | ORAL_TABLET | Freq: Four times a day (QID) | ORAL | 0 refills | Status: AC | PRN

## 2018-04-25 MED ORDER — TIOTROPIUM BROMIDE MONOHYDRATE 18 MCG IN CAPS: 18.0000 ug | ORAL_CAPSULE | Freq: Every day | RESPIRATORY_TRACT | 0 refills | Status: AC

## 2018-04-25 MED ORDER — DM-GUAIFENESIN ER 30-600 MG PO TB12: 1.0000 | ORAL_TABLET | Freq: Two times a day (BID) | ORAL | Status: AC | PRN

## 2018-04-25 MED ORDER — SUCRALFATE 1 GM/10ML PO SUSP: 1.0000 g | Freq: Three times a day (TID) | ORAL | 0 refills | Status: AC

## 2018-04-25 MED ORDER — MOMETASONE FURO-FORMOTEROL FUM 200-5 MCG/ACT IN AERO: 2.0000 | INHALATION_SPRAY | Freq: Two times a day (BID) | RESPIRATORY_TRACT | 0 refills | Status: AC

## 2018-04-25 NOTE — Care Management Note (Addendum)
Case Management Note  Patient Details  Name: Keauna Brasel MRN: [id number] Date of Birth: Jan 21, 1941  Subjective/Objective:    Acute COPD exac                Action/Plan: NCM spoke to pt and offered choice for HH/list provided. Pt requested [facility] for HH. Contacted [facility] for oxygen and HH for home. Pt has neb machine. States her sons check on her daily.    Expected Discharge Date:  04/25/18               Expected Discharge Plan:  [place]  In-House Referral:  NA  Discharge planning Services  CM Consult  Post Acute Care Choice:  Home Health Choice offered to:  Patient  DME Arranged:  Oxygen DME Agency:  [facility] Arranged:  RN, aide, Social Worker, Respiratory Care [place] Agency:  [facility]  Status of Service:  Completed, signed off  If discussed at [place] of Stay Meetings, dates discussed:    Additional Comments:  Erenest Rasher, RN 04/25/2018, 3:56 PM

## 2018-04-25 NOTE — Discharge Summary (Signed)
Physician Discharge Summary  Erica York  JSE:[id number]  DOB: 08/02/1941  DOA: 04/22/2018 PCP: Heywood Bene, PA-C  Admit date: 04/22/2018 Discharge date: 04/25/2018  Admitted From: Home  Disposition: Home   Recommendations for Outpatient Follow-up:  1. Follow up with PCP in 1 week  2. Please obtain BMP/CBC in one week to monitor Hgb and renal function  3. Follow up with GI in 2-4 weeks  4. Avoid NSAID's including Excedrin, ibuprofen, naproxen, etc   Equipment/Devices: O2 2L [place]   Discharge Condition: Stable  CODE STATUS: Full Code  Diet recommendation: Heart Healthy  Brief/Interim Summary: For full details see H&P/Progress note, but in brief, Erica York is a is 77 year old female with past medical history significant for chronic iron deficiency anemia, recurrent upper GI bleed with duodenal ulcer/erosive esophagitis, COPD, bladder cancer status post TURBT in July, renal CA status post left nephrectomy and recurrent headaches. Patient presented to the emergency department after being sent by PCP when lab work-up showed hemoglobin of 6.5 down from 7.9 about 2 months prior to admission. Upon ED evaluation hemoglobin was found to be 6.9, she was asymptomatic, FOBT was positive. Patient was admitted with working diagnosis of possible upper GI bleed. She was transfused 2 units and GI was consulted. Subsequently developed with COPD exacerbation.  Subjective: Patient seen and examined, she became dyspneic overnight without steroids however steroids given this morning and felt significantly improved.  Moving air much better and ambulating with no issues.  Discharge Diagnoses/Hospital Course:  Principal Problem:   Anemia Active Problems:   History of bladder cancer   History of renal cell cancer   GI bleed   Acute blood loss anemia   Hypokalemia   COPD with acute exacerbation (HCC)   Chronic nonintractable headache   Tobacco abuse  Acute on chronic blood loss anemia/  Iron deficiency - hgb stable  Suspect upper GI bleed in combination with iron deficiency anemia Status post 2 units of PRBCs. With good response to hemoglobin. No signs of overt bleeding Iron panel shows iron deficiency, s/p 1 unit of Feraheme. Continue Iron supplement.  Next CBC in 1 week  Upper GI bleed Per GI no endoscopy at this time unless that she shows signs or symptoms of brisk bleeding. Risk of EGD outweigh the benefits at this point given patient's comorbidity especially with COPD.  Patient tolerated well diet, no signs of GI bleed hemoglobin has remained stable. Treatment with PPI 40 mg BID indefinitely and Carafate Avoid NSAIDs, including Excedrin, aspirin, ibuprofen, naproxen, etc. smoking cessation discussed.  Follow-up with GI.   Acute COPD exacerbation Respiratory status improved, she responded well to Solu-Medrol well.  Trial off steroids was done however patient became dyspneic again, she was started on prednisone and respiratory status improved.  Will discharge on 5 days of prednisone.  Try to wean off oxygen however failed, pulse ox with ambulation on room air down to 85%.  She requires oxygen at home at 2 L nasal cannula.  Case manager arranged for oxygen delivery.  Will discharge on Dulera twice daily, Spiriva and albuterol as needed.  Strongly discussed smoking cessation  Headaches  Suspect migraine headaches, MRI of the brain with no acute abnormalities.  She reported migraines every day, started on propanolol for prophylaxis, will prescribe Ultracet for breakthrough pain.  Follow-up with PCP to monitor migraines.  May need alternative abortive therapy.  Hypokalemia Resolved  Tobacco abuse Smoking cessation discussed  All other chronic medical condition were stable during the hospitalization.  On the day of the discharge the patient's vitals were stable, and no other acute medical condition were reported by patient. the patient was felt safe to be discharge to home    Discharge Instructions  You were cared for by a hospitalist during your hospital stay. If you have any questions about your discharge medications or the care you received while you were in the hospital after you are discharged, you can call the unit and asked to speak with the hospitalist on call if the hospitalist that took care of you is not available. Once you are discharged, your primary care physician will handle any further medical issues. Please note that NO REFILLS for any discharge medications will be authorized once you are discharged, as it is imperative that you return to your primary care physician (or establish a relationship with a primary care physician if you do not have one) for your aftercare needs so that they can reassess your need for medications and monitor your lab values.  Discharge Instructions    Call MD for:  difficulty breathing, headache or visual disturbances   Complete by:  As directed    Call MD for:  extreme fatigue   Complete by:  As directed    Call MD for:  hives   Complete by:  As directed    Call MD for:  persistant dizziness or light-headedness   Complete by:  As directed    Call MD for:  persistant nausea and vomiting   Complete by:  As directed    Call MD for:  redness, tenderness, or signs of infection (pain, swelling, redness, odor or green/yellow discharge around incision site)   Complete by:  As directed    Call MD for:  severe uncontrolled pain   Complete by:  As directed    Call MD for:  temperature >100.4   Complete by:  As directed    Diet - low sodium heart healthy   Complete by:  As directed    Increase activity slowly   Complete by:  As directed      Allergies as of 04/25/2018      Reactions   Zosyn [piperacillin Sod-tazobactam So] Swelling, Rash      Medication List    TAKE these medications   albuterol 108 (90 Base) MCG/ACT inhaler Commonly known as:  PROVENTIL HFA;VENTOLIN HFA Inhale 2 puffs into the lungs every 6 (six)  hours as needed for wheezing or shortness of breath. What changed:  how much to take   ALPRAZolam 0.5 MG tablet Commonly known as:  XANAX Take 1 tablet (0.5 mg total) by mouth 2 (two) times daily as needed for anxiety.   colesevelam 625 MG tablet Commonly known as:  WELCHOL Take 625 mg by mouth daily.   dextromethorphan-guaiFENesin 30-600 MG 12hr tablet Commonly known as:  MUCINEX DM Take 1 tablet by mouth 2 (two) times daily as needed for cough.   dicyclomine 10 MG capsule Commonly known as:  BENTYL Take 1 capsule (10 mg total) by mouth 3 (three) times daily as needed for spasms (bowel spasms). What changed:  when to take this   ipratropium-albuterol 0.5-2.5 (3) MG/3ML Soln Commonly known as:  DUONEB Take 3 mLs by nebulization.   iron polysaccharides 150 MG capsule Commonly known as:  NIFEREX Take 1 capsule (150 mg total) by mouth daily. Start taking on:  04/26/2018   mometasone-formoterol 200-5 MCG/ACT Aero Commonly known as:  DULERA Inhale 2 puffs into the lungs 2 (two) times  daily.   pantoprazole 40 MG tablet Commonly known as:  PROTONIX Take 1 tablet (40 mg total) by mouth 2 (two) times daily before a meal. After 1 month take 1 daily in the morning What changed:  when to take this   predniSONE 20 MG tablet Commonly known as:  DELTASONE Take 2 tablets (40 mg total) by mouth daily with breakfast for 4 days. Start taking on:  04/26/2018   propranolol 40 MG tablet Commonly known as:  INDERAL Take 1 tablet (40 mg total) by mouth 2 (two) times daily.   rOPINIRole 2 MG tablet Commonly known as:  REQUIP Take 2 mg by mouth at bedtime.   sertraline 100 MG tablet Commonly known as:  ZOLOFT Take 1 tablet (100 mg total) by mouth at bedtime.   sucralfate 1 GM/10ML suspension Commonly known as:  CARAFATE Take 10 mLs (1 g total) by mouth 4 (four) times daily -  with meals and at bedtime.   tiotropium 18 MCG inhalation capsule Commonly known as:  SPIRIVA Place 1 capsule  (18 mcg total) into inhaler and inhale daily.   traMADol-acetaminophen 37.5-325 MG tablet Commonly known as:  ULTRACET Take 1 tablet by mouth every 6 (six) hours as needed for up to 5 days for moderate pain.            Durable Medical Equipment  (From admission, onward)         Start     Ordered   04/25/18 1524  For home use only DME oxygen  Once    Question Answer Comment  Mode or (Route) Nasal cannula   Liters per Minute 2   Frequency Continuous (stationary and portable oxygen unit needed)   Oxygen delivery system Gas      04/25/18 1523         Follow-up Information    Heywood Bene, PA-C. Schedule an appointment as soon as possible for a visit in 1 week(s).   Specialty:  Physician Assistant Why:  Hospital follow up  Contact information: [address] [place] [address] [phone]        Doran Stabler, MD. Schedule an appointment as soon as possible for a visit in 4 week(s).   Specialty:  Gastroenterology Why:  Hospital folllow up  Contact information: [place] [address] [phone]          Allergies  Allergen Reactions  . Zosyn [Piperacillin Sod-Tazobactam So] Swelling and Rash    Consultations:  None   Procedures/Studies: Dg Chest 2 View  Result Date: 04/23/2018 CLINICAL DATA:  Anemia and weakness. EXAM: CHEST - 2 VIEW COMPARISON:  10/06/2017 FINDINGS: Mild cardiac enlargement. Pulmonary vascularity is normal. Diffuse interstitial pattern to the lungs is unchanged since previous study, likely representing chronic fibrosis. No blunting of costophrenic angles. No pneumothorax. Mediastinal contours appear intact. Calcification of the aorta. IMPRESSION: Cardiac enlargement. 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 No evidence of active pulmonary disease. Electronically Signed   By: Lucienne Capers M.D.   On: 04/23/2018 00:54   Mr Brain Wo Contrast  Result Date: 04/23/2018 CLINICAL DATA:   Headache. History of GI bleeding. History of bladder cancer and renal cancer. EXAM: MRI HEAD WITHOUT CONTRAST TECHNIQUE: Multiplanar, multiecho pulse sequences of the brain and surrounding structures were obtained without intravenous contrast. COMPARISON:  None. FINDINGS: Brain: Image quality degraded by motion Generalized atrophy. Negative for acute infarct. Mild chronic white matter ischemia. Diffuse abnormality of the intracranial circulation  including arteries and veins. There is T1 shortening of the vessels which are markedly low signal on gradient echo imaging. Findings compatible with intravenous iron therapy for chronic anemia. Vascular: Negative for hyperdense vessel. Skull and upper cervical spine: Negative Sinuses/Orbits: Negative Other: None IMPRESSION: No acute intracranial abnormality Diffuse vascular abnormality compatible with intravenous iron infusion therapy for chronic anemia. Electronically Signed   By: Franchot Gallo M.D.   On: 04/23/2018 14:33     Discharge Exam: Vitals:   04/25/18 1335 04/25/18 1415  BP:  (!) 133/56  Pulse:  75  Resp:  18  Temp:  98.9 F (37.2 C)  SpO2: 90% 93%   Vitals:   04/25/18 0520 04/25/18 0829 04/25/18 1335 04/25/18 1415  BP: (!) 158/85   (!) 133/56  Pulse: 83   75  Resp: (!) 26   18  Temp: 98.9 F (37.2 C)   98.9 F (37.2 C)  TempSrc: Oral   Oral  SpO2: 92% 90% 90% 93%  Weight:      Height:        General: NAD Cardiovascular: RRR, S1/S2 +, no rubs, no gallops Respiratory: Good air entry, 2L [place], no wheezing or rales  Abdominal: Soft, NT, ND, bowel sounds + Extremities: no edema  The results of significant diagnostics from this hospitalization (including imaging, microbiology, ancillary and laboratory) are listed below for reference.     Microbiology: No results found for this or any previous visit (from the past 240 hour(s)).   Labs: BNP (last 3 results) No results for input(s): BNP in the last 8760 hours. Basic Metabolic  Panel: Recent Labs  Lab 04/22/18 2243 04/23/18 0912 04/24/18 0543 04/25/18 0540  NA 143 142 141 140  K 2.5* 3.3* 3.4* 3.4*  CL 109 109 106 102  CO2 25 26 25 29   GLUCOSE 85 94 123* 99  BUN 14 10 6* 11  CREATININE 0.90 0.69 0.68 0.74  CALCIUM 8.4* 8.4* 9.0 9.4  MG  --   --  2.0 1.8   Liver Function Tests: No results for input(s): AST, ALT, ALKPHOS, BILITOT, PROT, ALBUMIN in the last 168 hours. No results for input(s): LIPASE, AMYLASE in the last 168 hours. No results for input(s): AMMONIA in the last 168 hours. CBC: Recent Labs  Lab 04/22/18 2243 04/23/18 0912 04/24/18 0543 04/25/18 0540  WBC 6.5 5.8 5.5 14.6*  NEUTROABS  --   --   --  12.3*  HGB 6.9* 8.9* 8.8* 9.6*  HCT 24.8* 30.5* 30.1* 33.0*  MCV 71.3* 76.3* 75.4* 76.4*  PLT 307 260 244 322   Cardiac Enzymes: No results for input(s): CKTOTAL, CKMB, CKMBINDEX, TROPONINI in the last 168 hours. BNP: Invalid input(s): POCBNP CBG: No results for input(s): GLUCAP in the last 168 hours. D-Dimer No results for input(s): DDIMER in the last 72 hours. Hgb A1c No results for input(s): HGBA1C in the last 72 hours. Lipid Profile No results for input(s): CHOL, HDL, LDLCALC, TRIG, CHOLHDL, LDLDIRECT in the last 72 hours. Thyroid function studies No results for input(s): TSH, T4TOTAL, T3FREE, THYROIDAB in the last 72 hours.  Invalid input(s): FREET3 Anemia work up Recent Labs    04/23/18 0041  VITAMINB12 158*  FOLATE 8.8  FERRITIN 8*  TIBC 439  IRON 9*  RETICCTPCT 1.5   Urinalysis    Component Value Date/Time   COLORURINE STRAW (A) 04/25/2018 1230   APPEARANCEUR CLEAR 04/25/2018 1230   LABSPEC 1.009 04/25/2018 1230   PHURINE 7.0 04/25/2018 1230   GLUCOSEU NEGATIVE  04/25/2018 1230   HGBUR SMALL (A) 04/25/2018 1230   BILIRUBINUR NEGATIVE 04/25/2018 1230   KETONESUR NEGATIVE 04/25/2018 1230   PROTEINUR NEGATIVE 04/25/2018 1230   NITRITE NEGATIVE 04/25/2018 1230   LEUKOCYTESUR NEGATIVE 04/25/2018 1230   Sepsis  Labs Invalid input(s): PROCALCITONIN,  WBC,  LACTICIDVEN Microbiology No results found for this or any previous visit (from the past 240 hour(s)).   Time coordinating discharge: 32 minutes  SIGNED:  Chipper Oman, MD  [facility] 04/25/2018, 3:41 PM  Pager please text page via  [email or web address]  Note - This record has been created using [employer]. Chart creation errors have been sought, but may not always have been located. Such creation errors do not reflect on the standard of medical care.

## 2018-04-25 NOTE — Care Management Important Message (Signed)
Important Message  Patient Details  Name: Erica York MRN: [id number] Date of Birth: [birth year]   Medicare Important Message Given:  Yes    Erenest Rasher, RN 04/25/2018, 3:58 PM

## 2018-06-06 ENCOUNTER — Emergency Department (HOSPITAL_COMMUNITY): Payer: Medicare Other

## 2018-06-06 ENCOUNTER — Observation Stay (HOSPITAL_COMMUNITY)
Admission: EM | Admit: 2018-06-06 | Discharge: 2018-06-08 | Disposition: A | Discharge status: Home / Self Care | Payer: Medicare Other | Attending: Internal Medicine | Admitting: Internal Medicine

## 2018-06-06 DIAGNOSIS — Z789 Other specified health status: Secondary | ICD-10-CM | POA: Diagnosis present

## 2018-06-06 DIAGNOSIS — J449 Chronic obstructive pulmonary disease, unspecified: Secondary | ICD-10-CM | POA: Diagnosis not present

## 2018-06-06 DIAGNOSIS — R2681 Unsteadiness on feet: Secondary | ICD-10-CM | POA: Insufficient documentation

## 2018-06-06 DIAGNOSIS — R55 Syncope and collapse: Secondary | ICD-10-CM | POA: Diagnosis present

## 2018-06-06 DIAGNOSIS — N281 Cyst of kidney, acquired: Secondary | ICD-10-CM | POA: Diagnosis not present

## 2018-06-06 DIAGNOSIS — Z9049 Acquired absence of other specified parts of digestive tract: Secondary | ICD-10-CM | POA: Diagnosis not present

## 2018-06-06 DIAGNOSIS — E876 Hypokalemia: Secondary | ICD-10-CM | POA: Insufficient documentation

## 2018-06-06 DIAGNOSIS — M4856XA Collapsed vertebra, not elsewhere classified, lumbar region, initial encounter for fracture: Secondary | ICD-10-CM | POA: Diagnosis not present

## 2018-06-06 DIAGNOSIS — F17210 Nicotine dependence, cigarettes, uncomplicated: Secondary | ICD-10-CM | POA: Insufficient documentation

## 2018-06-06 DIAGNOSIS — Z66 Do not resuscitate: Secondary | ICD-10-CM | POA: Insufficient documentation

## 2018-06-06 DIAGNOSIS — K219 Gastro-esophageal reflux disease without esophagitis: Secondary | ICD-10-CM | POA: Diagnosis not present

## 2018-06-06 DIAGNOSIS — Z8719 Personal history of other diseases of the digestive system: Secondary | ICD-10-CM | POA: Insufficient documentation

## 2018-06-06 DIAGNOSIS — G2581 Restless legs syndrome: Secondary | ICD-10-CM | POA: Diagnosis not present

## 2018-06-06 DIAGNOSIS — Z880 Allergy status to penicillin: Secondary | ICD-10-CM | POA: Insufficient documentation

## 2018-06-06 DIAGNOSIS — E785 Hyperlipidemia, unspecified: Secondary | ICD-10-CM | POA: Diagnosis not present

## 2018-06-06 DIAGNOSIS — Z9981 Dependence on supplemental oxygen: Secondary | ICD-10-CM | POA: Insufficient documentation

## 2018-06-06 DIAGNOSIS — Z8711 Personal history of peptic ulcer disease: Secondary | ICD-10-CM | POA: Diagnosis not present

## 2018-06-06 DIAGNOSIS — I700 Atherosclerosis of aorta: Secondary | ICD-10-CM | POA: Insufficient documentation

## 2018-06-06 DIAGNOSIS — F419 Anxiety disorder, unspecified: Secondary | ICD-10-CM | POA: Insufficient documentation

## 2018-06-06 DIAGNOSIS — Z79899 Other long term (current) drug therapy: Secondary | ICD-10-CM | POA: Diagnosis not present

## 2018-06-06 DIAGNOSIS — M4854XA Collapsed vertebra, not elsewhere classified, thoracic region, initial encounter for fracture: Secondary | ICD-10-CM | POA: Diagnosis not present

## 2018-06-06 DIAGNOSIS — Z85528 Personal history of other malignant neoplasm of kidney: Secondary | ICD-10-CM | POA: Diagnosis not present

## 2018-06-06 DIAGNOSIS — W19XXXA Unspecified fall, initial encounter: Secondary | ICD-10-CM

## 2018-06-06 DIAGNOSIS — I714 Abdominal aortic aneurysm, without rupture: Secondary | ICD-10-CM | POA: Diagnosis not present

## 2018-06-06 DIAGNOSIS — M419 Scoliosis, unspecified: Secondary | ICD-10-CM | POA: Diagnosis not present

## 2018-06-06 DIAGNOSIS — I2510 Atherosclerotic heart disease of native coronary artery without angina pectoris: Secondary | ICD-10-CM | POA: Insufficient documentation

## 2018-06-06 DIAGNOSIS — D509 Iron deficiency anemia, unspecified: Secondary | ICD-10-CM | POA: Insufficient documentation

## 2018-06-06 DIAGNOSIS — Z905 Acquired absence of kidney: Secondary | ICD-10-CM | POA: Diagnosis not present

## 2018-06-06 DIAGNOSIS — Z8551 Personal history of malignant neoplasm of bladder: Secondary | ICD-10-CM | POA: Diagnosis not present

## 2018-06-06 DIAGNOSIS — F329 Major depressive disorder, single episode, unspecified: Secondary | ICD-10-CM | POA: Diagnosis not present

## 2018-06-06 DIAGNOSIS — D649 Anemia, unspecified: Secondary | ICD-10-CM | POA: Diagnosis present

## 2018-06-06 LAB — BASIC METABOLIC PANEL
ANION GAP: 7 (ref 5–15)
BUN: 14 mg/dL (ref 8–23)
CO2: 25 mmol/L (ref 22–32)
Calcium: 9.5 mg/dL (ref 8.9–10.3)
Chloride: 110 mmol/L (ref 98–111)
Creatinine, Ser: 1.25 mg/dL — ABNORMAL HIGH (ref 0.44–1.00)
GFR, EST AFRICAN AMERICAN: 48 mL/min — AB (ref >60)
GFR, EST NON AFRICAN AMERICAN: 41 mL/min — AB (ref >60)
Glucose, Bld: 112 mg/dL — ABNORMAL HIGH (ref 70–99)
POTASSIUM: 4.9 mmol/L (ref 3.5–5.1)
SODIUM: 142 mmol/L (ref 135–145)

## 2018-06-06 LAB — HEPATIC FUNCTION PANEL
ALBUMIN: 4.1 g/dL (ref 3.5–5.0)
ALK PHOS: 124 U/L (ref 38–126)
ALT: 8 U/L (ref 0–44)
AST: 14 U/L — ABNORMAL LOW (ref 15–41)
Bilirubin, Direct: <0.1 mg/dL (ref 0.0–0.2)
TOTAL PROTEIN: 7.3 g/dL (ref 6.5–8.1)
Total Bilirubin: 0.5 mg/dL (ref 0.3–1.2)

## 2018-06-06 LAB — I-STAT CHEM 8, ED
BUN: 21 mg/dL (ref 8–23)
Calcium, Ion: 1.21 mmol/L (ref 1.15–1.40)
Chloride: 109 mmol/L (ref 98–111)
Creatinine, Ser: 1.3 mg/dL — ABNORMAL HIGH (ref 0.44–1.00)
Glucose, Bld: 109 mg/dL — ABNORMAL HIGH (ref 70–99)
HEMATOCRIT: 38 % (ref 36.0–46.0)
HEMOGLOBIN: 12.9 g/dL (ref 12.0–15.0)
POTASSIUM: 5.3 mmol/L — AB (ref 3.5–5.1)
SODIUM: 141 mmol/L (ref 135–145)
TCO2: 29 mmol/L (ref 22–32)

## 2018-06-06 LAB — I-STAT TROPONIN, ED: TROPONIN I, POC: 0 ng/mL (ref 0.00–0.08)

## 2018-06-06 LAB — CBC
HCT: 40.4 % (ref 36.0–46.0)
HEMOGLOBIN: 11.4 g/dL — AB (ref 12.0–15.0)
MCH: 25.5 pg — ABNORMAL LOW (ref 26.0–34.0)
MCHC: 28.2 g/dL — ABNORMAL LOW (ref 30.0–36.0)
MCV: 90.4 fL (ref 80.0–100.0)
NRBC: 0 % (ref 0.0–0.2)
Platelets: 256 10*3/uL (ref 150–400)
RBC: 4.47 MIL/uL (ref 3.87–5.11)
RDW: 25.3 % — ABNORMAL HIGH (ref 11.5–15.5)
WBC: 7.1 10*3/uL (ref 4.0–10.5)

## 2018-06-06 LAB — I-STAT VENOUS BLOOD GAS, ED
ACID-BASE DEFICIT: 2 mmol/L (ref 0.0–2.0)
Bicarbonate: 26.1 mmol/L (ref 20.0–28.0)
O2 SAT: 34 %
PH VEN: 7.243 — AB (ref 7.250–7.430)
PO2 VEN: 25 mmHg — AB (ref 32.0–45.0)
TCO2: 28 mmol/L (ref 22–32)
pCO2, Ven: 60.4 mmHg — ABNORMAL HIGH (ref 44.0–60.0)

## 2018-06-06 LAB — CK: CK TOTAL: 50 U/L (ref 38–234)

## 2018-06-06 LAB — CBG MONITORING, ED: GLUCOSE-CAPILLARY: 96 mg/dL (ref 70–99)

## 2018-06-06 LAB — I-STAT CG4 LACTIC ACID, ED: LACTIC ACID, VENOUS: 1.11 mmol/L (ref 0.5–1.9)

## 2018-06-06 MED ORDER — LACTATED RINGERS IV BOLUS
1000.0000 mL | Freq: Once | INTRAVENOUS | Status: AC
  Administered 2018-06-06: 1000 mL via INTRAVENOUS

## 2018-06-06 MED ORDER — METHYLPREDNISOLONE SODIUM SUCC 125 MG IJ SOLR
125.0000 mg | Freq: Once | INTRAMUSCULAR | Status: AC
  Administered 2018-06-06: 125 mg via INTRAVENOUS
  Filled 2018-06-06: qty 2

## 2018-06-06 MED ORDER — ESMOLOL HCL-SODIUM CHLORIDE 2000 MG/100ML IV SOLN
25.0000 ug/kg/min | Freq: Once | INTRAVENOUS | Status: DC
  Filled 2018-06-06: qty 100

## 2018-06-06 MED ORDER — IPRATROPIUM-ALBUTEROL 0.5-2.5 (3) MG/3ML IN SOLN
3.0000 mL | Freq: Once | RESPIRATORY_TRACT | Status: AC
  Administered 2018-06-06: 3 mL via RESPIRATORY_TRACT
  Filled 2018-06-06: qty 3

## 2018-06-06 NOTE — ED Notes (Signed)
Patient transported to CT 

## 2018-06-06 NOTE — ED Triage Notes (Signed)
Pt comes via [facility] from home, had a fall on Monday, hematoma to back, multiple syncopal episode over the past week, with movement pt vomits and has syncopal episode, not on blood thinners, hx of abd mesh. BP in R arm 158/88 in L 132/70

## 2018-06-06 NOTE — ED Provider Notes (Signed)
[facility]PART[facility] Provider Note   CSN: [id number] Arrival date & time: 06/06/18  2021     History   Chief Complaint Chief Complaint  Patient presents with  . Loss of Consciousness    HPI Erica York is a 77 y.o. female.  HPI   Patient is a 77 year old female with PMHx of chronic iron deficiency anemia, recurrent upper GI bleed with duodenal ulcer/erosive esophagitis, COPD, bladder cancer status post TURBT in July, and renal cancer status post left nephrectomy who presents with multiple syncopal episodes at home since Monday.   Syncopal episodes occur while sitting, standing or walking.  Episodes are brief.  Patient also reported fall on Monday with complaints of right middle back pain.  She c/o associated nausea and NBNB emesis however denies HA, vision changes, isolated weakness, numbness, abdominal pain, and CP.  She notes prior history of black stools but none recently.  She denies hematemesis, hematuria, and BRBPR. Patient concerned today as she experienced 9 syncopal episodes.   Past Medical History:  Diagnosis Date  . Anemia   . Arthritis    hands  . Bladder cancer ([place])   . Chronic diarrhea   . Chronic gastritis   . COPD (chronic obstructive pulmonary disease) ([place])    per pt uses neb. as needed  . Depression   . Diverticulosis of colon   . Duodenal ulcer 08/01/2017   multiple per EGD  . GAD (generalized anxiety disorder)   . GERD (gastroesophageal reflux disease)   . Hiatal hernia   . History of bladder cancer 2017   s/p  TURBT's twice   . History of colonic diverticulitis   . History of GI bleed 08/01/2017   and 10-06-2017 upper GI bleed due to multiple duonenal ulcer , duodenitis , gastritis (transfused 10-06-2017)  . History of renal cell carcinoma    dx 2016---  s/p  left nephrectomy  . Hyperlipidemia   . Osteoporosis   . RLS (restless legs syndrome)   . 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 Urge  incontinence of urine     Patient Active Problem List   Diagnosis Date Noted  . Syncope 06/06/2018  . Tobacco abuse   . History of bladder cancer 04/23/2018  . History of renal cell cancer 04/23/2018  . Anemia 04/23/2018  . GI bleed 04/23/2018  . Acute blood loss anemia   . Hypokalemia   . COPD with acute exacerbation ([place])   . Chronic nonintractable headache   . Symptomatic anemia 10/07/2017  . Duodenal ulcer 10/07/2017  . Malnutrition of moderate degree 10/07/2017  . Anemia due to GI blood loss 10/06/2017  . Protein-calorie malnutrition, severe 08/07/2017  . Duodenitis 08/03/2017  . Blood loss anemia 08/03/2017    Past Surgical History:  Procedure Laterality Date  . ABDOMINAL HYSTERECTOMY  1973   w/ BSO and Appendectomy  . COLON SURGERY  2015   in [place]   "my colon was reconstructed"  . ESOPHAGOGASTRODUODENOSCOPY N/A 08/06/2017   Procedure: ESOPHAGOGASTRODUODENOSCOPY (EGD);  Surgeon: Clarene Essex, MD;  Location: [facility];  Service: Endoscopy;  Laterality: N/A;  . ESOPHAGOGASTRODUODENOSCOPY (EGD) WITH PROPOFOL Left 10/08/2017   Procedure: ESOPHAGOGASTRODUODENOSCOPY (EGD) WITH PROPOFOL;  Surgeon: Arta Silence, MD;  Location: [facility];  Service: Endoscopy;  Laterality: Left;  . HEMICOLECTOMY  x3  last one 2010   last one w/ ostomy (diverticulitis)  . INGUINAL HERNIA REPAIR Left 1980s  . LAPAROSCOPIC CHOLECYSTECTOMY  1990s  .  LEFT NEPHRECTOMY  2016   in [place]   renal cell carcinoma  . TRANSURETHRAL RESECTION OF BLADDER TUMOR  x2  2017  in [place], [place]  . TRANSURETHRAL RESECTION OF BLADDER TUMOR N/A 03/18/2018   Procedure: TRANSURETHRAL RESECTION OF BLADDER TUMOR (TURBT), WITH CYSTOSCOPY, CYSTOGRAM, INSTILATION OF GEMCITABINE;  Surgeon: Ceasar Mons, MD;  Location: [facility];  Service: Urology;  Laterality: N/A;     OB History   None      Home Medications    Prior to Admission medications   Medication Sig Start  Date End Date Taking? Authorizing Provider  albuterol (PROVENTIL HFA;VENTOLIN HFA) 108 (90 Base) MCG/ACT inhaler Inhale 2 puffs into the lungs every 6 (six) hours as needed for wheezing or shortness of breath. 04/25/18  Yes Patrecia Pour, Christean Grief, MD  ALPRAZolam Duanne Moron) 0.5 MG tablet Take 1 tablet (0.5 mg total) by mouth 2 (two) times daily as needed for anxiety. 04/25/18  Yes Doreatha Lew, MD  dicyclomine (BENTYL) 10 MG capsule Take 1 capsule (10 mg total) by mouth 3 (three) times daily as needed for spasms (bowel spasms). Patient taking differently: Take 10 mg by mouth 4 (four) times daily.  10/12/17  Yes Bonnielee Haff, MD  iron polysaccharides (NIFEREX) 150 MG capsule Take 1 capsule (150 mg total) by mouth daily. 04/26/18  Yes Patrecia Pour, Christean Grief, MD  mometasone-formoterol [facility]) 200-5 MCG/ACT AERO Inhale 2 puffs into the lungs 2 (two) times daily. 04/25/18  Yes Patrecia Pour, Christean Grief, MD  pantoprazole (PROTONIX) 40 MG tablet Take 1 tablet (40 mg total) by mouth 2 (two) times daily before a meal. After 1 month take 1 daily in the morning Patient taking differently: Take 40 mg by mouth daily.  04/25/18  Yes Patrecia Pour, Christean Grief, MD  propranolol (INDERAL) 40 MG tablet Take 1 tablet (40 mg total) by mouth 2 (two) times daily. 04/25/18  Yes Patrecia Pour, Christean Grief, MD  rOPINIRole (REQUIP) 2 MG tablet Take 2 mg by mouth at bedtime.   Yes [provider]  sertraline (ZOLOFT) 100 MG tablet Take 1 tablet (100 mg total) by mouth at bedtime. 08/08/17  Yes Gherghe, Vella Redhead, MD  dextromethorphan-guaiFENesin (MUCINEX DM) 30-600 MG 12hr tablet Take 1 tablet by mouth 2 (two) times daily as needed for cough. Patient not taking: Reported on 06/06/2018 04/25/18   Patrecia Pour, Christean Grief, MD  sucralfate (CARAFATE) 1 GM/10ML suspension Take 10 mLs (1 g total) by mouth 4 (four) times daily -  with meals and at bedtime. Patient not taking: Reported on 06/06/2018 04/25/18   Patrecia Pour, Christean Grief, MD  tiotropium (SPIRIVA HANDIHALER)  18 MCG inhalation capsule Place 1 capsule (18 mcg total) into inhaler and inhale daily. Patient not taking: Reported on 06/06/2018 04/25/18 04/25/19  Doreatha Lew, MD    Family History No family history on file.  Social History Social History   Tobacco Use  . Smoking status: Current Some Day Smoker    Years: 60.00    Types: Cigarettes  . Smokeless tobacco: Never Used  . Tobacco comment: 03-16-2018  per pt smokes on 2 wks off 2wks and when smokes 1/5ppd  Substance Use Topics  . Alcohol use: No    Frequency: Never  . Drug use: No     Allergies   Zosyn [piperacillin sod-tazobactam so]   Review of Systems Review of Systems  Constitutional: Positive for chills and fatigue. Negative for fever.  HENT: Negative for sore throat.   Eyes: Negative for pain  and visual disturbance.  Respiratory: Positive for cough (productive) and shortness of breath.   Cardiovascular: Negative for chest pain and palpitations.  Gastrointestinal: Positive for nausea and vomiting. Negative for abdominal pain, blood in stool and diarrhea.  Genitourinary: Negative for dysuria and hematuria.  Musculoskeletal: Negative for arthralgias and back pain.  Skin: Negative for color change, rash and wound.  Neurological: Positive for syncope. Negative for seizures and headaches.  All other systems reviewed and are negative.    Physical Exam Updated Vital Signs BP (!) 123/49   Pulse 78   Temp 98.6 F (37 C) (Rectal)   Resp 15   Wt 45 kg   SpO2 97%   BMI 22.24 kg/m   Physical Exam  Constitutional: She is oriented to person, place, and time. No distress.  Elderly chronically ill appearing female.  HENT:  Head: Normocephalic and atraumatic.  Dry mucous membranes. Oropharynx clear.  Eyes: Pupils are equal, round, and reactive to light. Conjunctivae and EOM are normal.  Neck: Normal range of motion. Neck supple.  Cardiovascular: Normal rate, regular rhythm and intact distal pulses.  Pulmonary/Chest:  No respiratory distress.  Requiring 2L [place].  Rhonchi throughout.  No gross wheezing.  Abdominal: Soft. There is no tenderness. There is no guarding.  Firm abdomen with meshlike structure palpated.    Musculoskeletal: Normal range of motion.  Neurological: She is alert and oriented to person, place, and time. She has normal strength. No cranial nerve deficit or sensory deficit. Coordination normal. GCS eye subscore is 4. GCS verbal subscore is 5. GCS motor subscore is 6.  Right sided mid back TTP.  Skin: Skin is warm and dry. Capillary refill takes less than 2 seconds. No rash noted.  No wounds or abrasions appreciated.  Psychiatric: She has a normal mood and affect.  Nursing note and vitals reviewed.    ED Treatments / Results  Labs (all labs ordered are listed, but only abnormal results are displayed) Labs Reviewed  BASIC METABOLIC PANEL - Abnormal; Notable for the following components:      Result Value   Glucose, Bld 112 (*)    Creatinine, Ser 1.25 (*)    GFR calc non Af Amer 41 (*)    GFR calc Af Amer 48 (*)    All other components within normal limits  CBC - Abnormal; Notable for the following components:   Hemoglobin 11.4 (*)    MCH 25.5 (*)    MCHC 28.2 (*)    RDW 25.3 (*)    All other components within normal limits  HEPATIC FUNCTION PANEL - Abnormal; Notable for the following components:   AST 14 (*)    All other components within normal limits  I-STAT CHEM 8, ED - Abnormal; Notable for the following components:   Potassium 5.3 (*)    Creatinine, Ser 1.30 (*)    Glucose, Bld 109 (*)    All other components within normal limits  I-STAT VENOUS BLOOD GAS, ED - Abnormal; Notable for the following components:   pH, Ven 7.243 (*)    pCO2, Ven 60.4 (*)    pO2, Ven 25.0 (*)    All other components within normal limits  URINE CULTURE  CK  URINALYSIS, ROUTINE W REFLEX MICROSCOPIC  BLOOD GAS, VENOUS  RAPID URINE DRUG SCREEN, HOSP PERFORMED  CBG MONITORING, ED  I-STAT  CG4 LACTIC ACID, ED  I-STAT TROPONIN, ED    EKG EKG Interpretation  Date/Time:  Saturday June 06 2018 21:12:29 EDT Ventricular Rate:  98 PR Interval:    QRS Duration: 90 QT Interval:  360 QTC Calculation: 460 R Axis:   63 Text Interpretation:  Sinus rhythm Baseline wander in lead(s) V2 V6 Confirmed by Pattricia Boss [phone]) on 06/06/2018 9:50:17 PM   Radiology Ct Abdomen Pelvis Wo Contrast  Result Date: 06/06/2018 CLINICAL DATA:  Recent fall. Syncopal episodes. Dyspnea. Abdominal distension. History of hemicolectomy, inguinal hernia repair, cholecystectomy, left nephrectomy for renal cell carcinoma and TURBT for bladder cancer. EXAM: CT CHEST, ABDOMEN AND PELVIS WITHOUT CONTRAST TECHNIQUE: Multidetector CT imaging of the chest, abdomen and pelvis was performed following the standard protocol without IV contrast. COMPARISON:  Chest radiograph from earlier today. 03/04/2018 CT abdomen/pelvis. FINDINGS: CT CHEST FINDINGS Cardiovascular: Normal heart size. No significant pericardial effusion/thickening. Left anterior descending coronary atherosclerosis. Atherosclerotic nonaneurysmal thoracic aorta. Normal caliber pulmonary arteries. Mediastinum/Nodes: No discrete thyroid nodules. Unremarkable esophagus. No pathologically enlarged axillary, mediastinal or hilar lymph nodes, noting limited sensitivity for the detection of hilar adenopathy on this noncontrast study. Lungs/Pleura: No pneumothorax. No pleural effusion. Severe centrilobular emphysema with diffuse bronchial wall thickening. Peripheral left upper lobe 4 mm solid pulmonary nodule (series 506/image 85). Mildly thickened parenchymal bands in the basilar right upper lobe and dependent lung bases compatible with mild postinfectious/postinflammatory scarring. No lung masses. No additional significant pulmonary nodules. No acute consolidative airspace disease. Musculoskeletal: No aggressive appearing focal osseous lesions. Moderate compression  fractures of the T5, T6, T7, T8 and T9 vertebral bodies, which appear chronic and stable since lateral 10/06/2017 chest radiograph. Moderate thoracic spondylosis. Long segment levocurvature of the thoracolumbar spine. CT ABDOMEN PELVIS FINDINGS Hepatobiliary: Normal liver size. Stable granulomatous liver dome calcification. No liver mass. Cholecystectomy. Bile ducts are stable and compatible with post cholecystectomy change with CBD diameter 12 mm. Pancreas: Normal, with no mass or duct dilation. Spleen: Normal size. No mass. Adrenals/Urinary Tract: No discrete adrenal nodules. Status post left nephrectomy. No mass or fluid collection in the left nephrectomy bed. No right hydronephrosis. No right renal stones. Simple 1.0 cm upper right renal cysts. No contour deforming right renal lesions. Stable curvilinear calcification in the midline anterior bladder wall. No acute bladder abnormality. Stomach/Bowel: Normal non-distended stomach. Normal caliber small bowel with no small bowel wall thickening. Apparent appendectomy. Subtotal left colectomy with distal colonic anastomosis. No large bowel wall thickening or acute pericolonic fat stranding. Vascular/Lymphatic: Atherosclerotic abdominal aorta with saccular 2.7 cm right abdominal aortic aneurysm (series 505/image 61), stable. No pathologically enlarged lymph nodes in the abdomen or pelvis. Reproductive: Status post hysterectomy, with no abnormal findings at the vaginal cuff. No adnexal mass. Other: No pneumoperitoneum, ascites or focal fluid collection. Ventral abdominal hernia repair mesh with no evidence of recurrent hernia. Musculoskeletal: No aggressive appearing focal osseous lesions. No acute fracture. Severe L4 vertebral compression fracture is chronic and stable since 03/04/2018 CT. Moderate lumbar spondylosis. Long segment levocurvature of the thoracolumbar spine. IMPRESSION: 1. No acute traumatic injury in the chest, abdomen or pelvis. Chronic thoracolumbar  vertebral compression fractures as detailed. 2. Severe centrilobular emphysema with diffuse bronchial wall thickening, suggesting COPD. 3. Solitary 4 mm solid left upper lobe pulmonary nodule. No follow-up needed if patient is low-risk. Non-contrast chest CT can be considered in 12 months if patient is high-risk. This recommendation follows the consensus statement: Guidelines for Management of Incidental Pulmonary Nodules Detected on CT Images:From the Fleischner Society 2017; published online before print (10.1148/[id number]). 4. One vessel coronary atherosclerosis. 5. No acute abnormality in the abdomen or pelvis. Stable postsurgical changes from colonic  resection, cholecystectomy, left nephrectomy and ventral hernia repair. No evidence of bowel obstruction or acute bowel inflammation. 6. Stable 2.7 cm saccular right abdominal aortic aneurysm. 7. Aortic Atherosclerosis (ICD10-I70.0) and Emphysema (ICD10-J43.9). Electronically Signed   By: Ilona Sorrel M.D.   On: 06/06/2018 23:02   Ct Head Wo Contrast  Result Date: 06/06/2018 CLINICAL DATA:  Multiple syncopal episodes. History of bladder cancer and renal cancer. EXAM: CT HEAD WITHOUT CONTRAST CT CERVICAL SPINE WITHOUT CONTRAST TECHNIQUE: Multidetector CT imaging of the head and cervical spine was performed following the standard protocol without intravenous contrast. Multiplanar CT image reconstructions of the cervical spine were also generated. COMPARISON:  None. FINDINGS: CT HEAD FINDINGS Brain: No evidence of acute infarction, hemorrhage, hydrocephalus, extra-axial collection or mass lesion/mass effect. Likely chronic minimal small vessel ischemic disease of periventricular white matter. Vascular: Atherosclerosis at the skull base. No hyperdense vessel sign. Skull: Negative for fracture or suspicious osseous lesions. Sinuses/Orbits: Intact orbits and globes. Paranasal sinuses are nonacute. Clear mastoids. Other: None CT CERVICAL SPINE FINDINGS  Alignment: Maintained cervical lordosis. Slight grade 1 retrolisthesis of C2 on C3. Minimal anterolisthesis of C4 on C5. Skull base and vertebrae: Intact skull base. No cervical spine fracture or suspicious osseous lesions. Soft tissues and spinal canal: No prevertebral soft tissue swelling. No visible canal hematoma. Disc levels: Moderate-to-marked degenerative disc disease C2-3 and from C4 through C7 with mild moderate disc flattening C3-4. Small posterior marginal osteophytes are noted at C4-5 and C5-6. No significant central foraminal stenosis. Mild bilateral facet arthropathy. Uncovertebral joint osteoarthritis is seen at C2-3, left C3-4, on the right at C4-5 and bilaterally at C5-6 and C6-7. Upper chest: Centrilobular emphysema. Aortic atherosclerosis. Extracranial carotid arteriosclerosis. Other: None IMPRESSION: No acute intracranial abnormality. Chronic appearing minimal small vessel ischemic disease. Cervical spondylosis with facet arthropathy. No acute cervical spine fracture or traumatic listhesis. Electronically Signed   By: Ashley Royalty M.D.   On: 06/06/2018 22:48   Ct Chest Wo Contrast  Result Date: 06/06/2018 CLINICAL DATA:  Recent fall. Syncopal episodes. Dyspnea. Abdominal distension. History of hemicolectomy, inguinal hernia repair, cholecystectomy, left nephrectomy for renal cell carcinoma and TURBT for bladder cancer. EXAM: CT CHEST, ABDOMEN AND PELVIS WITHOUT CONTRAST TECHNIQUE: Multidetector CT imaging of the chest, abdomen and pelvis was performed following the standard protocol without IV contrast. COMPARISON:  Chest radiograph from earlier today. 03/04/2018 CT abdomen/pelvis. FINDINGS: CT CHEST FINDINGS Cardiovascular: Normal heart size. No significant pericardial effusion/thickening. Left anterior descending coronary atherosclerosis. Atherosclerotic nonaneurysmal thoracic aorta. Normal caliber pulmonary arteries. Mediastinum/Nodes: No discrete thyroid nodules. Unremarkable esophagus.  No pathologically enlarged axillary, mediastinal or hilar lymph nodes, noting limited sensitivity for the detection of hilar adenopathy on this noncontrast study. Lungs/Pleura: No pneumothorax. No pleural effusion. Severe centrilobular emphysema with diffuse bronchial wall thickening. Peripheral left upper lobe 4 mm solid pulmonary nodule (series 506/image 85). Mildly thickened parenchymal bands in the basilar right upper lobe and dependent lung bases compatible with mild postinfectious/postinflammatory scarring. No lung masses. No additional significant pulmonary nodules. No acute consolidative airspace disease. Musculoskeletal: No aggressive appearing focal osseous lesions. Moderate compression fractures of the T5, T6, T7, T8 and T9 vertebral bodies, which appear chronic and stable since lateral 10/06/2017 chest radiograph. Moderate thoracic spondylosis. Long segment levocurvature of the thoracolumbar spine. CT ABDOMEN PELVIS FINDINGS Hepatobiliary: Normal liver size. Stable granulomatous liver dome calcification. No liver mass. Cholecystectomy. Bile ducts are stable and compatible with post cholecystectomy change with CBD diameter 12 mm. Pancreas: Normal, with no mass or duct dilation.  Spleen: Normal size. No mass. Adrenals/Urinary Tract: No discrete adrenal nodules. Status post left nephrectomy. No mass or fluid collection in the left nephrectomy bed. No right hydronephrosis. No right renal stones. Simple 1.0 cm upper right renal cysts. No contour deforming right renal lesions. Stable curvilinear calcification in the midline anterior bladder wall. No acute bladder abnormality. Stomach/Bowel: Normal non-distended stomach. Normal caliber small bowel with no small bowel wall thickening. Apparent appendectomy. Subtotal left colectomy with distal colonic anastomosis. No large bowel wall thickening or acute pericolonic fat stranding. Vascular/Lymphatic: Atherosclerotic abdominal aorta with saccular 2.7 cm right  abdominal aortic aneurysm (series 505/image 61), stable. No pathologically enlarged lymph nodes in the abdomen or pelvis. Reproductive: Status post hysterectomy, with no abnormal findings at the vaginal cuff. No adnexal mass. Other: No pneumoperitoneum, ascites or focal fluid collection. Ventral abdominal hernia repair mesh with no evidence of recurrent hernia. Musculoskeletal: No aggressive appearing focal osseous lesions. No acute fracture. Severe L4 vertebral compression fracture is chronic and stable since 03/04/2018 CT. Moderate lumbar spondylosis. Long segment levocurvature of the thoracolumbar spine. IMPRESSION: 1. No acute traumatic injury in the chest, abdomen or pelvis. Chronic thoracolumbar vertebral compression fractures as detailed. 2. Severe centrilobular emphysema with diffuse bronchial wall thickening, suggesting COPD. 3. Solitary 4 mm solid left upper lobe pulmonary nodule. No follow-up needed if patient is low-risk. Non-contrast chest CT can be considered in 12 months if patient is high-risk. This recommendation follows the consensus statement: Guidelines for Management of Incidental Pulmonary Nodules Detected on CT Images:From the Fleischner Society 2017; published online before print (10.1148/[id number]). 4. One vessel coronary atherosclerosis. 5. No acute abnormality in the abdomen or pelvis. Stable postsurgical changes from colonic resection, cholecystectomy, left nephrectomy and ventral hernia repair. No evidence of bowel obstruction or acute bowel inflammation. 6. Stable 2.7 cm saccular right abdominal aortic aneurysm. 7. Aortic Atherosclerosis (ICD10-I70.0) and Emphysema (ICD10-J43.9). Electronically Signed   By: Ilona Sorrel M.D.   On: 06/06/2018 23:02   Ct Cervical Spine Wo Contrast  Result Date: 06/06/2018 CLINICAL DATA:  Multiple syncopal episodes. History of bladder cancer and renal cancer. EXAM: CT HEAD WITHOUT CONTRAST CT CERVICAL SPINE WITHOUT CONTRAST TECHNIQUE:  Multidetector CT imaging of the head and cervical spine was performed following the standard protocol without intravenous contrast. Multiplanar CT image reconstructions of the cervical spine were also generated. COMPARISON:  None. FINDINGS: CT HEAD FINDINGS Brain: No evidence of acute infarction, hemorrhage, hydrocephalus, extra-axial collection or mass lesion/mass effect. Likely chronic minimal small vessel ischemic disease of periventricular white matter. Vascular: Atherosclerosis at the skull base. No hyperdense vessel sign. Skull: Negative for fracture or suspicious osseous lesions. Sinuses/Orbits: Intact orbits and globes. Paranasal sinuses are nonacute. Clear mastoids. Other: None CT CERVICAL SPINE FINDINGS Alignment: Maintained cervical lordosis. Slight grade 1 retrolisthesis of C2 on C3. Minimal anterolisthesis of C4 on C5. Skull base and vertebrae: Intact skull base. No cervical spine fracture or suspicious osseous lesions. Soft tissues and spinal canal: No prevertebral soft tissue swelling. No visible canal hematoma. Disc levels: Moderate-to-marked degenerative disc disease C2-3 and from C4 through C7 with mild moderate disc flattening C3-4. Small posterior marginal osteophytes are noted at C4-5 and C5-6. No significant central foraminal stenosis. Mild bilateral facet arthropathy. Uncovertebral joint osteoarthritis is seen at C2-3, left C3-4, on the right at C4-5 and bilaterally at C5-6 and C6-7. Upper chest: Centrilobular emphysema. Aortic atherosclerosis. Extracranial carotid arteriosclerosis. Other: None IMPRESSION: No acute intracranial abnormality. Chronic appearing minimal small vessel ischemic disease. Cervical spondylosis with facet arthropathy.  No acute cervical spine fracture or traumatic listhesis. Electronically Signed   By: Ashley Royalty M.D.   On: 06/06/2018 22:48   Ct T-spine No Charge  Result Date: 06/06/2018 CLINICAL DATA:  Post fall with hematoma to back. EXAM: CT THORACIC SPINE  WITHOUT CONTRAST TECHNIQUE: Multidetector CT images of the thoracic were obtained using the standard protocol without intravenous contrast. Performed in conjunction with CT of the lumbar spine and CT of the chest, abdomen, and pelvis. COMPARISON:  No prior chest are thoracic spine CT. Chest radiograph 10/06/2017, with limited spinal visualization. FINDINGS: Alignment: Moderate S-shaped scoliotic curvature of the thoracolumbar spine. Vertebrae: Loss of height at T5, T6, T7, T8, and T9. No paravertebral soft tissue thickening to suggest acute fracture. The posterior elements are intact. Paraspinal and other soft tissues: No anterior paravertebral soft tissue thickening. There is mild posterior paravertebral subcutaneous soft tissue density suggesting soft tissue contusion. Chest better assessed on full field of view chest CT. Disc levels: Mild diffuse disc space loss. Facet hypertrophy most prominent in the lower thoracic spine. IMPRESSION: 1. Compression fractures at T5 through T9, mild to moderate. No definite paravertebral edema to suggest acute fracture, however no prior exams are available for direct comparison. MRI could be obtained to evaluate for acuity based on clinical concern. 2. Posterior subcutaneous soft tissue contusion suspected. 3. Moderate scoliotic curvature with mild multilevel degenerative change. Electronically Signed   By: Keith Rake M.D.   On: 06/06/2018 23:08   Ct L-spine No Charge  Result Date: 06/06/2018 CLINICAL DATA:  Post fall with hematoma to back. EXAM: CT LUMBAR SPINE WITHOUT CONTRAST TECHNIQUE: Multidetector CT imaging of the lumbar spine was performed without intravenous contrast administration. Multiplanar CT image reconstructions were also generated. Performed in conjunction with CT of the thoracic spine, and CT of the chest, abdomen, and pelvis. COMPARISON:  Abdominopelvic CT 11/25/2017 FINDINGS: Segmentation: 5 lumbar type vertebrae. Alignment: Moderate levo scoliotic  curvature of the lower thoracic and upper lumbar spine. Minimal anterolisthesis of L2 on L3. Vertebrae: Chronic moderate to severe L4 compression fracture, unchanged from prior exam. Chronic loss of height of L2 with concave inferior endplate. No acute compression fracture. The posterior elements appear intact. No acute lumbar fracture. Paraspinal and other soft tissues: No confluent soft tissue hematoma. Abdominopelvic evaluation better assessed on dedicated abdominopelvic CT. Disc levels: Disc space narrowing and endplate spurring related scoliotic curvature most prominent at L1-L2. Disc space narrowing and endplate spurring at J2-I7. Multilevel facet arthropathy. IMPRESSION: 1. No acute fracture of the lumbar spine. 2. Remote moderate severe L4 compression fracture. Chronic mild loss of height of L2 with inferior endplate concavity. 3. Scoliosis and multilevel degenerative change. Electronically Signed   By: Keith Rake M.D.   On: 06/06/2018 23:00   Dg Chest Portable 1 View  Result Date: 06/06/2018 CLINICAL DATA:  Syncope. EXAM: PORTABLE CHEST 1 VIEW COMPARISON:  Radiographs of April 23, 2018. FINDINGS: Stable cardiomediastinal silhouette. Atherosclerosis thoracic aorta is noted. No pneumothorax or pleural effusion is noted. No acute pulmonary disease is noted. Bony thorax is unremarkable. IMPRESSION: No acute cardiopulmonary abnormality seen. Aortic Atherosclerosis (ICD10-I70.0). Electronically Signed   By: Marijo Conception, M.D.   On: 06/06/2018 21:05    Procedures Procedures (including critical care time)  Medications Ordered in ED Medications  lactated ringers bolus 1,000 mL (0 mLs Intravenous Stopped 06/07/18 0007)  ipratropium-albuterol (DUONEB) 0.5-2.5 (3) MG/3ML nebulizer solution 3 mL (3 mLs Nebulization Given 06/06/18 2200)  methylPREDNISolone sodium succinate (SOLU-MEDROL) 125 mg/2 mL  injection 125 mg (125 mg Intravenous Given 06/06/18 2243)     Initial Impression / Assessment  and Plan / ED Course  I have reviewed the triage vital signs and the nursing notes.  Pertinent labs & imaging results that were available during my care of the patient were reviewed by me and considered in my medical decision making (see chart for details).      Patient is a 77 year old female with PMHx of chronic iron deficiency anemia, recurrent upper GI bleed with duodenal ulcer/erosive esophagitis, COPD, bladder cancer status post TURBT in July, and renal CA status post left nephrectomy who presents with multiple syncopal episodes at home and falls since Monday (5 days ago).  No fevers, CP, worsening SOB, or abdominal pain.  On arrival she is normotensive, afebrile, with HR in the 90s.  She is requiring 2L [place] due saturations in the mid 80s.  Exam as above.    istat labs with Cr 1.30 and mild hyperkalemia of 5.3.  Will obtain non contrasted scans due to AKI with hx of nephrectomy as Cr 1.25 with baseline of 0.68.  IVF bolus given.  EKG shows NSR with a rate of 98 and no evidence of acute ischemic changes, abnormal intervals, or dysrhythmia.     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  EKG without acute ischemia and initial troponin negative with delta pending.  Patient also with dehydration given AKI however no acute electrolyte abnormalities or infectious process identified.  Lactic wnl and no leukocytosis.  No evidence of rhabdomyolysis as CK 50.  Patient also with COPD exacerbation therefore given steroids and duoneb.  Baseline VBG with respiratory acidosis as pH 7.24 and PCO2 60.   No acute traumatic injury found on trauma scans.  Case discussed with hospitalist who will  admit.  The plan for this patient was discussed with Dr. Jeanell Sparrow who voiced agreement and who oversaw evaluation and treatment of this patient.  Final Clinical Impressions(s) / ED Diagnoses   Final diagnoses:  Fall  Syncope, unspecified syncope type    ED Discharge Orders    None       Fabian November, MD 06/07/18 0131    Pattricia Boss, MD 06/07/18 [phone]

## 2018-06-07 ENCOUNTER — Observation Stay (HOSPITAL_BASED_OUTPATIENT_CLINIC_OR_DEPARTMENT_OTHER): Payer: Medicare Other

## 2018-06-07 ENCOUNTER — Encounter (HOSPITAL_COMMUNITY): Payer: Self-pay | Admitting: Internal Medicine

## 2018-06-07 ENCOUNTER — Other Ambulatory Visit: Payer: Self-pay

## 2018-06-07 DIAGNOSIS — I361 Nonrheumatic tricuspid (valve) insufficiency: Secondary | ICD-10-CM | POA: Diagnosis not present

## 2018-06-07 DIAGNOSIS — J449 Chronic obstructive pulmonary disease, unspecified: Secondary | ICD-10-CM

## 2018-06-07 DIAGNOSIS — R55 Syncope and collapse: Secondary | ICD-10-CM

## 2018-06-07 LAB — RAPID URINE DRUG SCREEN, HOSP PERFORMED
AMPHETAMINES: NOT DETECTED
BARBITURATES: NOT DETECTED
BENZODIAZEPINES: POSITIVE — AB
Cocaine: NOT DETECTED
Opiates: NOT DETECTED
Tetrahydrocannabinol: NOT DETECTED

## 2018-06-07 LAB — URINALYSIS, ROUTINE W REFLEX MICROSCOPIC
Bilirubin Urine: NEGATIVE
GLUCOSE, UA: NEGATIVE mg/dL
Ketones, ur: NEGATIVE mg/dL
NITRITE: NEGATIVE
PROTEIN: 30 mg/dL — AB
RBC / HPF: >50 RBC/hpf — ABNORMAL HIGH (ref 0–5)
Specific Gravity, Urine: 1.016 (ref 1.005–1.030)
pH: 5 (ref 5.0–8.0)

## 2018-06-07 LAB — COMPREHENSIVE METABOLIC PANEL
ALBUMIN: 3.3 g/dL — AB (ref 3.5–5.0)
ALT: 7 U/L (ref 0–44)
ANION GAP: 9 (ref 5–15)
AST: 15 U/L (ref 15–41)
Alkaline Phosphatase: 105 U/L (ref 38–126)
BUN: 14 mg/dL (ref 8–23)
CHLORIDE: 109 mmol/L (ref 98–111)
CO2: 22 mmol/L (ref 22–32)
Calcium: 9.3 mg/dL (ref 8.9–10.3)
Creatinine, Ser: 0.95 mg/dL (ref 0.44–1.00)
GFR calc non Af Amer: 57 mL/min — ABNORMAL LOW (ref >60)
Glucose, Bld: 136 mg/dL — ABNORMAL HIGH (ref 70–99)
Potassium: 5.1 mmol/L (ref 3.5–5.1)
SODIUM: 140 mmol/L (ref 135–145)
Total Bilirubin: 0.5 mg/dL (ref 0.3–1.2)
Total Protein: 6.3 g/dL — ABNORMAL LOW (ref 6.5–8.1)

## 2018-06-07 LAB — CBC
HEMATOCRIT: 35.9 % — AB (ref 36.0–46.0)
HEMOGLOBIN: 10.2 g/dL — AB (ref 12.0–15.0)
MCH: 24.9 pg — ABNORMAL LOW (ref 26.0–34.0)
MCHC: 28.4 g/dL — ABNORMAL LOW (ref 30.0–36.0)
MCV: 87.6 fL (ref 80.0–100.0)
NRBC: 0 % (ref 0.0–0.2)
PLATELETS: 228 10*3/uL (ref 150–400)
RBC: 4.1 MIL/uL (ref 3.87–5.11)
RDW: 24.8 % — ABNORMAL HIGH (ref 11.5–15.5)
WBC: 5.4 10*3/uL (ref 4.0–10.5)

## 2018-06-07 LAB — BLOOD GAS, ARTERIAL
Acid-base deficit: 0.3 mmol/L (ref 0.0–2.0)
Bicarbonate: 24.4 mmol/L (ref 20.0–28.0)
DRAWN BY: 365291
O2 Content: 2 L/min
O2 Saturation: 93.8 %
PH ART: 7.362 (ref 7.350–7.450)
Patient temperature: 98.6
pCO2 arterial: 44.1 mmHg (ref 32.0–48.0)
pO2, Arterial: 72.1 mmHg — ABNORMAL LOW (ref 83.0–108.0)

## 2018-06-07 LAB — TSH: TSH: 0.742 u[IU]/mL (ref 0.350–4.500)

## 2018-06-07 LAB — ECHOCARDIOGRAM COMPLETE: WEIGHTICAEL: 1587.31 [oz_av]

## 2018-06-07 LAB — TROPONIN I: Troponin I: <0.03 ng/mL (ref <0.03)

## 2018-06-07 MED ORDER — MOMETASONE FURO-FORMOTEROL FUM 200-5 MCG/ACT IN AERO
2.0000 | INHALATION_SPRAY | Freq: Two times a day (BID) | RESPIRATORY_TRACT | Status: DC
  Administered 2018-06-07 – 2018-06-08 (×3): 2 via RESPIRATORY_TRACT
  Filled 2018-06-07: qty 8.8

## 2018-06-07 MED ORDER — PROPRANOLOL HCL 40 MG PO TABS
40.0000 mg | ORAL_TABLET | Freq: Two times a day (BID) | ORAL | Status: DC
  Administered 2018-06-07 – 2018-06-08 (×3): 40 mg via ORAL
  Filled 2018-06-07 (×7): qty 1

## 2018-06-07 MED ORDER — ALBUTEROL SULFATE HFA 108 (90 BASE) MCG/ACT IN AERS: 2.0000 | INHALATION_SPRAY | Freq: Four times a day (QID) | RESPIRATORY_TRACT | Status: DC | PRN

## 2018-06-07 MED ORDER — POLYSACCHARIDE IRON COMPLEX 150 MG PO CAPS
150.0000 mg | ORAL_CAPSULE | Freq: Every day | ORAL | Status: DC
  Administered 2018-06-07 – 2018-06-08 (×2): 150 mg via ORAL
  Filled 2018-06-07 (×2): qty 1

## 2018-06-07 MED ORDER — ROPINIROLE HCL 1 MG PO TABS
2.0000 mg | ORAL_TABLET | Freq: Every day | ORAL | Status: DC
  Administered 2018-06-07 (×2): 2 mg via ORAL
  Filled 2018-06-07 (×2): qty 2

## 2018-06-07 MED ORDER — ACETAMINOPHEN 325 MG PO TABS: 650.0000 mg | ORAL_TABLET | Freq: Four times a day (QID) | ORAL | Status: DC | PRN

## 2018-06-07 MED ORDER — ALPRAZOLAM 0.5 MG PO TABS
0.5000 mg | ORAL_TABLET | Freq: Two times a day (BID) | ORAL | Status: DC | PRN
  Administered 2018-06-07: 0.5 mg via ORAL
  Filled 2018-06-07: qty 1

## 2018-06-07 MED ORDER — ACETAMINOPHEN 650 MG RE SUPP: 650.0000 mg | Freq: Four times a day (QID) | RECTAL | Status: DC | PRN

## 2018-06-07 MED ORDER — ENOXAPARIN SODIUM 40 MG/0.4ML ~~LOC~~ SOLN
40.0000 mg | SUBCUTANEOUS | Status: DC
  Administered 2018-06-07: 40 mg via SUBCUTANEOUS
  Filled 2018-06-07: qty 0.4

## 2018-06-07 MED ORDER — DICYCLOMINE HCL 10 MG PO CAPS
10.0000 mg | ORAL_CAPSULE | Freq: Four times a day (QID) | ORAL | Status: DC
  Administered 2018-06-07 (×3): 10 mg via ORAL
  Filled 2018-06-07 (×3): qty 1

## 2018-06-07 MED ORDER — ALBUTEROL SULFATE (2.5 MG/3ML) 0.083% IN NEBU
2.5000 mg | INHALATION_SOLUTION | Freq: Four times a day (QID) | RESPIRATORY_TRACT | Status: DC | PRN
  Administered 2018-06-08: 2.5 mg via RESPIRATORY_TRACT
  Filled 2018-06-07: qty 3

## 2018-06-07 MED ORDER — SERTRALINE HCL 100 MG PO TABS
100.0000 mg | ORAL_TABLET | Freq: Every day | ORAL | Status: DC
  Administered 2018-06-07 (×2): 100 mg via ORAL
  Filled 2018-06-07 (×2): qty 1

## 2018-06-07 MED ORDER — PANTOPRAZOLE SODIUM 40 MG PO TBEC
40.0000 mg | DELAYED_RELEASE_TABLET | Freq: Every day | ORAL | Status: DC
  Administered 2018-06-07 – 2018-06-08 (×2): 40 mg via ORAL
  Filled 2018-06-07 (×3): qty 1

## 2018-06-07 NOTE — H&P (Addendum)
TRH H&P   Patient Demographics:    Erica York, is a 77 y.o. female  MRN: [id number]   DOB - 1941/07/05  Admit Date - 06/06/2018  Outpatient Primary MD for the patient is Heywood Bene, PA-C  Referring MD/NP/PA:   Fabian November  Outpatient Specialists:     Patient coming from: home  Chief Complaint  Patient presents with  . Loss of Consciousness      HPI:    Erica York  is a 77 y.o. female, w Copd, PUD, anxiety,  RLS, apparently presents with c/o syncope since Monday.  Pt states that the episodes last a few seconds and no presyncopal symptoms. Pt had multiple episodes today and therefore presented to Ed.  Pt denies cp, palp, sob beyond her baseline, abd pain, diarrhea, brbpr, dysuria.    In ED,  T 98.6, P 78 Bp 12/49  Pox 97%   CT brain/ C spine IMPRESSION: No acute intracranial abnormality. Chronic appearing minimal small vessel ischemic disease.  Cervical spondylosis with facet arthropathy. No acute cervical spine fracture or traumatic listhesis.  CT chest/ abd / pelvis IMPRESSION: 1. No acute traumatic injury in the chest, abdomen or pelvis. Chronic thoracolumbar vertebral compression fractures as detailed. 2. Severe centrilobular emphysema with diffuse bronchial wall thickening, suggesting COPD. 3. Solitary 4 mm solid left upper lobe pulmonary nodule. No follow-up needed if patient is low-risk. Non-contrast chest CT can be considered in 12 months if patient is high-risk. This recommendation follows the consensus statement: Guidelines for Management of Incidental Pulmonary Nodules Detected on CT Images:From the Fleischner Society 2017; published online before print (10.1148/[id number]). 4. One vessel coronary atherosclerosis. 5. No acute abnormality in the abdomen or pelvis. Stable postsurgical changes from colonic resection,  cholecystectomy, left nephrectomy and ventral hernia repair. No evidence of bowel obstruction or acute bowel inflammation. 6. Stable 2.7 cm saccular right abdominal aortic aneurysm. 7. Aortic Atherosclerosis (ICD10-I70.0) and Emphysema (ICD10-J43.9).   CT T spine/ L Spine IMPRESSION: 1. Compression fractures at T5 through T9, mild to moderate. No definite paravertebral edema to suggest acute fracture, however no prior exams are available for direct comparison. MRI could be obtained to evaluate for acuity based on clinical concern. 2. Posterior subcutaneous soft tissue contusion suspected. 3. Moderate scoliotic curvature with mild multilevel degenerative change.  Na 142, K 4.9, Bun 14, Creatinien 1.25 Ast 14, Alt 8 Wbc 7.1, Hgb 11.4, Plt 256 CPK 50 Trop 0.00  Urinalysis pending  Pt will be admitted for syncope.        Review of systems:    In addition to the HPI above,    No Fever-chills, No Headache, No changes with Vision or hearing, No problems swallowing food or Liquids, No Chest pain, Cough or Shortness of Breath, No Abdominal pain, No Nausea or Vommitting, Bowel movements are regular, No Blood in stool or Urine, No dysuria, No new skin  rashes or bruises, No new joints pains-aches,  No new weakness, tingling, numbness in any extremity, No recent weight gain or loss, No polyuria, polydypsia or polyphagia, No significant Mental Stressors.  A full 10 point Review of Systems was done, except as stated above, all other Review of Systems were negative.   With Past History of the following :    Past Medical History:  Diagnosis Date  . Anemia   . Arthritis    hands  . Bladder cancer ([place])   . Chronic diarrhea   . Chronic gastritis   . COPD (chronic obstructive pulmonary disease) ([place])    per pt uses neb. as needed  . Depression   . Diverticulosis of colon   . Duodenal ulcer 08/01/2017   multiple per EGD  . GAD (generalized anxiety disorder)   . GERD  (gastroesophageal reflux disease)   . Hiatal hernia   . History of bladder cancer 2017   s/p  TURBT's twice   . History of colonic diverticulitis   . History of GI bleed 08/01/2017   and 10-06-2017 upper GI bleed due to multiple duonenal ulcer , duodenitis , gastritis (transfused 10-06-2017)  . History of renal cell carcinoma    dx 2016---  s/p  left nephrectomy  . Hyperlipidemia   . Osteoporosis   . RLS (restless legs syndrome)   . 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 Urge incontinence of urine       Past Surgical History:  Procedure Laterality Date  . ABDOMINAL HYSTERECTOMY  1973   w/ BSO and Appendectomy  . COLON SURGERY  2015   in [place]   "my colon was reconstructed"  . ESOPHAGOGASTRODUODENOSCOPY N/A 08/06/2017   Procedure: ESOPHAGOGASTRODUODENOSCOPY (EGD);  Surgeon: Clarene Essex, MD;  Location: [facility];  Service: Endoscopy;  Laterality: N/A;  . ESOPHAGOGASTRODUODENOSCOPY (EGD) WITH PROPOFOL Left 10/08/2017   Procedure: ESOPHAGOGASTRODUODENOSCOPY (EGD) WITH PROPOFOL;  Surgeon: Arta Silence, MD;  Location: [facility];  Service: Endoscopy;  Laterality: Left;  . HEMICOLECTOMY  x3  last one 2010   last one w/ ostomy (diverticulitis)  . INGUINAL HERNIA REPAIR Left 1980s  . LAPAROSCOPIC CHOLECYSTECTOMY  1990s  . LEFT NEPHRECTOMY  2016   in [place]   renal cell carcinoma  . TRANSURETHRAL RESECTION OF BLADDER TUMOR  x2  2017  in [place], [place]  . TRANSURETHRAL RESECTION OF BLADDER TUMOR N/A 03/18/2018   Procedure: TRANSURETHRAL RESECTION OF BLADDER TUMOR (TURBT), WITH CYSTOSCOPY, CYSTOGRAM, INSTILATION OF GEMCITABINE;  Surgeon: Ceasar Mons, MD;  Location: [facility];  Service: Urology;  Laterality: N/A;      Social History:     Social History   Tobacco Use  . Smoking status: Current Some Day Smoker    Years: 60.00    Types: Cigarettes  . Smokeless tobacco: Never Used  . Tobacco comment: 03-16-2018  per pt  smokes on 2 wks off 2wks and when smokes 1/5ppd  Substance Use Topics  . Alcohol use: No    Frequency: Never     Lives - at home  Mobility - walks by self   Family History :    History reviewed. No pertinent family history. Unknown per patient    Home Medications:   Prior to Admission medications   Medication Sig Start Date End Date Taking? Authorizing Provider  albuterol (PROVENTIL HFA;VENTOLIN HFA) 108 (90 Base) MCG/ACT inhaler Inhale 2 puffs into the lungs every 6 (six) hours as needed for  wheezing or shortness of breath. 04/25/18  Yes Patrecia Pour, Christean Grief, MD  ALPRAZolam Duanne Moron) 0.5 MG tablet Take 1 tablet (0.5 mg total) by mouth 2 (two) times daily as needed for anxiety. 04/25/18  Yes Doreatha Lew, MD  dicyclomine (BENTYL) 10 MG capsule Take 1 capsule (10 mg total) by mouth 3 (three) times daily as needed for spasms (bowel spasms). Patient taking differently: Take 10 mg by mouth 4 (four) times daily.  10/12/17  Yes Bonnielee Haff, MD  iron polysaccharides (NIFEREX) 150 MG capsule Take 1 capsule (150 mg total) by mouth daily. 04/26/18  Yes Patrecia Pour, Christean Grief, MD  mometasone-formoterol [facility]) 200-5 MCG/ACT AERO Inhale 2 puffs into the lungs 2 (two) times daily. 04/25/18  Yes Patrecia Pour, Christean Grief, MD  pantoprazole (PROTONIX) 40 MG tablet Take 1 tablet (40 mg total) by mouth 2 (two) times daily before a meal. After 1 month take 1 daily in the morning Patient taking differently: Take 40 mg by mouth daily.  04/25/18  Yes Patrecia Pour, Christean Grief, MD  propranolol (INDERAL) 40 MG tablet Take 1 tablet (40 mg total) by mouth 2 (two) times daily. 04/25/18  Yes Patrecia Pour, Christean Grief, MD  rOPINIRole (REQUIP) 2 MG tablet Take 2 mg by mouth at bedtime.   Yes [provider]  sertraline (ZOLOFT) 100 MG tablet Take 1 tablet (100 mg total) by mouth at bedtime. 08/08/17  Yes Gherghe, Vella Redhead, MD  dextromethorphan-guaiFENesin (MUCINEX DM) 30-600 MG 12hr tablet Take 1 tablet by mouth 2 (two) times  daily as needed for cough. Patient not taking: Reported on 06/06/2018 04/25/18   Patrecia Pour, Christean Grief, MD  sucralfate (CARAFATE) 1 GM/10ML suspension Take 10 mLs (1 g total) by mouth 4 (four) times daily -  with meals and at bedtime. Patient not taking: Reported on 06/06/2018 04/25/18   Patrecia Pour, Christean Grief, MD  tiotropium (SPIRIVA HANDIHALER) 18 MCG inhalation capsule Place 1 capsule (18 mcg total) into inhaler and inhale daily. Patient not taking: Reported on 06/06/2018 04/25/18 04/25/19  Doreatha Lew, MD     Allergies:     Allergies  Allergen Reactions  . Zosyn [Piperacillin Sod-Tazobactam So] Swelling and Rash     Physical Exam:   Vitals  Blood pressure (!) 123/49, pulse 78, temperature 98.6 F (37 C), temperature source Rectal, resp. rate 15, weight 45 kg, SpO2 97 %.   1. General  lying in bed in NAD,   2. Appears somnolent,    Alert, Oriented X 3.  3. No F.N deficits, ALL C.Nerves Intact, Strength 5/5 all 4 extremities, Sensation intact all 4 extremities, Plantars down going.  4. Ears and Eyes appear Normal, Conjunctivae clear, PERRLA. Moist Oral Mucosa.  5. Supple Neck, No JVD, No cervical lymphadenopathy appriciated, No Carotid Bruits.  6. Symmetrical Chest wall movement, Good air movement bilaterally, CTAB.  7. RRR, s1, s2, 1/6 sem rusb  8. Positive Bowel Sounds, Abdomen Soft, No tenderness, No organomegaly appriciated,No rebound -guarding or rigidity.  9.  No Cyanosis, Normal Skin Turgor, No Skin Rash or Bruise.  10. Good muscle tone,  joints appear normal , no effusions, Normal ROM.  11. No Palpable Lymph Nodes in Neck or Axillae    Data Review:    CBC Recent Labs  Lab 06/06/18 2031 06/06/18 2100  WBC 7.1  --   HGB 11.4* 12.9  HCT 40.4 38.0  PLT 256  --   MCV 90.4  --   MCH 25.5*  --   MCHC 28.2*  --  RDW 25.3*  --    ------------------------------------------------------------------------------------------------------------------  Chemistries   Recent Labs  Lab 06/06/18 2031 06/06/18 2100  NA 142 141  K 4.9 5.3*  CL 110 109  CO2 25  --   GLUCOSE 112* 109*  BUN 14 21  CREATININE 1.25* 1.30*  CALCIUM 9.5  --   AST 14*  --   ALT 8  --   ALKPHOS 124  --   BILITOT 0.5  --    ------------------------------------------------------------------------------------------------------------------ estimated creatinine clearance is 23.5 mL/min (A) (by C-G formula based on SCr of 1.3 mg/dL (H)). ------------------------------------------------------------------------------------------------------------------ No results for input(s): TSH, T4TOTAL, T3FREE, THYROIDAB in the last 72 hours.  Invalid input(s): FREET3  Coagulation profile No results for input(s): INR, PROTIME in the last 168 hours. ------------------------------------------------------------------------------------------------------------------- No results for input(s): DDIMER in the last 72 hours. -------------------------------------------------------------------------------------------------------------------  Cardiac Enzymes No results for input(s): CKMB, TROPONINI, MYOGLOBIN in the last 168 hours.  Invalid input(s): CK ------------------------------------------------------------------------------------------------------------------ No results found for: BNP   ---------------------------------------------------------------------------------------------------------------  Urinalysis    Component Value Date/Time   COLORURINE STRAW (A) 04/25/2018 1230   APPEARANCEUR CLEAR 04/25/2018 1230   LABSPEC 1.009 04/25/2018 1230   PHURINE 7.0 04/25/2018 1230   GLUCOSEU NEGATIVE 04/25/2018 1230   HGBUR SMALL (A) 04/25/2018 1230   BILIRUBINUR NEGATIVE 04/25/2018 1230   KETONESUR NEGATIVE 04/25/2018 1230   PROTEINUR NEGATIVE 04/25/2018 1230   NITRITE NEGATIVE 04/25/2018 1230   LEUKOCYTESUR NEGATIVE 04/25/2018 1230     ----------------------------------------------------------------------------------------------------------------   Imaging Results:    Ct Abdomen Pelvis Wo Contrast  Result Date: 06/06/2018 CLINICAL DATA:  Recent fall. Syncopal episodes. Dyspnea. Abdominal distension. History of hemicolectomy, inguinal hernia repair, cholecystectomy, left nephrectomy for renal cell carcinoma and TURBT for bladder cancer. EXAM: CT CHEST, ABDOMEN AND PELVIS WITHOUT CONTRAST TECHNIQUE: Multidetector CT imaging of the chest, abdomen and pelvis was performed following the standard protocol without IV contrast. COMPARISON:  Chest radiograph from earlier today. 03/04/2018 CT abdomen/pelvis. FINDINGS: CT CHEST FINDINGS Cardiovascular: Normal heart size. No significant pericardial effusion/thickening. Left anterior descending coronary atherosclerosis. Atherosclerotic nonaneurysmal thoracic aorta. Normal caliber pulmonary arteries. Mediastinum/Nodes: No discrete thyroid nodules. Unremarkable esophagus. No pathologically enlarged axillary, mediastinal or hilar lymph nodes, noting limited sensitivity for the detection of hilar adenopathy on this noncontrast study. Lungs/Pleura: No pneumothorax. No pleural effusion. Severe centrilobular emphysema with diffuse bronchial wall thickening. Peripheral left upper lobe 4 mm solid pulmonary nodule (series 506/image 85). Mildly thickened parenchymal bands in the basilar right upper lobe and dependent lung bases compatible with mild postinfectious/postinflammatory scarring. No lung masses. No additional significant pulmonary nodules. No acute consolidative airspace disease. Musculoskeletal: No aggressive appearing focal osseous lesions. Moderate compression fractures of the T5, T6, T7, T8 and T9 vertebral bodies, which appear chronic and stable since lateral 10/06/2017 chest radiograph. Moderate thoracic spondylosis. Long segment levocurvature of the thoracolumbar spine. CT ABDOMEN PELVIS  FINDINGS Hepatobiliary: Normal liver size. Stable granulomatous liver dome calcification. No liver mass. Cholecystectomy. Bile ducts are stable and compatible with post cholecystectomy change with CBD diameter 12 mm. Pancreas: Normal, with no mass or duct dilation. Spleen: Normal size. No mass. Adrenals/Urinary Tract: No discrete adrenal nodules. Status post left nephrectomy. No mass or fluid collection in the left nephrectomy bed. No right hydronephrosis. No right renal stones. Simple 1.0 cm upper right renal cysts. No contour deforming right renal lesions. Stable curvilinear calcification in the midline anterior bladder wall. No acute bladder abnormality. Stomach/Bowel: Normal non-distended stomach. Normal caliber small bowel with no small bowel wall thickening. Apparent  appendectomy. Subtotal left colectomy with distal colonic anastomosis. No large bowel wall thickening or acute pericolonic fat stranding. Vascular/Lymphatic: Atherosclerotic abdominal aorta with saccular 2.7 cm right abdominal aortic aneurysm (series 505/image 61), stable. No pathologically enlarged lymph nodes in the abdomen or pelvis. Reproductive: Status post hysterectomy, with no abnormal findings at the vaginal cuff. No adnexal mass. Other: No pneumoperitoneum, ascites or focal fluid collection. Ventral abdominal hernia repair mesh with no evidence of recurrent hernia. Musculoskeletal: No aggressive appearing focal osseous lesions. No acute fracture. Severe L4 vertebral compression fracture is chronic and stable since 03/04/2018 CT. Moderate lumbar spondylosis. Long segment levocurvature of the thoracolumbar spine. IMPRESSION: 1. No acute traumatic injury in the chest, abdomen or pelvis. Chronic thoracolumbar vertebral compression fractures as detailed. 2. Severe centrilobular emphysema with diffuse bronchial wall thickening, suggesting COPD. 3. Solitary 4 mm solid left upper lobe pulmonary nodule. No follow-up needed if patient is low-risk.  Non-contrast chest CT can be considered in 12 months if patient is high-risk. This recommendation follows the consensus statement: Guidelines for Management of Incidental Pulmonary Nodules Detected on CT Images:From the Fleischner Society 2017; published online before print (10.1148/radiol.[id number]). 4. One vessel coronary atherosclerosis. 5. No acute abnormality in the abdomen or pelvis. Stable postsurgical changes from colonic resection, cholecystectomy, left nephrectomy and ventral hernia repair. No evidence of bowel obstruction or acute bowel inflammation. 6. Stable 2.7 cm saccular right abdominal aortic aneurysm. 7. Aortic Atherosclerosis (ICD10-I70.0) and Emphysema (ICD10-J43.9). Electronically Signed   By: Ilona Sorrel M.D.   On: 06/06/2018 23:02   Ct Head Wo Contrast  Result Date: 06/06/2018 CLINICAL DATA:  Multiple syncopal episodes. History of bladder cancer and renal cancer. EXAM: CT HEAD WITHOUT CONTRAST CT CERVICAL SPINE WITHOUT CONTRAST TECHNIQUE: Multidetector CT imaging of the head and cervical spine was performed following the standard protocol without intravenous contrast. Multiplanar CT image reconstructions of the cervical spine were also generated. COMPARISON:  None. FINDINGS: CT HEAD FINDINGS Brain: No evidence of acute infarction, hemorrhage, hydrocephalus, extra-axial collection or mass lesion/mass effect. Likely chronic minimal small vessel ischemic disease of periventricular white matter. Vascular: Atherosclerosis at the skull base. No hyperdense vessel sign. Skull: Negative for fracture or suspicious osseous lesions. Sinuses/Orbits: Intact orbits and globes. Paranasal sinuses are nonacute. Clear mastoids. Other: None CT CERVICAL SPINE FINDINGS Alignment: Maintained cervical lordosis. Slight grade 1 retrolisthesis of C2 on C3. Minimal anterolisthesis of C4 on C5. Skull base and vertebrae: Intact skull base. No cervical spine fracture or suspicious osseous lesions. Soft tissues and  spinal canal: No prevertebral soft tissue swelling. No visible canal hematoma. Disc levels: Moderate-to-marked degenerative disc disease C2-3 and from C4 through C7 with mild moderate disc flattening C3-4. Small posterior marginal osteophytes are noted at C4-5 and C5-6. No significant central foraminal stenosis. Mild bilateral facet arthropathy. Uncovertebral joint osteoarthritis is seen at C2-3, left C3-4, on the right at C4-5 and bilaterally at C5-6 and C6-7. Upper chest: Centrilobular emphysema. Aortic atherosclerosis. Extracranial carotid arteriosclerosis. Other: None IMPRESSION: No acute intracranial abnormality. Chronic appearing minimal small vessel ischemic disease. Cervical spondylosis with facet arthropathy. No acute cervical spine fracture or traumatic listhesis. Electronically Signed   By: Ashley Royalty M.D.   On: 06/06/2018 22:48   Ct Chest Wo Contrast  Result Date: 06/06/2018 CLINICAL DATA:  Recent fall. Syncopal episodes. Dyspnea. Abdominal distension. History of hemicolectomy, inguinal hernia repair, cholecystectomy, left nephrectomy for renal cell carcinoma and TURBT for bladder cancer. EXAM: CT CHEST, ABDOMEN AND PELVIS WITHOUT CONTRAST TECHNIQUE: Multidetector CT imaging of the  chest, abdomen and pelvis was performed following the standard protocol without IV contrast. COMPARISON:  Chest radiograph from earlier today. 03/04/2018 CT abdomen/pelvis. FINDINGS: CT CHEST FINDINGS Cardiovascular: Normal heart size. No significant pericardial effusion/thickening. Left anterior descending coronary atherosclerosis. Atherosclerotic nonaneurysmal thoracic aorta. Normal caliber pulmonary arteries. Mediastinum/Nodes: No discrete thyroid nodules. Unremarkable esophagus. No pathologically enlarged axillary, mediastinal or hilar lymph nodes, noting limited sensitivity for the detection of hilar adenopathy on this noncontrast study. Lungs/Pleura: No pneumothorax. No pleural effusion. Severe centrilobular  emphysema with diffuse bronchial wall thickening. Peripheral left upper lobe 4 mm solid pulmonary nodule (series 506/image 85). Mildly thickened parenchymal bands in the basilar right upper lobe and dependent lung bases compatible with mild postinfectious/postinflammatory scarring. No lung masses. No additional significant pulmonary nodules. No acute consolidative airspace disease. Musculoskeletal: No aggressive appearing focal osseous lesions. Moderate compression fractures of the T5, T6, T7, T8 and T9 vertebral bodies, which appear chronic and stable since lateral 10/06/2017 chest radiograph. Moderate thoracic spondylosis. Long segment levocurvature of the thoracolumbar spine. CT ABDOMEN PELVIS FINDINGS Hepatobiliary: Normal liver size. Stable granulomatous liver dome calcification. No liver mass. Cholecystectomy. Bile ducts are stable and compatible with post cholecystectomy change with CBD diameter 12 mm. Pancreas: Normal, with no mass or duct dilation. Spleen: Normal size. No mass. Adrenals/Urinary Tract: No discrete adrenal nodules. Status post left nephrectomy. No mass or fluid collection in the left nephrectomy bed. No right hydronephrosis. No right renal stones. Simple 1.0 cm upper right renal cysts. No contour deforming right renal lesions. Stable curvilinear calcification in the midline anterior bladder wall. No acute bladder abnormality. Stomach/Bowel: Normal non-distended stomach. Normal caliber small bowel with no small bowel wall thickening. Apparent appendectomy. Subtotal left colectomy with distal colonic anastomosis. No large bowel wall thickening or acute pericolonic fat stranding. Vascular/Lymphatic: Atherosclerotic abdominal aorta with saccular 2.7 cm right abdominal aortic aneurysm (series 505/image 61), stable. No pathologically enlarged lymph nodes in the abdomen or pelvis. Reproductive: Status post hysterectomy, with no abnormal findings at the vaginal cuff. No adnexal mass. Other: No  pneumoperitoneum, ascites or focal fluid collection. Ventral abdominal hernia repair mesh with no evidence of recurrent hernia. Musculoskeletal: No aggressive appearing focal osseous lesions. No acute fracture. Severe L4 vertebral compression fracture is chronic and stable since 03/04/2018 CT. Moderate lumbar spondylosis. Long segment levocurvature of the thoracolumbar spine. IMPRESSION: 1. No acute traumatic injury in the chest, abdomen or pelvis. Chronic thoracolumbar vertebral compression fractures as detailed. 2. Severe centrilobular emphysema with diffuse bronchial wall thickening, suggesting COPD. 3. Solitary 4 mm solid left upper lobe pulmonary nodule. No follow-up needed if patient is low-risk. Non-contrast chest CT can be considered in 12 months if patient is high-risk. This recommendation follows the consensus statement: Guidelines for Management of Incidental Pulmonary Nodules Detected on CT Images:From the Fleischner Society 2017; published online before print (10.1148/[id number]). 4. One vessel coronary atherosclerosis. 5. No acute abnormality in the abdomen or pelvis. Stable postsurgical changes from colonic resection, cholecystectomy, left nephrectomy and ventral hernia repair. No evidence of bowel obstruction or acute bowel inflammation. 6. Stable 2.7 cm saccular right abdominal aortic aneurysm. 7. Aortic Atherosclerosis (ICD10-I70.0) and Emphysema (ICD10-J43.9). Electronically Signed   By: Ilona Sorrel M.D.   On: 06/06/2018 23:02   Ct Cervical Spine Wo Contrast  Result Date: 06/06/2018 CLINICAL DATA:  Multiple syncopal episodes. History of bladder cancer and renal cancer. EXAM: CT HEAD WITHOUT CONTRAST CT CERVICAL SPINE WITHOUT CONTRAST TECHNIQUE: Multidetector CT imaging of the head and cervical spine was performed following the  standard protocol without intravenous contrast. Multiplanar CT image reconstructions of the cervical spine were also generated. COMPARISON:  None. FINDINGS: CT  HEAD FINDINGS Brain: No evidence of acute infarction, hemorrhage, hydrocephalus, extra-axial collection or mass lesion/mass effect. Likely chronic minimal small vessel ischemic disease of periventricular white matter. Vascular: Atherosclerosis at the skull base. No hyperdense vessel sign. Skull: Negative for fracture or suspicious osseous lesions. Sinuses/Orbits: Intact orbits and globes. Paranasal sinuses are nonacute. Clear mastoids. Other: None CT CERVICAL SPINE FINDINGS Alignment: Maintained cervical lordosis. Slight grade 1 retrolisthesis of C2 on C3. Minimal anterolisthesis of C4 on C5. Skull base and vertebrae: Intact skull base. No cervical spine fracture or suspicious osseous lesions. Soft tissues and spinal canal: No prevertebral soft tissue swelling. No visible canal hematoma. Disc levels: Moderate-to-marked degenerative disc disease C2-3 and from C4 through C7 with mild moderate disc flattening C3-4. Small posterior marginal osteophytes are noted at C4-5 and C5-6. No significant central foraminal stenosis. Mild bilateral facet arthropathy. Uncovertebral joint osteoarthritis is seen at C2-3, left C3-4, on the right at C4-5 and bilaterally at C5-6 and C6-7. Upper chest: Centrilobular emphysema. Aortic atherosclerosis. Extracranial carotid arteriosclerosis. Other: None IMPRESSION: No acute intracranial abnormality. Chronic appearing minimal small vessel ischemic disease. Cervical spondylosis with facet arthropathy. No acute cervical spine fracture or traumatic listhesis. Electronically Signed   By: Ashley Royalty M.D.   On: 06/06/2018 22:48   Ct T-spine No Charge  Result Date: 06/06/2018 CLINICAL DATA:  Post fall with hematoma to back. EXAM: CT THORACIC SPINE WITHOUT CONTRAST TECHNIQUE: Multidetector CT images of the thoracic were obtained using the standard protocol without intravenous contrast. Performed in conjunction with CT of the lumbar spine and CT of the chest, abdomen, and pelvis. COMPARISON:  No  prior chest are thoracic spine CT. Chest radiograph 10/06/2017, with limited spinal visualization. FINDINGS: Alignment: Moderate S-shaped scoliotic curvature of the thoracolumbar spine. Vertebrae: Loss of height at T5, T6, T7, T8, and T9. No paravertebral soft tissue thickening to suggest acute fracture. The posterior elements are intact. Paraspinal and other soft tissues: No anterior paravertebral soft tissue thickening. There is mild posterior paravertebral subcutaneous soft tissue density suggesting soft tissue contusion. Chest better assessed on full field of view chest CT. Disc levels: Mild diffuse disc space loss. Facet hypertrophy most prominent in the lower thoracic spine. IMPRESSION: 1. Compression fractures at T5 through T9, mild to moderate. No definite paravertebral edema to suggest acute fracture, however no prior exams are available for direct comparison. MRI could be obtained to evaluate for acuity based on clinical concern. 2. Posterior subcutaneous soft tissue contusion suspected. 3. Moderate scoliotic curvature with mild multilevel degenerative change. Electronically Signed   By: Keith Rake M.D.   On: 06/06/2018 23:08   Ct L-spine No Charge  Result Date: 06/06/2018 CLINICAL DATA:  Post fall with hematoma to back. EXAM: CT LUMBAR SPINE WITHOUT CONTRAST TECHNIQUE: Multidetector CT imaging of the lumbar spine was performed without intravenous contrast administration. Multiplanar CT image reconstructions were also generated. Performed in conjunction with CT of the thoracic spine, and CT of the chest, abdomen, and pelvis. COMPARISON:  Abdominopelvic CT 11/25/2017 FINDINGS: Segmentation: 5 lumbar type vertebrae. Alignment: Moderate levo scoliotic curvature of the lower thoracic and upper lumbar spine. Minimal anterolisthesis of L2 on L3. Vertebrae: Chronic moderate to severe L4 compression fracture, unchanged from prior exam. Chronic loss of height of L2 with concave inferior endplate. No  acute compression fracture. The posterior elements appear intact. No acute lumbar fracture. Paraspinal and other  soft tissues: No confluent soft tissue hematoma. Abdominopelvic evaluation better assessed on dedicated abdominopelvic CT. Disc levels: Disc space narrowing and endplate spurring related scoliotic curvature most prominent at L1-L2. Disc space narrowing and endplate spurring at D4-Y8. Multilevel facet arthropathy. IMPRESSION: 1. No acute fracture of the lumbar spine. 2. Remote moderate severe L4 compression fracture. Chronic mild loss of height of L2 with inferior endplate concavity. 3. Scoliosis and multilevel degenerative change. Electronically Signed   By: Keith Rake M.D.   On: 06/06/2018 23:00   Dg Chest Portable 1 View  Result Date: 06/06/2018 CLINICAL DATA:  Syncope. EXAM: PORTABLE CHEST 1 VIEW COMPARISON:  Radiographs of April 23, 2018. FINDINGS: Stable cardiomediastinal silhouette. Atherosclerosis thoracic aorta is noted. No pneumothorax or pleural effusion is noted. No acute pulmonary disease is noted. Bony thorax is unremarkable. IMPRESSION: No acute cardiopulmonary abnormality seen. Aortic Atherosclerosis (ICD10-I70.0). Electronically Signed   By: Marijo Conception, M.D.   On: 06/06/2018 21:05     ekg nsr at 95, nl axis, poor R progression   Assessment & Plan:    Principal Problem:   Syncope Active Problems:   Anemia   COPD (chronic obstructive pulmonary disease) (HCC)    Syncope Tele Trop I q6h x3 Carotid ultrasound Cardiac echo Check ABG EEG May need sleep study as outpatient   Copd Cont Dulera Cont Albuterol Check ABG in am to see if Co2 retainer  Lung nodule Please make sure has outpatient follow up  Anxiety Cont zoloft Cont xanax prn  RLS Cont Ropinirole  PUD Cont PPI     DVT Prophylaxis Lovenox - SCDs  AM Labs Ordered, also please review Full Orders  Family Communication: Admission, patients condition and plan of care including  tests being ordered have been discussed with the patient  who indicate understanding and agree with the plan and Code Status.  Code Status FULL CODE  Likely DC to  home  Condition GUARDED    Consults called: none  Admission status: observation, pt will require hospitalization for evaluation of syncope (multiple), needs tele to r/o arrythmia, and further w/up,  Depending upon results of w/up might require inpatient status   Time spent in minutes : 55   Jani Gravel M.D on 06/07/2018 at 12:38 AM  Between 7am to 7pm - Pager - [phone]   After 7pm go to [email or web address] - password [facility]  [facility] - Office  [phone]

## 2018-06-07 NOTE — Progress Notes (Signed)
Admission follow-up note: Arterial blood gas does not show CO2 retention.  Took significant amount of discussion with the patient to get her to agree to do the arterial blood gas.  She wanted pain medication or something for her nerves before it. Echocardiogram is unremarkable there is no pulmonary hypertension.   Home medications include Xanax, Bentyl, Requip, Zoloft, and dextromethorphan guaifenesin all of which can contribute to dizziness, confusion, and falls.  Continue evaluation.  Physical therapy has been consulted. Will rreview radiology studies of brain with Radiologist

## 2018-06-07 NOTE — Progress Notes (Signed)
VASCULAR LAB PRELIMINARY  PRELIMINARY  PRELIMINARY  PRELIMINARY  Carotid duplex completed.    Preliminary report:  1-39% ICA plaquing. Vertebral artery flow is antegrade.   Natalea Sutliff, RVT 06/07/2018, 10:24 AM

## 2018-06-07 NOTE — Progress Notes (Signed)
Echocardiogram 2D Echocardiogram has been performed.  Erica York 06/07/2018, 10:52 AM

## 2018-06-08 DIAGNOSIS — Z515 Encounter for palliative care: Secondary | ICD-10-CM | POA: Diagnosis not present

## 2018-06-08 DIAGNOSIS — R55 Syncope and collapse: Secondary | ICD-10-CM | POA: Diagnosis not present

## 2018-06-08 DIAGNOSIS — Z66 Do not resuscitate: Secondary | ICD-10-CM

## 2018-06-08 DIAGNOSIS — Z7189 Other specified counseling: Secondary | ICD-10-CM | POA: Diagnosis not present

## 2018-06-08 DIAGNOSIS — J449 Chronic obstructive pulmonary disease, unspecified: Secondary | ICD-10-CM | POA: Diagnosis not present

## 2018-06-08 LAB — URINE CULTURE: Culture: <10000 — AB

## 2018-06-08 MED ORDER — ROPINIROLE HCL 1 MG PO TABS: 0.5000 mg | ORAL_TABLET | Freq: Every day | ORAL | Status: DC

## 2018-06-08 MED ORDER — DICYCLOMINE HCL 10 MG PO CAPS: 10.0000 mg | ORAL_CAPSULE | Freq: Three times a day (TID) | ORAL | Status: DC | PRN

## 2018-06-08 MED ORDER — DICYCLOMINE HCL 10 MG PO CAPS: 10.0000 mg | ORAL_CAPSULE | Freq: Two times a day (BID) | ORAL | Status: DC | PRN

## 2018-06-08 NOTE — Progress Notes (Signed)
Dr, Evangeline Gula contacted about dc bp and states pt can go.

## 2018-06-08 NOTE — Consult Note (Signed)
Consultation Note Date: 06/08/2018   Patient Name: Erica York  DOB: 28-Sep-1940  MRN: [id number]  Age / Sex: 77 y.o., female  PCP: Erica Bene, PA-C Referring Physician: Lady Deutscher, MD  Reason for Consultation: Establishing goals of care  HPI/Patient Profile: 77 y.o. female  admitted on 06/06/2018 from home with complaints of syncopal episodes. She has a past medical history of COPD, PUD, renal cell carcinoma (2016) s/p left nephrectomy, GI bleed due to duodenal ulcerations, diverticulitis, bladder cancer (2017) s/p x2 TURBT, hiatal hernia, GERD, anxiety, depression, and arthritis. During her ED course patient reported having syncopal episodes throughout the day. She denied pain. Vitals signs were unremarkable. Creatinine 1.25 otherwise labs unremarkable. CT brain/spine showed no acute abnormality with chronic appearing small vessel ischemic disease. CT CAP showed no acute injury. Solitary 4 mm solid left upper lobe pulmonary nodule and stable 2.7 cm right AAA. CT T/L Spine showed T5-T9 mild to moderate compression fracture with subcutaneous soft tissue contusion. Since admission continues on home medications and being evaluated by PT. Palliative Medicine consulted for goals of care discussion.   Clinical Assessment and Goals of Care: I have reviewed medical records including lab results, imaging, Epic notes, and MAR, received report from the bedside RN, and assessed the patient. I then discussed with patient her current prognosis, GOC, EOL wishes, disposition and options. Ms. Diles is A&O x3. She is capable of engaging in goals of care discussion. Offered to involve other family members however she expressed she could relay any pertinent information.   I introduced Palliative Medicine as specialized medical care for people living with serious illness. It focuses on providing relief from the  symptoms and stress of a serious illness. The goal is to improve quality of life for both the patient and the family.  We discussed a brief life review of the patient. Patient reports she currently lives alone. She recently relocated to [place] from [place] to be closer to her her children. She has 4 children, one has passed away. She has a Cocker Spaniel named [name] who she states is her best friend.    As far as functional and nutritional status she reports being able to care for herself independently in her home. She ambulates without any assistive devices. She reports she has home oxygen and wears only at night and during the day if she experiences shortness of breath or coughing spells. She states her appetite is fair and her sons checks her on daily along with her neighbors to make sure she has groceries etc. She reports she has loss some weight over the past year and was about 128lb and now weighs about 97 lbs.   We discussed her  current illness and what it means in the larger context of her on-going co-morbidities.  Natural disease trajectory and expectations at EOL were discussed. Patient verbalized awareness of co-morbidities and states she is unsure why she keeps passing out however. She reports hopefully it will resolve. She denies any association to the episodes.  She reports her goal is to return home. I discussed possibility of SNF for rehabilitation and patient immediately declined expressing she is not interested orf planning at all to go to a facility, but would be ok with home health if recommended.   I attempted to elicit values and goals of care important to the patient.    The difference between aggressive medical intervention and comfort care was considered in light of the patient's goals of care. At this time patient remains hopeful for improvement. She expresses she would like to continue to treat the treatable within reason.   She states her son and her are preparing to  complete her advance directives. In the event they are not completed prior to an emergency event she expresses her wishes are for her sons Erica York and Erica York are to be her HCPOA. She also expressed she is no interested in any forms of heroic measures.   I discussed with patient in detail about code status and artificial feedings. Patient expressed she would not want to undergo CPR, defibrillation, or intubation and also not interested in any forms of artificial feedings such as PEG tube. Advised I would change her code status based on her expressed wishes to DNR/DNI. Patient confirmed wishes. She was educated that staff would place purple DNR/DNI bracelet on her. She verbalized understanding and appreciation.   Hospice and Palliative Care services outpatient were explained and offered. Given patient's expressed wishes to continue to treat the treatable, ok with rehospitalization, and open to home health PT and other services, I explained to her that outpatient palliative would be most appropriate. Patient verbalized understanding and expressed wishes to have outpatient palliative to follow at discharge.  Patient also expressed concerns with financial needs with her monthly medications and requested to speak with someone about potential resources. Support given.   Questions and concerns were addressed. The family was encouraged to call with questions or concerns.  PMT will continue to support holistically.  Primary Decision Maker: Patient is an oriented x3.  Patient is capable of making her own medical decisions however she states in the event she is unable to make medical decisions her to [name] and Erica York would be her [facility] POA. PATIENT    SUMMARY OF RECOMMENDATIONS    DNR/DNI-at patient's request  Continue to treat the treatable while hospitalized without escalation of care.  Patient remains hopeful for continuous improvement.  She states she is not interested in SNF for  rehabilitation if requested however she is open to having home health PT in addition to outpatient palliative services.  Patient expresses need for counseling in regards to financial resources to assist with medications.  Case manager furl for outpatient palliative services and potential resources for medication assistance.  PMT will continue to follow as needed.  Code Status/Advance Care Planning:  DNR  Palliative Prophylaxis:   Bowel Regimen, Frequent Pain Assessment and Turn Reposition  Additional Recommendations (Limitations, Scope, Preferences): Full Scope Treatment -taken to treat the treatable.  Psycho-social/Spiritual:   Desire for further Chaplaincy support:NO   Additional Recommendations: Caregiving  Support/Resources and Medicaid/Financial Assistance  Prognosis:   Unable to determine-guarded to poor in the setting of syncopal episodes, anemia, COPD, deconditioning, poor p.o. intake, moderate protein calorie malnutrition, history of bladder and renal cell carcinoma, diverticulitis, osteoporosis, arthritis, hyperlipidemia, and hypertension.  Discharge Planning: Home with Palliative Services      Primary Diagnoses: Present on Admission: . Syncope . Anemia . COPD (chronic obstructive pulmonary disease) ([place])  I have reviewed the medical record, interviewed the patient and family, and examined the patient. The following aspects are pertinent.  Past Medical History:  Diagnosis Date  . Anemia   . Arthritis    hands  . Bladder cancer ([place])   . Chronic diarrhea   . Chronic gastritis   . COPD (chronic obstructive pulmonary disease) ([place])    per pt uses neb. as needed  . Depression   . Diverticulosis of colon   . Duodenal ulcer 08/01/2017   multiple per EGD  . GAD (generalized anxiety disorder)   . GERD (gastroesophageal reflux disease)   . Hiatal hernia   . History of bladder cancer 2017   s/p  TURBT's twice   . History of colonic diverticulitis   .  History of GI bleed 08/01/2017   and 10-06-2017 upper GI bleed due to multiple duonenal ulcer , duodenitis , gastritis (transfused 10-06-2017)  . History of renal cell carcinoma    dx 2016---  s/p  left nephrectomy  . Hyperlipidemia   . Osteoporosis   . RLS (restless legs syndrome)   . 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 Urge incontinence of urine    Social History   Socioeconomic History  . Marital status: Single    Spouse name: Not on file  . Number of children: Not on file  . Years of education: Not on file  . Highest education level: Not on file  Occupational History  . Not on file  Social Needs  . Financial resource strain: Not on file  . Food insecurity:    Worry: Not on file    Inability: Not on file  . Transportation needs:    Medical: Not on file    Non-medical: Not on file  Tobacco Use  . Smoking status: Current Some Day Smoker    Years: 60.00    Types: Cigarettes  . Smokeless tobacco: Never Used  . Tobacco comment: 03-16-2018  per pt smokes on 2 wks off 2wks and when smokes 1/5ppd  Substance and Sexual Activity  . Alcohol use: No    Frequency: Never  . Drug use: No  . Sexual activity: Never  Lifestyle  . Physical activity:    Days per week: Not on file    Minutes per session: Not on file  . Stress: Not on file  Relationships  . Social connections:    Talks on phone: Not on file    Gets together: Not on file    Attends religious service: Not on file    Active member of club or organization: Not on file    Attends meetings of clubs or organizations: Not on file    Relationship status: Not on file  Other Topics Concern  . Not on file  Social History Narrative  . Not on file   History reviewed. No pertinent family history. Scheduled Meds: . enoxaparin (LOVENOX) injection  40 mg Subcutaneous Q24H  . iron polysaccharides  150 mg Oral Daily  . mometasone-formoterol  2 puff Inhalation BID  . pantoprazole  40 mg Oral Daily  .  propranolol  40 mg Oral BID  . rOPINIRole  0.5 mg Oral QHS  . sertraline  100 mg Oral QHS   Continuous Infusions: PRN Meds:.acetaminophen **OR** acetaminophen, albuterol, ALPRAZolam, dicyclomine Medications Prior to Admission:  Prior to Admission medications   Medication Sig Start Date End Date Taking? Authorizing Provider  albuterol (PROVENTIL HFA;VENTOLIN HFA) 108 (90 Base) MCG/ACT inhaler  Inhale 2 puffs into the lungs every 6 (six) hours as needed for wheezing or shortness of breath. 04/25/18  Yes Patrecia Pour, Christean Grief, MD  ALPRAZolam Duanne Moron) 0.5 MG tablet Take 1 tablet (0.5 mg total) by mouth 2 (two) times daily as needed for anxiety. 04/25/18  Yes Doreatha Lew, MD  dicyclomine (BENTYL) 10 MG capsule Take 1 capsule (10 mg total) by mouth 3 (three) times daily as needed for spasms (bowel spasms). Patient taking differently: Take 10 mg by mouth 4 (four) times daily.  10/12/17  Yes Bonnielee Haff, MD  iron polysaccharides (NIFEREX) 150 MG capsule Take 1 capsule (150 mg total) by mouth daily. 04/26/18  Yes Patrecia Pour, Christean Grief, MD  mometasone-formoterol [facility]) 200-5 MCG/ACT AERO Inhale 2 puffs into the lungs 2 (two) times daily. 04/25/18  Yes Patrecia Pour, Christean Grief, MD  pantoprazole (PROTONIX) 40 MG tablet Take 1 tablet (40 mg total) by mouth 2 (two) times daily before a meal. After 1 month take 1 daily in the morning Patient taking differently: Take 40 mg by mouth daily.  04/25/18  Yes Patrecia Pour, Christean Grief, MD  propranolol (INDERAL) 40 MG tablet Take 1 tablet (40 mg total) by mouth 2 (two) times daily. 04/25/18  Yes Patrecia Pour, Christean Grief, MD  rOPINIRole (REQUIP) 2 MG tablet Take 2 mg by mouth at bedtime.   Yes [provider]  sertraline (ZOLOFT) 100 MG tablet Take 1 tablet (100 mg total) by mouth at bedtime. 08/08/17  Yes Gherghe, Vella Redhead, MD  dextromethorphan-guaiFENesin (MUCINEX DM) 30-600 MG 12hr tablet Take 1 tablet by mouth 2 (two) times daily as needed for cough. Patient not taking:  Reported on 06/06/2018 04/25/18   Patrecia Pour, Christean Grief, MD  sucralfate (CARAFATE) 1 GM/10ML suspension Take 10 mLs (1 g total) by mouth 4 (four) times daily -  with meals and at bedtime. Patient not taking: Reported on 06/06/2018 04/25/18   Patrecia Pour, Christean Grief, MD  tiotropium (SPIRIVA HANDIHALER) 18 MCG inhalation capsule Place 1 capsule (18 mcg total) into inhaler and inhale daily. Patient not taking: Reported on 06/06/2018 04/25/18 04/25/19  Doreatha Lew, MD   Allergies  Allergen Reactions  . Zosyn [Piperacillin Sod-Tazobactam So] Swelling and Rash   Review of Systems  Constitutional: Positive for activity change and fatigue.  Respiratory: Positive for shortness of breath.   Musculoskeletal: Positive for back pain.       Falls   Neurological: Positive for weakness.  All other systems reviewed and are negative.   Physical Exam  Constitutional: She is oriented to person, place, and time. Vital signs are normal. She is cooperative. She appears ill.  Thin and chronically ill appearing   Cardiovascular: Normal rate, regular rhythm, normal heart sounds and normal pulses.  Pulmonary/Chest: Effort normal. She has decreased breath sounds.  Congested cough, shortness of breath with exertion   Abdominal: Soft. Normal appearance and bowel sounds are normal.  Musculoskeletal:  Generalized weakness   Neurological: She is alert and oriented to person, place, and time.  Skin: Skin is warm, dry and intact.  Psychiatric: She has a normal mood and affect. Her speech is normal and behavior is normal. Judgment and thought content normal. Cognition and memory are normal.  Nursing note and vitals reviewed.   Vital Signs: BP (!) 172/80 (BP Location: Right Arm)   Pulse 63   Temp 97.9 F (36.6 C) (Oral)   Resp 18   Wt 47.6 kg   SpO2 93%   BMI 23.53 kg/m  Pain Scale: 0-10  Pain Score: Asleep   SpO2: SpO2: 93 % O2 Device:SpO2: 93 % O2 Flow Rate: .O2 Flow Rate (L/min): 2 L/min  IO:  Intake/output summary: No intake or output data in the 24 hours ending 06/08/18 1324  LBM:   Baseline Weight: Weight: 45 kg Most recent weight: Weight: 47.6 kg     Palliative Assessment/Data: PPS 40%   Time In: 1100 Time Out: 1215 Time Total: 75 min  Greater than 50%  of this time was spent counseling and coordinating care related to the above assessment and plan.  Signed by:  Alda Lea, AGPCNP-BC Palliative Medicine Team  Phone: [phone] Fax: [phone] Pager: [phone] Amion: Bjorn Pippin    Please contact Palliative Medicine Team phone at [phone] for questions and concerns.  For individual provider: See Shea Evans

## 2018-06-08 NOTE — Progress Notes (Signed)
Pt stats her breathing improved.resp unlabored and less diminished

## 2018-06-08 NOTE — Discharge Summary (Signed)
Physician Discharge Summary  Erica York XNA:[id number] DOB: Mar 17, 1941 DOA: 06/06/2018  PCP: Heywood Bene, PA-C  Admit date: 06/06/2018 Discharge date: 06/08/2018  Admitted From: Home Disposition: Home  Recommendations for Outpatient Follow-up:  1. Follow up with PCP Hansel Feinstein, PA ASAP 2. Please obtain BMP/CBC in one week   Home Health: Yes Equipment/Devices: Chronic oxygen Discharge Condition: Stable CODE STATUS: DO NOT RESUSCITATE Diet recommendation: Heart Healthy   Brief/Interim Summary: Erica York  is a 77 y.o. female, w Copd, PUD, anxiety,  RLS, apparently presents with c/o syncope since Monday.  Pt states that the episodes last a few seconds and no presyncopal symptoms. Pt had multiple episodes today and therefore presented to Ed.  Pt denies cp, palp, sob beyond her baseline, abd pain, diarrhea, brbpr, dysuria.  Work-up proceeded and Arterial blood gas does not show CO2 retention.  Took significant amount of discussion with the patient to get her to agree to do the arterial blood gas.  She wanted pain medication or something for her nerves before it.  Ultimately arterial blood gas was obtained but was quite difficult to get. Echocardiogram is unremarkable there is no pulmonary hypertension.  Home medications include Xanax, Bentyl, Requip, Zoloft, and dextromethorphan guaifenesin all of which can contribute to dizziness, confusion, and falls.  Both Requip and Bentyl have a 40% chance of causing dizziness and patient to take it and patient takes both of these medications.  In fact she has recently increased her Requip from 2 mg to 4 mg on her own.  She is also been taking Bentyl 4 times a day scheduled.  After long discussion with the patient I feel that these medications are causing her problems.  She is unwilling to discontinue her Requip for to decrease her dose.  She is willing however to stop using the Bentyl.  At this point I have negotiated that she  discontinue the Bentyl and have a serious discussion with [name] regarding her use of Requip.  I think that given the patient's COPD any kind of dizziness or other mind altering situations would cause the patient to be extremely high risk for continued falls and syncope.  Patient was not willing to discuss this further at this point but wants to think about her options.  Also is very fond of her Xanax and would not entertain any discussion regarding Xanax and how it might affect people with COPD.[facility] therapy had recommended skilled nursing facility but patient has declined and is agreeable to home health. Patient has reached maximal benefit of hospitalization.  Discharge diagnosis, prognosis, plans, follow-up, medications and treatments discussed with the patient(or responsible party) and is in agreement with the plans as described.  Patient is stable for discharge.  Discharge Diagnoses:  Principal Problem:   Syncope Active Problems:   Anemia   COPD (chronic obstructive pulmonary disease) ([place])    Discharge Instructions  Discharge Instructions    Call MD for:  persistant dizziness or light-headedness   Complete by:  As directed    Diet - low sodium heart healthy   Complete by:  As directed    Discharge instructions   Complete by:  As directed    Make and keep follow up appointment with Dr Jimmye Norman and discuss you current use of requip.  Stop using bentyl (blue pill)   Increase activity slowly   Complete by:  As directed      Allergies as of 06/08/2018      Reactions   Zosyn [  piperacillin Sod-tazobactam So] Swelling, Rash      Medication List    STOP taking these medications   dicyclomine 10 MG capsule Commonly known as:  BENTYL     TAKE these medications   albuterol 108 (90 Base) MCG/ACT inhaler Commonly known as:  PROVENTIL HFA;VENTOLIN HFA Inhale 2 puffs into the lungs every 6 (six) hours as needed for wheezing or shortness of breath.   ALPRAZolam 0.5 MG  tablet Commonly known as:  XANAX Take 1 tablet (0.5 mg total) by mouth 2 (two) times daily as needed for anxiety.   dextromethorphan-guaiFENesin 30-600 MG 12hr tablet Commonly known as:  MUCINEX DM Take 1 tablet by mouth 2 (two) times daily as needed for cough.   iron polysaccharides 150 MG capsule Commonly known as:  NIFEREX Take 1 capsule (150 mg total) by mouth daily.   mometasone-formoterol 200-5 MCG/ACT Aero Commonly known as:  DULERA Inhale 2 puffs into the lungs 2 (two) times daily.   pantoprazole 40 MG tablet Commonly known as:  PROTONIX Take 1 tablet (40 mg total) by mouth 2 (two) times daily before a meal. After 1 month take 1 daily in the morning What changed:    when to take this  additional instructions   propranolol 40 MG tablet Commonly known as:  INDERAL Take 1 tablet (40 mg total) by mouth 2 (two) times daily.   rOPINIRole 2 MG tablet Commonly known as:  REQUIP Take 2 mg by mouth at bedtime.   sertraline 100 MG tablet Commonly known as:  ZOLOFT Take 1 tablet (100 mg total) by mouth at bedtime.   sucralfate 1 GM/10ML suspension Commonly known as:  CARAFATE Take 10 mLs (1 g total) by mouth 4 (four) times daily -  with meals and at bedtime.   tiotropium 18 MCG inhalation capsule Commonly known as:  SPIRIVA Place 1 capsule (18 mcg total) into inhaler and inhale daily.      [facility] Follow up.   Specialty:  Home Health Services Why:  Registered Nurse, Physical Therapy, Social Worker Contact information: [address] [place] [address] [phone]        Heywood Bene, PA-C. Schedule an appointment as soon as possible for a visit.   Specialty:  Physician Assistant Contact information: [address] [place] [address] [phone]          Allergies  Allergen Reactions  . Zosyn [Piperacillin Sod-Tazobactam So] Swelling and Rash     Procedures/Studies: Ct Abdomen Pelvis Wo Contrast  Result Date: 06/06/2018 CLINICAL DATA:  Recent fall. Syncopal episodes. Dyspnea. Abdominal distension. History of hemicolectomy, inguinal hernia repair, cholecystectomy, left nephrectomy for renal cell carcinoma and TURBT for bladder cancer. EXAM: CT CHEST, ABDOMEN AND PELVIS WITHOUT CONTRAST TECHNIQUE: Multidetector CT imaging of the chest, abdomen and pelvis was performed following the standard protocol without IV contrast. COMPARISON:  Chest radiograph from earlier today. 03/04/2018 CT abdomen/pelvis. FINDINGS: CT CHEST FINDINGS Cardiovascular: Normal heart size. No significant pericardial effusion/thickening. Left anterior descending coronary atherosclerosis. Atherosclerotic nonaneurysmal thoracic aorta. Normal caliber pulmonary arteries. Mediastinum/Nodes: No discrete thyroid nodules. Unremarkable esophagus. No pathologically enlarged axillary, mediastinal or hilar lymph nodes, noting limited sensitivity for the detection of hilar adenopathy on this noncontrast study. Lungs/Pleura: No pneumothorax. No pleural effusion. Severe centrilobular emphysema with diffuse bronchial wall thickening. Peripheral left upper lobe 4 mm solid pulmonary nodule (series 506/image 85). Mildly thickened parenchymal bands in the basilar right  upper lobe and dependent lung bases compatible with mild postinfectious/postinflammatory scarring. No lung masses. No additional significant pulmonary nodules. No acute consolidative airspace disease. Musculoskeletal: No aggressive appearing focal osseous lesions. Moderate compression fractures of the T5, T6, T7, T8 and T9 vertebral bodies, which appear chronic and stable since lateral 10/06/2017 chest radiograph. Moderate thoracic spondylosis. Long segment levocurvature of the thoracolumbar spine. CT ABDOMEN PELVIS FINDINGS Hepatobiliary: Normal liver size. Stable granulomatous liver dome calcification. No liver mass. Cholecystectomy.  Bile ducts are stable and compatible with post cholecystectomy change with CBD diameter 12 mm. Pancreas: Normal, with no mass or duct dilation. Spleen: Normal size. No mass. Adrenals/Urinary Tract: No discrete adrenal nodules. Status post left nephrectomy. No mass or fluid collection in the left nephrectomy bed. No right hydronephrosis. No right renal stones. Simple 1.0 cm upper right renal cysts. No contour deforming right renal lesions. Stable curvilinear calcification in the midline anterior bladder wall. No acute bladder abnormality. Stomach/Bowel: Normal non-distended stomach. Normal caliber small bowel with no small bowel wall thickening. Apparent appendectomy. Subtotal left colectomy with distal colonic anastomosis. No large bowel wall thickening or acute pericolonic fat stranding. Vascular/Lymphatic: Atherosclerotic abdominal aorta with saccular 2.7 cm right abdominal aortic aneurysm (series 505/image 61), stable. No pathologically enlarged lymph nodes in the abdomen or pelvis. Reproductive: Status post hysterectomy, with no abnormal findings at the vaginal cuff. No adnexal mass. Other: No pneumoperitoneum, ascites or focal fluid collection. Ventral abdominal hernia repair mesh with no evidence of recurrent hernia. Musculoskeletal: No aggressive appearing focal osseous lesions. No acute fracture. Severe L4 vertebral compression fracture is chronic and stable since 03/04/2018 CT. Moderate lumbar spondylosis. Long segment levocurvature of the thoracolumbar spine. IMPRESSION: 1. No acute traumatic injury in the chest, abdomen or pelvis. Chronic thoracolumbar vertebral compression fractures as detailed. 2. Severe centrilobular emphysema with diffuse bronchial wall thickening, suggesting COPD. 3. Solitary 4 mm solid left upper lobe pulmonary nodule. No follow-up needed if patient is low-risk. Non-contrast chest CT can be considered in 12 months if patient is high-risk. This recommendation follows the consensus  statement: Guidelines for Management of Incidental Pulmonary Nodules Detected on CT Images:From the Fleischner Society 2017; published online before print (10.1148/radiol.[id number]). 4. One vessel coronary atherosclerosis. 5. No acute abnormality in the abdomen or pelvis. Stable postsurgical changes from colonic resection, cholecystectomy, left nephrectomy and ventral hernia repair. No evidence of bowel obstruction or acute bowel inflammation. 6. Stable 2.7 cm saccular right abdominal aortic aneurysm. 7. Aortic Atherosclerosis (ICD10-I70.0) and Emphysema (ICD10-J43.9). Electronically Signed   By: Ilona Sorrel M.D.   On: 06/06/2018 23:02   Ct Head Wo Contrast  Result Date: 06/06/2018 CLINICAL DATA:  Multiple syncopal episodes. History of bladder cancer and renal cancer. EXAM: CT HEAD WITHOUT CONTRAST CT CERVICAL SPINE WITHOUT CONTRAST TECHNIQUE: Multidetector CT imaging of the head and cervical spine was performed following the standard protocol without intravenous contrast. Multiplanar CT image reconstructions of the cervical spine were also generated. COMPARISON:  None. FINDINGS: CT HEAD FINDINGS Brain: No evidence of acute infarction, hemorrhage, hydrocephalus, extra-axial collection or mass lesion/mass effect. Likely chronic minimal small vessel ischemic disease of periventricular white matter. Vascular: Atherosclerosis at the skull base. No hyperdense vessel sign. Skull: Negative for fracture or suspicious osseous lesions. Sinuses/Orbits: Intact orbits and globes. Paranasal sinuses are nonacute. Clear mastoids. Other: None CT CERVICAL SPINE FINDINGS Alignment: Maintained cervical lordosis. Slight grade 1 retrolisthesis of C2 on C3. Minimal anterolisthesis of C4 on C5. Skull base and vertebrae: Intact skull base. No cervical spine fracture  or suspicious osseous lesions. Soft tissues and spinal canal: No prevertebral soft tissue swelling. No visible canal hematoma. Disc levels: Moderate-to-marked  degenerative disc disease C2-3 and from C4 through C7 with mild moderate disc flattening C3-4. Small posterior marginal osteophytes are noted at C4-5 and C5-6. No significant central foraminal stenosis. Mild bilateral facet arthropathy. Uncovertebral joint osteoarthritis is seen at C2-3, left C3-4, on the right at C4-5 and bilaterally at C5-6 and C6-7. Upper chest: Centrilobular emphysema. Aortic atherosclerosis. Extracranial carotid arteriosclerosis. Other: None IMPRESSION: No acute intracranial abnormality. Chronic appearing minimal small vessel ischemic disease. Cervical spondylosis with facet arthropathy. No acute cervical spine fracture or traumatic listhesis. Electronically Signed   By: Ashley Royalty M.D.   On: 06/06/2018 22:48   Ct Chest Wo Contrast  Result Date: 06/06/2018 CLINICAL DATA:  Recent fall. Syncopal episodes. Dyspnea. Abdominal distension. History of hemicolectomy, inguinal hernia repair, cholecystectomy, left nephrectomy for renal cell carcinoma and TURBT for bladder cancer. EXAM: CT CHEST, ABDOMEN AND PELVIS WITHOUT CONTRAST TECHNIQUE: Multidetector CT imaging of the chest, abdomen and pelvis was performed following the standard protocol without IV contrast. COMPARISON:  Chest radiograph from earlier today. 03/04/2018 CT abdomen/pelvis. FINDINGS: CT CHEST FINDINGS Cardiovascular: Normal heart size. No significant pericardial effusion/thickening. Left anterior descending coronary atherosclerosis. Atherosclerotic nonaneurysmal thoracic aorta. Normal caliber pulmonary arteries. Mediastinum/Nodes: No discrete thyroid nodules. Unremarkable esophagus. No pathologically enlarged axillary, mediastinal or hilar lymph nodes, noting limited sensitivity for the detection of hilar adenopathy on this noncontrast study. Lungs/Pleura: No pneumothorax. No pleural effusion. Severe centrilobular emphysema with diffuse bronchial wall thickening. Peripheral left upper lobe 4 mm solid pulmonary nodule (series  506/image 85). Mildly thickened parenchymal bands in the basilar right upper lobe and dependent lung bases compatible with mild postinfectious/postinflammatory scarring. No lung masses. No additional significant pulmonary nodules. No acute consolidative airspace disease. Musculoskeletal: No aggressive appearing focal osseous lesions. Moderate compression fractures of the T5, T6, T7, T8 and T9 vertebral bodies, which appear chronic and stable since lateral 10/06/2017 chest radiograph. Moderate thoracic spondylosis. Long segment levocurvature of the thoracolumbar spine. CT ABDOMEN PELVIS FINDINGS Hepatobiliary: Normal liver size. Stable granulomatous liver dome calcification. No liver mass. Cholecystectomy. Bile ducts are stable and compatible with post cholecystectomy change with CBD diameter 12 mm. Pancreas: Normal, with no mass or duct dilation. Spleen: Normal size. No mass. Adrenals/Urinary Tract: No discrete adrenal nodules. Status post left nephrectomy. No mass or fluid collection in the left nephrectomy bed. No right hydronephrosis. No right renal stones. Simple 1.0 cm upper right renal cysts. No contour deforming right renal lesions. Stable curvilinear calcification in the midline anterior bladder wall. No acute bladder abnormality. Stomach/Bowel: Normal non-distended stomach. Normal caliber small bowel with no small bowel wall thickening. Apparent appendectomy. Subtotal left colectomy with distal colonic anastomosis. No large bowel wall thickening or acute pericolonic fat stranding. Vascular/Lymphatic: Atherosclerotic abdominal aorta with saccular 2.7 cm right abdominal aortic aneurysm (series 505/image 61), stable. No pathologically enlarged lymph nodes in the abdomen or pelvis. Reproductive: Status post hysterectomy, with no abnormal findings at the vaginal cuff. No adnexal mass. Other: No pneumoperitoneum, ascites or focal fluid collection. Ventral abdominal hernia repair mesh with no evidence of recurrent  hernia. Musculoskeletal: No aggressive appearing focal osseous lesions. No acute fracture. Severe L4 vertebral compression fracture is chronic and stable since 03/04/2018 CT. Moderate lumbar spondylosis. Long segment levocurvature of the thoracolumbar spine. IMPRESSION: 1. No acute traumatic injury in the chest, abdomen or pelvis. Chronic thoracolumbar vertebral compression fractures as detailed. 2. Severe centrilobular  emphysema with diffuse bronchial wall thickening, suggesting COPD. 3. Solitary 4 mm solid left upper lobe pulmonary nodule. No follow-up needed if patient is low-risk. Non-contrast chest CT can be considered in 12 months if patient is high-risk. This recommendation follows the consensus statement: Guidelines for Management of Incidental Pulmonary Nodules Detected on CT Images:From the Fleischner Society 2017; published online before print (10.1148/[id number]). 4. One vessel coronary atherosclerosis. 5. No acute abnormality in the abdomen or pelvis. Stable postsurgical changes from colonic resection, cholecystectomy, left nephrectomy and ventral hernia repair. No evidence of bowel obstruction or acute bowel inflammation. 6. Stable 2.7 cm saccular right abdominal aortic aneurysm. 7. Aortic Atherosclerosis (ICD10-I70.0) and Emphysema (ICD10-J43.9). Electronically Signed   By: Ilona Sorrel M.D.   On: 06/06/2018 23:02   Ct Cervical Spine Wo Contrast  Result Date: 06/06/2018 CLINICAL DATA:  Multiple syncopal episodes. History of bladder cancer and renal cancer. EXAM: CT HEAD WITHOUT CONTRAST CT CERVICAL SPINE WITHOUT CONTRAST TECHNIQUE: Multidetector CT imaging of the head and cervical spine was performed following the standard protocol without intravenous contrast. Multiplanar CT image reconstructions of the cervical spine were also generated. COMPARISON:  None. FINDINGS: CT HEAD FINDINGS Brain: No evidence of acute infarction, hemorrhage, hydrocephalus, extra-axial collection or mass  lesion/mass effect. Likely chronic minimal small vessel ischemic disease of periventricular white matter. Vascular: Atherosclerosis at the skull base. No hyperdense vessel sign. Skull: Negative for fracture or suspicious osseous lesions. Sinuses/Orbits: Intact orbits and globes. Paranasal sinuses are nonacute. Clear mastoids. Other: None CT CERVICAL SPINE FINDINGS Alignment: Maintained cervical lordosis. Slight grade 1 retrolisthesis of C2 on C3. Minimal anterolisthesis of C4 on C5. Skull base and vertebrae: Intact skull base. No cervical spine fracture or suspicious osseous lesions. Soft tissues and spinal canal: No prevertebral soft tissue swelling. No visible canal hematoma. Disc levels: Moderate-to-marked degenerative disc disease C2-3 and from C4 through C7 with mild moderate disc flattening C3-4. Small posterior marginal osteophytes are noted at C4-5 and C5-6. No significant central foraminal stenosis. Mild bilateral facet arthropathy. Uncovertebral joint osteoarthritis is seen at C2-3, left C3-4, on the right at C4-5 and bilaterally at C5-6 and C6-7. Upper chest: Centrilobular emphysema. Aortic atherosclerosis. Extracranial carotid arteriosclerosis. Other: None IMPRESSION: No acute intracranial abnormality. Chronic appearing minimal small vessel ischemic disease. Cervical spondylosis with facet arthropathy. No acute cervical spine fracture or traumatic listhesis. Electronically Signed   By: Ashley Royalty M.D.   On: 06/06/2018 22:48   Ct T-spine No Charge  Result Date: 06/06/2018 CLINICAL DATA:  Post fall with hematoma to back. EXAM: CT THORACIC SPINE WITHOUT CONTRAST TECHNIQUE: Multidetector CT images of the thoracic were obtained using the standard protocol without intravenous contrast. Performed in conjunction with CT of the lumbar spine and CT of the chest, abdomen, and pelvis. COMPARISON:  No prior chest are thoracic spine CT. Chest radiograph 10/06/2017, with limited spinal visualization. FINDINGS:  Alignment: Moderate S-shaped scoliotic curvature of the thoracolumbar spine. Vertebrae: Loss of height at T5, T6, T7, T8, and T9. No paravertebral soft tissue thickening to suggest acute fracture. The posterior elements are intact. Paraspinal and other soft tissues: No anterior paravertebral soft tissue thickening. There is mild posterior paravertebral subcutaneous soft tissue density suggesting soft tissue contusion. Chest better assessed on full field of view chest CT. Disc levels: Mild diffuse disc space loss. Facet hypertrophy most prominent in the lower thoracic spine. IMPRESSION: 1. Compression fractures at T5 through T9, mild to moderate. No definite paravertebral edema to suggest acute fracture, however no prior exams are  available for direct comparison. MRI could be obtained to evaluate for acuity based on clinical concern. 2. Posterior subcutaneous soft tissue contusion suspected. 3. Moderate scoliotic curvature with mild multilevel degenerative change. Electronically Signed   By: Keith Rake M.D.   On: 06/06/2018 23:08   Ct L-spine No Charge  Result Date: 06/06/2018 CLINICAL DATA:  Post fall with hematoma to back. EXAM: CT LUMBAR SPINE WITHOUT CONTRAST TECHNIQUE: Multidetector CT imaging of the lumbar spine was performed without intravenous contrast administration. Multiplanar CT image reconstructions were also generated. Performed in conjunction with CT of the thoracic spine, and CT of the chest, abdomen, and pelvis. COMPARISON:  Abdominopelvic CT 11/25/2017 FINDINGS: Segmentation: 5 lumbar type vertebrae. Alignment: Moderate levo scoliotic curvature of the lower thoracic and upper lumbar spine. Minimal anterolisthesis of L2 on L3. Vertebrae: Chronic moderate to severe L4 compression fracture, unchanged from prior exam. Chronic loss of height of L2 with concave inferior endplate. No acute compression fracture. The posterior elements appear intact. No acute lumbar fracture. Paraspinal and other  soft tissues: No confluent soft tissue hematoma. Abdominopelvic evaluation better assessed on dedicated abdominopelvic CT. Disc levels: Disc space narrowing and endplate spurring related scoliotic curvature most prominent at L1-L2. Disc space narrowing and endplate spurring at V3-X1. Multilevel facet arthropathy. IMPRESSION: 1. No acute fracture of the lumbar spine. 2. Remote moderate severe L4 compression fracture. Chronic mild loss of height of L2 with inferior endplate concavity. 3. Scoliosis and multilevel degenerative change. Electronically Signed   By: Keith Rake M.D.   On: 06/06/2018 23:00   Dg Chest Portable 1 View  Result Date: 06/06/2018 CLINICAL DATA:  Syncope. EXAM: PORTABLE CHEST 1 VIEW COMPARISON:  Radiographs of April 23, 2018. FINDINGS: Stable cardiomediastinal silhouette. Atherosclerosis thoracic aorta is noted. No pneumothorax or pleural effusion is noted. No acute pulmonary disease is noted. Bony thorax is unremarkable. IMPRESSION: No acute cardiopulmonary abnormality seen. Aortic Atherosclerosis (ICD10-I70.0). Electronically Signed   By: Marijo Conception, M.D.   On: 06/06/2018 21:05    Carotid Dopplers:  Right Carotid: Velocities in the right ICA are consistent with a 1-39% stenosis.    Left Carotid: Velocities in the left ICA are consistent with a 1-39% stenosis.    Vertebrals: Bilateral vertebral arteries demonstrate antegrade flow.    Subclavians: Normal flow hemodynamics were seen in bilateral subclavian arteries.  Echocardiogram: Left ventricle: The cavity size was normal. Systolic function was normal. The estimated ejection fraction was in the range of 60% to 65%. Wall    motion was normal; there were no regional wall motion abnormalities. Left ventricular diastolic function parameters were normal.    - Atrial septum: No defect or patent foramen ovale was identified.    - Pulmonary arteries: PA peak pressure: 41 mm Hg (S).   Subjective:   Discharge  Exam: Vitals:   06/08/18 0826 06/08/18 1130  BP:  (!) 172/80  Pulse:  63  Resp:  18  Temp:  97.9 F (36.6 C)  SpO2: 93% 93%   Vitals:   06/07/18 2336 06/08/18 0620 06/08/18 0826 06/08/18 1130  BP:  (!) 151/82  (!) 172/80  Pulse: (!) 55 60  63  Resp:  18  18  Temp:  97.9 F (36.6 C)  97.9 F (36.6 C)  TempSrc:  Oral  Oral  SpO2:  92% 93% 93%  Weight:  47.6 kg      General: Pt is alert, awake, not in acute distress Cardiovascular: RRR, S1/S2 +, no rubs, no gallops Respiratory: CTA  bilaterally, no wheezing, no rhonchi Abdominal: Soft, NT, ND, bowel sounds + Extremities: no edema, no cyanosis    The results of significant diagnostics from this hospitalization (including imaging, microbiology, ancillary and laboratory) are listed below for reference.     Microbiology: Recent Results (from the past 240 hour(s))  Urine culture     Status: Abnormal   Collection Time: 06/07/18  1:53 AM  Result Value Ref Range Status   Specimen Description URINE, CLEAN CATCH  Final   Special Requests NONE  Final   Culture (A)  Final    <10,000 COLONIES/mL INSIGNIFICANT GROWTH Performed at [facility], [address] [place], [place] [address]    Report Status 06/08/2018 FINAL  Final     Labs: BNP (last 3 results) No results for input(s): BNP in the last 8760 hours. Basic Metabolic Panel: Recent Labs  Lab 06/06/18 2031 06/06/18 2100 06/07/18 0620  NA 142 141 140  K 4.9 5.3* 5.1  CL 110 109 109  CO2 25  --  22  GLUCOSE 112* 109* 136*  BUN 14 21 14   CREATININE 1.25* 1.30* 0.95  CALCIUM 9.5  --  9.3   Liver Function Tests: Recent Labs  Lab 06/06/18 2031 06/07/18 0620  AST 14* 15  ALT 8 7  ALKPHOS 124 105  BILITOT 0.5 0.5  PROT 7.3 6.3*  ALBUMIN 4.1 3.3*   No results for input(s): LIPASE, AMYLASE in the last 168 hours. No results for input(s): AMMONIA in the last 168 hours. CBC: Recent Labs  Lab 06/06/18 2031 06/06/18 2100 06/07/18 0620  WBC 7.1  --  5.4   HGB 11.4* 12.9 10.2*  HCT 40.4 38.0 35.9*  MCV 90.4  --  87.6  PLT 256  --  228   Cardiac Enzymes: Recent Labs  Lab 06/06/18 2031 06/07/18 0045 06/07/18 0620 06/07/18 1212  CKTOTAL 50  --   --   --   TROPONINI  --  <0.03 <0.03 <0.03   BNP: Invalid input(s): POCBNP CBG: Recent Labs  Lab 06/06/18 2107  GLUCAP 96   D-Dimer No results for input(s): DDIMER in the last 72 hours. Hgb A1c No results for input(s): HGBA1C in the last 72 hours. Lipid Profile No results for input(s): CHOL, HDL, LDLCALC, TRIG, CHOLHDL, LDLDIRECT in the last 72 hours. Thyroid function studies Recent Labs    06/07/18 0045  TSH 0.742   Anemia work up No results for input(s): VITAMINB12, FOLATE, FERRITIN, TIBC, IRON, RETICCTPCT in the last 72 hours. Urinalysis    Component Value Date/Time   COLORURINE YELLOW 06/07/2018 0159   APPEARANCEUR CLOUDY (A) 06/07/2018 0159   LABSPEC 1.016 06/07/2018 0159   PHURINE 5.0 06/07/2018 0159   GLUCOSEU NEGATIVE 06/07/2018 0159   HGBUR LARGE (A) 06/07/2018 0159   BILIRUBINUR NEGATIVE 06/07/2018 0159   KETONESUR NEGATIVE 06/07/2018 0159   PROTEINUR 30 (A) 06/07/2018 0159   NITRITE NEGATIVE 06/07/2018 0159   LEUKOCYTESUR MODERATE (A) 06/07/2018 0159   Sepsis Labs Invalid input(s): PROCALCITONIN,  WBC,  LACTICIDVEN Microbiology Recent Results (from the past 240 hour(s))  Urine culture     Status: Abnormal   Collection Time: 06/07/18  1:53 AM  Result Value Ref Range Status   Specimen Description URINE, CLEAN CATCH  Final   Special Requests NONE  Final   Culture (A)  Final    <10,000 COLONIES/mL INSIGNIFICANT GROWTH Performed at [facility], [place]. [address], [place], [place] [address]    Report Status 06/08/2018 FINAL  Final     Time coordinating discharge: 48 minutes  SIGNED:   Lady Deutscher, MD  FACP [facility] 06/08/2018, 1:42 PM Pager   If 7PM-7AM, please contact night-coverage [email or web address] Password [email or web address]

## 2018-06-08 NOTE — Evaluation (Signed)
Physical Therapy Evaluation Patient Details Name: Erica York MRN: [id number] DOB: 02/02/1941 Today's Date: 06/08/2018   History of Present Illness  77yo female with hx of syncope since Monday, and multiple episodes of syncope on 06/07/18. Negative for acute fracture. PMH bladder CA, COPD, RLS, hernia repair   Clinical Impression   Patient received in bed, pleasant and willing to participate in PT session today; note mild SOB and dizziness at rest in bed, unable to get accurate pulse ox signal/reading and patient declines BP due to pain at distal IV site with cuff activation. Able to complete bed mobility with Mod(I) but requires min guard for transfers with RW, very unsafe with assistive device and requires MinA to maintain balance and safety with ambulation in room with RW to bathroom. Able to toilet with close S, returned to bed with MinA and RW for safety. She was left in bed with all needs met, bed alarm activated. Strongly recommend ST-SNF prior to return home, however should she decline she will require HHPT and 24/7 S for safety.     Follow Up Recommendations SNF;Other (comment);Supervision/Assistance - 24 hour(if she declines SNF, will require HHPT and 24/7 S for safety )    Equipment Recommendations  3in1 (PT);Rolling walker with 5" wheels    Recommendations for Other Services       Precautions / Restrictions Precautions Precautions: Fall;Other (comment) Precaution Comments: watch HR and O2  Restrictions Weight Bearing Restrictions: No      Mobility  Bed Mobility Overal bed mobility: Modified Independent                Transfers Overall transfer level: Needs assistance Equipment used: Rolling walker (2 wheeled) Transfers: Sit to/from Stand Sit to Stand: Min guard         General transfer comment: min guard for safety and steadying   Ambulation/Gait Ambulation/Gait assistance: Min assist Gait Distance (Feet): 15 Feet(x2) Assistive device: Rolling  walker (2 wheeled) Gait Pattern/deviations: Step-through pattern;Decreased step length - right;Decreased step length - left;Decreased stance time - right;Drifts right/left;Trunk flexed;Narrow base of support     General Gait Details: very unsafe use of RW, tends to drift L and R and requires MinA to steady due to poor balance; unable to take accurate reading of O2/BP during session   Stairs            Wheelchair Mobility    Modified Rankin (Stroke Patients Only)       Balance Overall balance assessment: Needs assistance Sitting-balance support: Bilateral upper extremity supported;Feet supported Sitting balance-Leahy Scale: Good     Standing balance support: Bilateral upper extremity supported;During functional activity Standing balance-Leahy Scale: Poor                               Pertinent Vitals/Pain Pain Assessment: No/denies pain    Home Living Family/patient expects to be discharged to:: Private residence Living Arrangements: Alone Available Help at Discharge: Family Type of Home: House Home Access: Stairs to enter Entrance Stairs-Rails: Can reach both Entrance Stairs-Number of Steps: 3 Home Layout: One level Home Equipment: Walker - 4 wheels;Grab bars - tub/shower Additional Comments: children live 15 min away    Prior Function Level of Independence: Independent with assistive device(s)         Comments: uses rollator PRN     Hand Dominance        Extremity/Trunk Assessment   Upper Extremity Assessment Upper Extremity Assessment: Generalized weakness  Lower Extremity Assessment Lower Extremity Assessment: Generalized weakness    Cervical / Trunk Assessment Cervical / Trunk Assessment: Kyphotic  Communication   Communication: No difficulties  Cognition Arousal/Alertness: Awake/alert Behavior During Therapy: WFL for tasks assessed/performed Overall Cognitive Status: Within Functional Limits for tasks assessed                                         General Comments      Exercises     Assessment/Plan    PT Assessment Patient needs continued PT services  PT Problem List Decreased strength;Decreased knowledge of use of DME;Decreased activity tolerance;Decreased safety awareness;Decreased balance;Decreased mobility;Decreased coordination       PT Treatment Interventions DME instruction;Balance training;Gait training;Neuromuscular re-education;Stair training;Functional mobility training;Patient/family education;Therapeutic activities;Therapeutic exercise;Manual techniques    PT Goals (Current goals can be found in the Care Plan section)  Acute Rehab PT Goals Patient Stated Goal: to go home  PT Goal Formulation: With patient Time For Goal Achievement: 06/22/18 Potential to Achieve Goals: Good    Frequency Min 3X/week   Barriers to discharge        Co-evaluation               AM-PAC PT "6 Clicks" Daily Activity  Outcome Measure Difficulty turning over in bed (including adjusting bedclothes, sheets and blankets)?: None Difficulty moving from lying on back to sitting on the side of the bed? : None Difficulty sitting down on and standing up from a chair with arms (e.g., wheelchair, bedside commode, etc,.)?: A Little Help needed moving to and from a bed to chair (including a wheelchair)?: A Little Help needed walking in hospital room?: A Lot Help needed climbing 3-5 steps with a railing? : A Lot 6 Click Score: 18    End of Session Equipment Utilized During Treatment: Gait belt;Oxygen Activity Tolerance: Patient tolerated treatment well Patient left: in bed;with bed alarm set;with call bell/phone within reach Nurse Communication: Mobility status PT Visit Diagnosis: Unsteadiness on feet (R26.81);Difficulty in walking, not elsewhere classified (R26.2);Muscle weakness (generalized) (M62.81)    Time: 1610-9604 PT Time Calculation (min) (ACUTE ONLY): 28 min   Charges:   PT  Evaluation $PT Eval Moderate Complexity: 1 Mod PT Treatments $Gait Training: 8-22 mins        Deniece Ree PT, DPT, CBIS  Supplemental Physical Therapist [place]    Pager [phone] Acute Rehab Office [phone]

## 2018-06-08 NOTE — Progress Notes (Signed)
Pt resting quietly and denies c/o or pain.

## 2018-06-08 NOTE — Progress Notes (Signed)
Pt c/o shob request albuterol neb

## 2018-06-08 NOTE — Progress Notes (Signed)
Pt has food tray and food from, family t bedsid ewith minimal amounts eaten.

## 2018-06-08 NOTE — Plan of Care (Signed)
Pt states understands instructions

## 2018-06-08 NOTE — Care Management Note (Addendum)
Case Management Note  Patient Details  Name: Erica York MRN: [id number] Date of Birth: 30-Mar-1941  Subjective/Objective: 77yo female presented with syncope.                    Action/Plan: CM met with patient to discuss transitional needs and PT recommendations. Patient lives at home alone, indicated being independent with ADLs, utilizing a RW during ambulation, having grab bars in her tub/shower; home O2 @ 2LPM with [facility]. PCP confirmed as: Dr. Clyde Lundborg; [facility] [place], [place]. PT recommends ST SNF, with patient declining, but agreeable to [facility] services. Patient stated her 2 sons frequently visits and would be able to provide assistance as needed and transportation home with portable O2 tank. Patient indicated one of her son's lives 4 minutes away from her residence. CM discussed PT recommendations for a BSC/RW, with patient declining; patient has a RW which was provided by her son. HH preference provided, with [facility] HH selected for [facility] services. HH referral given to Glyn Ade, [facility] liaison; AVS updated. Patient has no Medicare Part D (drug coverage) and verbalized to CM difficulty in obtaining her Rxs. [facility], but she's unable to afford the copays. CM discussed Goodrx and requesting assistance from family, with patient stating she's aware of GoodRx and have received numerous assistance from her family in the past, but they have their own health issues. Patient may be eligible for MATCH, and medications could be filled using the [facility] service, with patient agreeable. CM discussed concerns with Dr. Evangeline Gula. CM will continue to follow.   Expected Discharge Date:  06/08/18               Expected Discharge Plan:  [place]  In-House Referral:  Hospice / [place]  Discharge planning Services  CM Consult, Medication Assistance  Post Acute Care Choice:  Home Health Choice offered to:   Patient  DME Arranged:  N/A DME Agency:  NA  HH Arranged:  RN, Disease Management, PT, Social Work, Refused SNF [place] Agency:  [facility]  Status of Service:  Completed, signed off  If discussed at [employer], dates discussed:    Additional Comments: 06/08/18 @ 1334-Moyses Pavey RNCM-CM consult acknowledged for Outpatient Palliative Care. CM contacted Glyn Ade, [facility] liaison, and made her aware of the consult, with [facility] to follow for additional needs.  Midge Minium RN, BSN, NCM-BC, ACM-RN [phone] 06/08/2018, 12:30 PM

## 2018-06-08 NOTE — Progress Notes (Signed)
Pt rouses easily and denies pain. Tray is at bedside and fod warmed and set up. Pt stet she does not require further assistance.

## 2018-06-08 NOTE — Care Management Obs Status (Signed)
[place] NOTIFICATION   Patient Details  Name: Erica York MRN: [id number] Date of Birth: 01-25-1941   Medicare Observation Status Notification Given:  Yes    Midge Minium RN, BSN, NCM-BC, ACM-RN [phone] 06/08/2018, 11:47 AM

## 2018-06-21 ENCOUNTER — Other Ambulatory Visit: Payer: Self-pay

## 2018-06-21 ENCOUNTER — Encounter (HOSPITAL_COMMUNITY): Payer: Self-pay | Admitting: Emergency Medicine

## 2018-06-21 ENCOUNTER — Inpatient Hospital Stay (HOSPITAL_COMMUNITY)
Admission: EM | Admit: 2018-06-21 | Discharge: 2018-06-30 | DRG: 190 | Disposition: A | Discharge status: Home / Self Care | Payer: Medicare Other | Attending: Internal Medicine | Admitting: Internal Medicine

## 2018-06-21 ENCOUNTER — Emergency Department (HOSPITAL_COMMUNITY): Payer: Medicare Other

## 2018-06-21 DIAGNOSIS — Z79899 Other long term (current) drug therapy: Secondary | ICD-10-CM

## 2018-06-21 DIAGNOSIS — K529 Noninfective gastroenteritis and colitis, unspecified: Secondary | ICD-10-CM | POA: Diagnosis present

## 2018-06-21 DIAGNOSIS — J181 Lobar pneumonia, unspecified organism: Secondary | ICD-10-CM | POA: Diagnosis present

## 2018-06-21 DIAGNOSIS — K449 Diaphragmatic hernia without obstruction or gangrene: Secondary | ICD-10-CM | POA: Diagnosis present

## 2018-06-21 DIAGNOSIS — F325 Major depressive disorder, single episode, in full remission: Secondary | ICD-10-CM | POA: Diagnosis present

## 2018-06-21 DIAGNOSIS — K219 Gastro-esophageal reflux disease without esophagitis: Secondary | ICD-10-CM | POA: Diagnosis present

## 2018-06-21 DIAGNOSIS — K2950 Unspecified chronic gastritis without bleeding: Secondary | ICD-10-CM | POA: Diagnosis present

## 2018-06-21 DIAGNOSIS — E785 Hyperlipidemia, unspecified: Secondary | ICD-10-CM | POA: Diagnosis present

## 2018-06-21 DIAGNOSIS — Z66 Do not resuscitate: Secondary | ICD-10-CM | POA: Diagnosis present

## 2018-06-21 DIAGNOSIS — Z905 Acquired absence of kidney: Secondary | ICD-10-CM | POA: Diagnosis not present

## 2018-06-21 DIAGNOSIS — Z881 Allergy status to other antibiotic agents status: Secondary | ICD-10-CM

## 2018-06-21 DIAGNOSIS — E876 Hypokalemia: Secondary | ICD-10-CM | POA: Diagnosis present

## 2018-06-21 DIAGNOSIS — Z90710 Acquired absence of both cervix and uterus: Secondary | ICD-10-CM

## 2018-06-21 DIAGNOSIS — E440 Moderate protein-calorie malnutrition: Secondary | ICD-10-CM | POA: Diagnosis present

## 2018-06-21 DIAGNOSIS — R9431 Abnormal electrocardiogram [ECG] [EKG]: Secondary | ICD-10-CM

## 2018-06-21 DIAGNOSIS — K56609 Unspecified intestinal obstruction, unspecified as to partial versus complete obstruction: Secondary | ICD-10-CM

## 2018-06-21 DIAGNOSIS — Z681 Body mass index (BMI) 19 or less, adult: Secondary | ICD-10-CM | POA: Diagnosis not present

## 2018-06-21 DIAGNOSIS — M810 Age-related osteoporosis without current pathological fracture: Secondary | ICD-10-CM | POA: Diagnosis present

## 2018-06-21 DIAGNOSIS — F17210 Nicotine dependence, cigarettes, uncomplicated: Secondary | ICD-10-CM | POA: Diagnosis present

## 2018-06-21 DIAGNOSIS — J441 Chronic obstructive pulmonary disease with (acute) exacerbation: Principal | ICD-10-CM | POA: Diagnosis present

## 2018-06-21 DIAGNOSIS — Z85528 Personal history of other malignant neoplasm of kidney: Secondary | ICD-10-CM | POA: Diagnosis not present

## 2018-06-21 DIAGNOSIS — J440 Chronic obstructive pulmonary disease with acute lower respiratory infection: Secondary | ICD-10-CM | POA: Diagnosis present

## 2018-06-21 DIAGNOSIS — D500 Iron deficiency anemia secondary to blood loss (chronic): Secondary | ICD-10-CM | POA: Diagnosis present

## 2018-06-21 DIAGNOSIS — F411 Generalized anxiety disorder: Secondary | ICD-10-CM | POA: Diagnosis present

## 2018-06-21 DIAGNOSIS — K56600 Partial intestinal obstruction, unspecified as to cause: Secondary | ICD-10-CM | POA: Diagnosis present

## 2018-06-21 DIAGNOSIS — G2581 Restless legs syndrome: Secondary | ICD-10-CM | POA: Diagnosis present

## 2018-06-21 DIAGNOSIS — Z9981 Dependence on supplemental oxygen: Secondary | ICD-10-CM | POA: Diagnosis not present

## 2018-06-21 DIAGNOSIS — Z8551 Personal history of malignant neoplasm of bladder: Secondary | ICD-10-CM

## 2018-06-21 DIAGNOSIS — Z4659 Encounter for fitting and adjustment of other gastrointestinal appliance and device: Secondary | ICD-10-CM

## 2018-06-21 DIAGNOSIS — Z7951 Long term (current) use of inhaled steroids: Secondary | ICD-10-CM

## 2018-06-21 DIAGNOSIS — J9621 Acute and chronic respiratory failure with hypoxia: Secondary | ICD-10-CM | POA: Diagnosis present

## 2018-06-21 DIAGNOSIS — R1030 Lower abdominal pain, unspecified: Secondary | ICD-10-CM | POA: Diagnosis not present

## 2018-06-21 DIAGNOSIS — Z9049 Acquired absence of other specified parts of digestive tract: Secondary | ICD-10-CM

## 2018-06-21 LAB — COMPREHENSIVE METABOLIC PANEL
ALT: 7 U/L (ref 0–44)
AST: 16 U/L (ref 15–41)
Albumin: 3.5 g/dL (ref 3.5–5.0)
Alkaline Phosphatase: 106 U/L (ref 38–126)
Anion gap: 11 (ref 5–15)
BUN: 11 mg/dL (ref 8–23)
CO2: 29 mmol/L (ref 22–32)
CREATININE: 0.81 mg/dL (ref 0.44–1.00)
Calcium: 8.9 mg/dL (ref 8.9–10.3)
Chloride: 99 mmol/L (ref 98–111)
Glucose, Bld: 137 mg/dL — ABNORMAL HIGH (ref 70–99)
Potassium: 3.5 mmol/L (ref 3.5–5.1)
Sodium: 139 mmol/L (ref 135–145)
Total Bilirubin: 0.8 mg/dL (ref 0.3–1.2)
Total Protein: 7.4 g/dL (ref 6.5–8.1)

## 2018-06-21 LAB — CBC WITH DIFFERENTIAL/PLATELET
ABS IMMATURE GRANULOCYTES: 0.1 10*3/uL — AB (ref 0.00–0.07)
BASOS PCT: 0 %
Basophils Absolute: 0 10*3/uL (ref 0.0–0.1)
Eosinophils Absolute: 0.1 10*3/uL (ref 0.0–0.5)
Eosinophils Relative: 0 %
HCT: 36.2 % (ref 36.0–46.0)
HEMOGLOBIN: 10.7 g/dL — AB (ref 12.0–15.0)
IMMATURE GRANULOCYTES: 1 %
LYMPHS ABS: 0.6 10*3/uL — AB (ref 0.7–4.0)
LYMPHS PCT: 4 %
MCH: 25.9 pg — ABNORMAL LOW (ref 26.0–34.0)
MCHC: 29.6 g/dL — ABNORMAL LOW (ref 30.0–36.0)
MCV: 87.7 fL (ref 80.0–100.0)
MONO ABS: 0.6 10*3/uL (ref 0.1–1.0)
MONOS PCT: 4 %
NEUTROS ABS: 13.5 10*3/uL — AB (ref 1.7–7.7)
NEUTROS PCT: 91 %
Platelets: 250 10*3/uL (ref 150–400)
RBC: 4.13 MIL/uL (ref 3.87–5.11)
RDW: 20.6 % — ABNORMAL HIGH (ref 11.5–15.5)
WBC: 14.8 10*3/uL — AB (ref 4.0–10.5)
nRBC: 0 % (ref 0.0–0.2)

## 2018-06-21 LAB — I-STAT TROPONIN, ED: Troponin i, poc: 0 ng/mL (ref 0.00–0.08)

## 2018-06-21 LAB — BRAIN NATRIURETIC PEPTIDE: B Natriuretic Peptide: 215.8 pg/mL — ABNORMAL HIGH (ref 0.0–100.0)

## 2018-06-21 LAB — I-STAT CG4 LACTIC ACID, ED
LACTIC ACID, VENOUS: 1.06 mmol/L (ref 0.5–1.9)
LACTIC ACID, VENOUS: 1.96 mmol/L — AB (ref 0.5–1.9)

## 2018-06-21 LAB — LIPASE, BLOOD: LIPASE: 21 U/L (ref 11–51)

## 2018-06-21 MED ORDER — ALBUTEROL (5 MG/ML) CONTINUOUS INHALATION SOLN: 10.0000 mg/h | INHALATION_SOLUTION | RESPIRATORY_TRACT | Status: AC

## 2018-06-21 MED ORDER — DOXYCYCLINE HYCLATE 100 MG PO TABS
100.0000 mg | ORAL_TABLET | Freq: Once | ORAL | Status: AC
  Administered 2018-06-21: 100 mg via ORAL
  Filled 2018-06-21: qty 1

## 2018-06-21 MED ORDER — ALPRAZOLAM 0.5 MG PO TABS
0.5000 mg | ORAL_TABLET | Freq: Two times a day (BID) | ORAL | Status: DC | PRN
  Administered 2018-06-21 – 2018-06-25 (×5): 0.5 mg via ORAL
  Filled 2018-06-21 (×5): qty 1

## 2018-06-21 MED ORDER — SERTRALINE HCL 50 MG PO TABS
100.0000 mg | ORAL_TABLET | Freq: Every day | ORAL | Status: DC
  Administered 2018-06-21 – 2018-06-24 (×4): 100 mg via ORAL
  Filled 2018-06-21 (×4): qty 2

## 2018-06-21 MED ORDER — PREDNISONE 20 MG PO TABS
40.0000 mg | ORAL_TABLET | Freq: Every day | ORAL | Status: DC
  Filled 2018-06-21: qty 2

## 2018-06-21 MED ORDER — IPRATROPIUM-ALBUTEROL 0.5-2.5 (3) MG/3ML IN SOLN
3.0000 mL | RESPIRATORY_TRACT | Status: DC
  Administered 2018-06-21: 3 mL via RESPIRATORY_TRACT
  Filled 2018-06-21: qty 3

## 2018-06-21 MED ORDER — IPRATROPIUM-ALBUTEROL 0.5-2.5 (3) MG/3ML IN SOLN
3.0000 mL | Freq: Once | RESPIRATORY_TRACT | Status: AC
  Administered 2018-06-21: 3 mL via RESPIRATORY_TRACT
  Filled 2018-06-21: qty 3

## 2018-06-21 MED ORDER — TRAMADOL-ACETAMINOPHEN 37.5-325 MG PO TABS
1.0000 | ORAL_TABLET | Freq: Four times a day (QID) | ORAL | Status: DC | PRN
  Administered 2018-06-22 – 2018-06-24 (×5): 1 via ORAL
  Filled 2018-06-21 (×8): qty 1

## 2018-06-21 MED ORDER — ENOXAPARIN SODIUM 40 MG/0.4ML ~~LOC~~ SOLN
40.0000 mg | SUBCUTANEOUS | Status: DC
  Administered 2018-06-22: 40 mg via SUBCUTANEOUS
  Filled 2018-06-21: qty 0.4

## 2018-06-21 MED ORDER — AZITHROMYCIN 250 MG PO TABS
250.0000 mg | ORAL_TABLET | Freq: Every day | ORAL | Status: AC
  Administered 2018-06-22 – 2018-06-24 (×3): 250 mg via ORAL
  Filled 2018-06-21 (×3): qty 1

## 2018-06-21 MED ORDER — SODIUM CHLORIDE 0.9 % IV SOLN
500.0000 mg | Freq: Once | INTRAVENOUS | Status: AC
  Administered 2018-06-21: 500 mg via INTRAVENOUS
  Filled 2018-06-21: qty 500

## 2018-06-21 MED ORDER — ROPINIROLE HCL 1 MG PO TABS
2.0000 mg | ORAL_TABLET | Freq: Every day | ORAL | Status: DC
  Administered 2018-06-21 – 2018-06-25 (×5): 2 mg via ORAL
  Filled 2018-06-21 (×5): qty 2

## 2018-06-21 MED ORDER — METHYLPREDNISOLONE SODIUM SUCC 125 MG IJ SOLR
125.0000 mg | Freq: Once | INTRAMUSCULAR | Status: AC
  Administered 2018-06-21: 125 mg via INTRAVENOUS
  Filled 2018-06-21: qty 2

## 2018-06-21 MED ORDER — SODIUM CHLORIDE 0.9 % IV SOLN
1.0000 g | Freq: Every day | INTRAVENOUS | Status: AC
  Administered 2018-06-21 – 2018-06-22 (×2): 1 g via INTRAVENOUS
  Filled 2018-06-21 (×2): qty 1

## 2018-06-21 MED ORDER — IPRATROPIUM-ALBUTEROL 0.5-2.5 (3) MG/3ML IN SOLN
3.0000 mL | Freq: Four times a day (QID) | RESPIRATORY_TRACT | Status: DC
  Administered 2018-06-22 – 2018-06-23 (×7): 3 mL via RESPIRATORY_TRACT
  Filled 2018-06-21 (×7): qty 3

## 2018-06-21 MED ORDER — ALBUTEROL SULFATE (2.5 MG/3ML) 0.083% IN NEBU: 2.5000 mg | INHALATION_SOLUTION | Freq: Four times a day (QID) | RESPIRATORY_TRACT | Status: DC | PRN

## 2018-06-21 MED ORDER — SODIUM CHLORIDE 0.9 % IV BOLUS
500.0000 mL | Freq: Once | INTRAVENOUS | Status: AC
  Administered 2018-06-21: 500 mL via INTRAVENOUS

## 2018-06-21 MED ORDER — ALBUTEROL (5 MG/ML) CONTINUOUS INHALATION SOLN
10.0000 mg/h | INHALATION_SOLUTION | Freq: Once | RESPIRATORY_TRACT | Status: AC
  Administered 2018-06-21: 10 mg/h via RESPIRATORY_TRACT

## 2018-06-21 MED ORDER — ALBUTEROL (5 MG/ML) CONTINUOUS INHALATION SOLN
10.0000 mg/h | INHALATION_SOLUTION | RESPIRATORY_TRACT | Status: DC
  Administered 2018-06-21: 10 mg/h via RESPIRATORY_TRACT
  Filled 2018-06-21: qty 20

## 2018-06-21 NOTE — ED Notes (Signed)
Patient transported to X-ray 

## 2018-06-21 NOTE — ED Notes (Signed)
Bed: WA07 Expected date: 06/21/18 Expected time: 2:46 PM Means of arrival: Ambulance Comments: COPD, o2 sat 89% cough

## 2018-06-21 NOTE — ED Notes (Signed)
ED Provider at bedside. 

## 2018-06-21 NOTE — ED Notes (Addendum)
Respiratory made aware of breathing treatment. 

## 2018-06-21 NOTE — ED Triage Notes (Signed)
Per EMS, patient from home, c/o SOB with cough since last night with nausea today. Hx COPD. 2L O2 at home. Ambulatory.  18g L arm

## 2018-06-21 NOTE — ED Notes (Signed)
Family at bedside. 

## 2018-06-21 NOTE — ED Notes (Signed)
ED TO INPATIENT HANDOFF REPORT  Name/Age/Gender Erica York 77 y.o. female  Code Status    Code Status Orders  (From admission, onward)         Start     Ordered   06/21/18 1911  Do not attempt resuscitation (DNR)  Continuous    Question Answer Comment  In the event of cardiac or respiratory ARREST Do not call a "code blue"   In the event of cardiac or respiratory ARREST Do not perform Intubation, CPR, defibrillation or ACLS   In the event of cardiac or respiratory ARREST Use medication by any route, position, wound care, and other measures to relive pain and suffering. May use oxygen, suction and manual treatment of airway obstruction as needed for comfort.      06/21/18 1917        Code Status History    Date Active Date Inactive Code Status Order ID Comments User Context   06/08/2018 1326 06/08/2018 2159 DNR [id number]  Jimmy Footman, NP Inpatient   06/07/2018 0018 06/08/2018 1326 Full Code [id number]  Jani Gravel, MD ED   04/23/2018 0017 04/25/2018 2028 Full Code [id number]  Etta Quill, DO ED   10/07/2017 0336 10/12/2017 1538 Full Code [id number]  Bethena Roys, MD Inpatient   08/01/2017 1349 08/08/2017 1417 Full Code [id number]  Erick Colace, NP ED    Advance Directive Documentation     Most Recent Value  Type of Advance Directive  Healthcare Power of Attorney, Living will  Pre-existing out of facility DNR order (yellow form or pink MOST form)  -  "MOST" Form in Place?  -      Home/SNF/Other Home  Chief Complaint coughing,shortness of breath, nausea  Level of Care/Admitting Diagnosis ED Disposition    ED Disposition Condition [place]: [place] [[id number]]  Level of Care: Telemetry [5]  Admit to tele based on following criteria: Other see comments  Comments: sats monitor  Diagnosis: COPD exacerbation [facility]) [[id number]]  Admitting Physician: Colbert Ewing [[id number]]  Attending Physician: Loletta Parish  Estimated length of stay: past midnight tomorrow  Certification:: I certify this patient will need inpatient services for at least 2 midnights  PT Class (Do Not Modify): Inpatient [101]  PT Acc Code (Do Not Modify): Private [1]       Medical History Past Medical History:  Diagnosis Date  . Anemia   . Arthritis    hands  . Bladder cancer ([place])   . Chronic diarrhea   . Chronic gastritis   . COPD (chronic obstructive pulmonary disease) ([place])    per pt uses neb. as needed  . Depression   . Diverticulosis of colon   . Duodenal ulcer 08/01/2017   multiple per EGD  . GAD (generalized anxiety disorder)   . GERD (gastroesophageal reflux disease)   . Hiatal hernia   . History of bladder cancer 2017   s/p  TURBT's twice   . History of colonic diverticulitis   . History of GI bleed 08/01/2017   and 10-06-2017 upper GI bleed due to multiple duonenal ulcer , duodenitis , gastritis (transfused 10-06-2017)  . History of renal cell carcinoma    dx 2016---  s/p  left nephrectomy  . Hyperlipidemia   . Osteoporosis   . RLS (restless legs syndrome)   . 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 Urge incontinence of urine     Allergies  Allergies  Allergen Reactions  . Zosyn [Piperacillin Sod-Tazobactam So] Swelling and Rash    IV Location/Drains/Wounds Patient Lines/Drains/Airways Status   Active Line/Drains/Airways    Name:   Placement date:   Placement time:   Site:   Days:   Peripheral IV 06/21/18 Left Antecubital   06/21/18    -    Antecubital   less than 1          Labs/Imaging Results for orders placed or performed during the hospital encounter of 06/21/18 (from the past 48 hour(s))  Comprehensive metabolic panel     Status: Abnormal   Collection Time: 06/21/18  3:42 PM  Result Value Ref Range   Sodium 139 135 - 145 mmol/L   Potassium 3.5 3.5 - 5.1 mmol/L   Chloride 99 98 - 111 mmol/L   CO2 29 22 - 32 mmol/L   Glucose, Bld 137 (H) 70  - 99 mg/dL   BUN 11 8 - 23 mg/dL   Creatinine, Ser 0.81 0.44 - 1.00 mg/dL   Calcium 8.9 8.9 - 10.3 mg/dL   Total Protein 7.4 6.5 - 8.1 g/dL   Albumin 3.5 3.5 - 5.0 g/dL   AST 16 15 - 41 U/L   ALT 7 0 - 44 U/L   Alkaline Phosphatase 106 38 - 126 U/L   Total Bilirubin 0.8 0.3 - 1.2 mg/dL   GFR calc non Af Amer >60 >60 mL/min   GFR calc Af Amer >60 >60 mL/min    Comment: (NOTE) The eGFR has been calculated using the CKD EPI equation. This calculation has not been validated in all clinical situations. eGFR's persistently <60 mL/min signify possible Chronic Kidney Disease.    Anion gap 11 5 - 15    Comment: Performed at [facility], [place] [address], [place], [place] [address]  Lipase, blood     Status: None   Collection Time: 06/21/18  3:42 PM  Result Value Ref Range   Lipase 21 11 - 51 U/L    Comment: Performed at [facility], [place] [address], [place], [place] [address]  CBC with Differential     Status: Abnormal   Collection Time: 06/21/18  3:42 PM  Result Value Ref Range   WBC 14.8 (H) 4.0 - 10.5 K/uL   RBC 4.13 3.87 - 5.11 MIL/uL   Hemoglobin 10.7 (L) 12.0 - 15.0 g/dL   HCT 36.2 36.0 - 46.0 %   MCV 87.7 80.0 - 100.0 fL   MCH 25.9 (L) 26.0 - 34.0 pg   MCHC 29.6 (L) 30.0 - 36.0 g/dL   RDW 20.6 (H) 11.5 - 15.5 %   Platelets 250 150 - 400 K/uL   nRBC 0.0 0.0 - 0.2 %   Neutrophils Relative % 91 %   Neutro Abs 13.5 (H) 1.7 - 7.7 K/uL   Lymphocytes Relative 4 %   Lymphs Abs 0.6 (L) 0.7 - 4.0 K/uL   Monocytes Relative 4 %   Monocytes Absolute 0.6 0.1 - 1.0 K/uL   Eosinophils Relative 0 %   Eosinophils Absolute 0.1 0.0 - 0.5 K/uL   Basophils Relative 0 %   Basophils Absolute 0.0 0.0 - 0.1 K/uL   RBC Morphology MORPHOLOGY UNREMARKABLE    Immature Granulocytes 1 %   Abs Immature Granulocytes 0.10 (H) 0.00 - 0.07 K/uL    Comment: Performed at [facility], [place] [address], [place], [place] [address]  Brain natriuretic  peptide     Status: Abnormal  Collection Time: 06/21/18  3:42 PM  Result Value Ref Range   B Natriuretic Peptide 215.8 (H) 0.0 - 100.0 pg/mL    Comment: Performed at [facility], [place] [address], [place], [place] [address]  I-stat troponin, ED     Status: None   Collection Time: 06/21/18  3:47 PM  Result Value Ref Range   Troponin i, poc 0.00 0.00 - 0.08 ng/mL   Comment 3            Comment: Due to the release kinetics of cTnI, a negative result within the first hours of the onset of symptoms does not rule out myocardial infarction with certainty. If myocardial infarction is still suspected, repeat the test at appropriate intervals.   I-Stat CG4 Lactic Acid, ED     Status: None   Collection Time: 06/21/18  3:48 PM  Result Value Ref Range   Lactic Acid, Venous 1.06 0.5 - 1.9 mmol/L  I-Stat CG4 Lactic Acid, ED     Status: Abnormal   Collection Time: 06/21/18  5:48 PM  Result Value Ref Range   Lactic Acid, Venous 1.96 (H) 0.5 - 1.9 mmol/L   Dg Chest 2 View  Result Date: 06/21/2018 CLINICAL DATA:  Shortness of breath. EXAM: CHEST - 2 VIEW COMPARISON:  Body CT 06/06/2018 FINDINGS: Cardiomediastinal silhouette is normal. Mediastinal contours appear intact. 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 There is no evidence of pleural effusion or pneumothorax. Marked bilateral emphysema and hyperinflation of the lungs. Linear right middle pulmonary lobe opacity. Osseous structures are without acute abnormality. Soft tissues are grossly normal. IMPRESSION: Marked emphysematous changes of the lungs. Linear right middle lobe opacity may represent atelectasis or airspace consolidation. 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 Electronically Signed   By: Fidela Salisbury M.D.   On: 06/21/2018 15:42   EKG Interpretation  Date/Time:  Sunday June 21 2018 15:10:08 EST Ventricular Rate:  86 PR Interval:    QRS Duration: 77 QT  Interval:  413 QTC Calculation: 494 R Axis:   40 Text Interpretation:  Sinus rhythm Abnormal R-wave progression, early transition Minimal ST depression, lateral leads Borderline prolonged QT interval no significant change from prior 10/19 Confirmed by Butler, Michael ([id number]) on 06/21/2018 3:27:17 PM   Pending Labs Unresulted Labs (From admission, onward)    Start     Ordered   06/28/18 0500  Creatinine, serum  (enoxaparin (LOVENOX)    CrCl >/= 30 ml/min)  Weekly,   R    Comments:  while on enoxaparin therapy    06/21/18 1917   06/22/18 0500  Basic metabolic panel  Daily,   R     06/21/18 1917   06/22/18 0500  CBC with Differential/Platelet  Daily,   R     06/21/18 1917   06/22/18 0500  Magnesium  Daily,   R     11 /03/19 1917   06/21/18 1909  CBC  (enoxaparin (LOVENOX)    CrCl >/= 30 ml/min)  Once,   R    Comments:  Baseline for enoxaparin therapy IF NOT ALREADY DRAWN.  Notify MD if PLT < 100 K.    06/21/18 1917   06/21/18 1909  Creatinine, serum  (enoxaparin (LOVENOX)    CrCl >/= 30 ml/min)  Once,   R    Comments:  Baseline for enoxaparin therapy IF NOT ALREADY DRAWN.    06/21/18 1917   06/21/18 1710  Culture, blood (routine x 2)  BLOOD CULTURE X 2,  STAT     06/21/18 1709          Vitals/Pain Today's Vitals   06/21/18 1830 06/21/18 1900 06/21/18 1930 06/21/18 2000  BP: 131/61 139/63 (!) 150/68 128/71  Pulse: (!) 104 (!) 105 (!) 111 (!) 121  Resp: 18 (!) 23 (!) 22 (!) 23  Temp:      TempSrc:      SpO2: 94% 95% 100% 100%  PainSc:        Isolation Precautions No active isolations  Medications Medications  ALPRAZolam (XANAX) tablet 0.5 mg (has no administration in time range)  rOPINIRole (REQUIP) tablet 2 mg (has no administration in time range)  sertraline (ZOLOFT) tablet 100 mg (has no administration in time range)  traMADol-acetaminophen (ULTRACET) 37.5-325 MG per tablet 1 tablet (has no administration in time range)  enoxaparin (LOVENOX) injection 40 mg (has no  administration in time range)  albuterol (PROVENTIL,VENTOLIN) solution continuous neb (10 mg/hr Nebulization Not Given 06/21/18 1928)  azithromycin (ZITHROMAX) 500 mg in sodium chloride 0.9 % 250 mL IVPB (has no administration in time range)  ipratropium-albuterol (DUONEB) 0.5-2.5 (3) MG/3ML nebulizer solution 3 mL (3 mLs Nebulization Not Given 06/21/18 2010)  azithromycin (ZITHROMAX) tablet 250 mg (has no administration in time range)  cefTRIAXone (ROCEPHIN) 1 g in sodium chloride 0.9 % 100 mL IVPB (has no administration in time range)  ipratropium-albuterol (DUONEB) 0.5-2.5 (3) MG/3ML nebulizer solution 3 mL (3 mLs Nebulization Given 06/21/18 1536)  methylPREDNISolone sodium succinate (SOLU-MEDROL) 125 mg/2 mL injection 125 mg (125 mg Intravenous Given 06/21/18 1620)  doxycycline (VIBRA-TABS) tablet 100 mg (100 mg Oral Given 06/21/18 1659)  sodium chloride 0.9 % bolus 500 mL (500 mLs Intravenous New Bag/Given 06/21/18 1902)  albuterol (PROVENTIL,VENTOLIN) solution continuous neb (10 mg/hr Nebulization Given 06/21/18 1857)    Mobility walks

## 2018-06-21 NOTE — Progress Notes (Signed)
Contacted [facility] rep, Dorian Pod to make aware of ED admission. Pt active with HH RN, PT, OT, aide and Tele Monitoring. Jonnie Finner RN CCM Case Mgmt phone [phone]

## 2018-06-21 NOTE — ED Provider Notes (Signed)
[facility] DEPT Provider Note   CSN: [id number] Arrival date & time: 06/21/18  1448     History   Chief Complaint Chief Complaint  Patient presents with  . Cough  . Shortness of Breath    HPI Erica York is a 77 y.o. female with a past medical history of COPD, GERD, chronic gastritis and diarrhea, history of renal cell carcinoma status post left nephrectomy, anemia due to GI bleed, who presents today for evaluation of shortness of breath.  She reports that since last night she has gradually had a harder and harder time catching her breath.  She is usually on 2 L of oxygen at home.  She has increased cough, increased sputum production, and sputum has changed from tan-colored to white.  She reports that she has not run out of any of her medications, and is taking breathing treatments with mild relief.  She denies any fevers.  She does report nausea without vomiting or diarrhea.  No chest pain.  She reports her usual chronic abdominal pain with no new pain. No urinary symptoms.   She was recently admitted from 10/19-10/21 for multiple syncopal events which was felt to be secondary to medications.    HPI  Past Medical History:  Diagnosis Date  . Anemia   . Arthritis    hands  . Bladder cancer ([place])   . Chronic diarrhea   . Chronic gastritis   . COPD (chronic obstructive pulmonary disease) ([place])    per pt uses neb. as needed  . Depression   . Diverticulosis of colon   . Duodenal ulcer 08/01/2017   multiple per EGD  . GAD (generalized anxiety disorder)   . GERD (gastroesophageal reflux disease)   . Hiatal hernia   . History of bladder cancer 2017   s/p  TURBT's twice   . History of colonic diverticulitis   . History of GI bleed 08/01/2017   and 10-06-2017 upper GI bleed due to multiple duonenal ulcer , duodenitis , gastritis (transfused 10-06-2017)  . History of renal cell carcinoma    dx 2016---  s/p  left nephrectomy  . Hyperlipidemia     . Osteoporosis   . RLS (restless legs syndrome)   . 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 Urge incontinence of urine     Patient Active Problem List   Diagnosis Date Noted  . COPD exacerbation ([place]) 06/21/2018  . Syncope 06/06/2018  . Tobacco abuse   . History of bladder cancer 04/23/2018  . History of renal cell cancer 04/23/2018  . Anemia 04/23/2018  . GI bleed 04/23/2018  . Acute blood loss anemia   . Hypokalemia   . COPD (chronic obstructive pulmonary disease) ([place])   . Chronic nonintractable headache   . Symptomatic anemia 10/07/2017  . Duodenal ulcer 10/07/2017  . Malnutrition of moderate degree 10/07/2017  . Anemia due to GI blood loss 10/06/2017  . Protein-calorie malnutrition, severe 08/07/2017  . Duodenitis 08/03/2017  . Blood loss anemia 08/03/2017    Past Surgical History:  Procedure Laterality Date  . ABDOMINAL HYSTERECTOMY  1973   w/ BSO and Appendectomy  . COLON SURGERY  2015   in [place]   "my colon was reconstructed"  . ESOPHAGOGASTRODUODENOSCOPY N/A 08/06/2017   Procedure: ESOPHAGOGASTRODUODENOSCOPY (EGD);  Surgeon: Clarene Essex, MD;  Location: [facility];  Service: Endoscopy;  Laterality: N/A;  . ESOPHAGOGASTRODUODENOSCOPY (EGD) WITH PROPOFOL Left 10/08/2017   Procedure: ESOPHAGOGASTRODUODENOSCOPY (EGD) WITH PROPOFOL;  Surgeon: Arta Silence, MD;  Location: [facility];  Service: Endoscopy;  Laterality: Left;  . HEMICOLECTOMY  x3  last one 2010   last one w/ ostomy (diverticulitis)  . INGUINAL HERNIA REPAIR Left 1980s  . LAPAROSCOPIC CHOLECYSTECTOMY  1990s  . LEFT NEPHRECTOMY  2016   in [place]   renal cell carcinoma  . TRANSURETHRAL RESECTION OF BLADDER TUMOR  x2  2017  in [place], [place]  . TRANSURETHRAL RESECTION OF BLADDER TUMOR N/A 03/18/2018   Procedure: TRANSURETHRAL RESECTION OF BLADDER TUMOR (TURBT), WITH CYSTOSCOPY, CYSTOGRAM, INSTILATION OF GEMCITABINE;  Surgeon: Ceasar Mons, MD;  Location: [facility];  Service: Urology;  Laterality: N/A;     OB History   None      Home Medications    Prior to Admission medications   Medication Sig Start Date End Date Taking? Authorizing Provider  albuterol (PROVENTIL HFA;VENTOLIN HFA) 108 (90 Base) MCG/ACT inhaler Inhale 2 puffs into the lungs every 6 (six) hours as needed for wheezing or shortness of breath. 04/25/18  Yes Patrecia Pour, Christean Grief, MD  ALPRAZolam Duanne Moron) 0.5 MG tablet Take 1 tablet (0.5 mg total) by mouth 2 (two) times daily as needed for anxiety. 04/25/18  Yes Patrecia Pour, Christean Grief, MD  iron polysaccharides (NIFEREX) 150 MG capsule Take 1 capsule (150 mg total) by mouth daily. 04/26/18  Yes Patrecia Pour, Christean Grief, MD  mometasone-formoterol [facility]) 200-5 MCG/ACT AERO Inhale 2 puffs into the lungs 2 (two) times daily. 04/25/18  Yes Patrecia Pour, Christean Grief, MD  pantoprazole (PROTONIX) 40 MG tablet Take 1 tablet (40 mg total) by mouth 2 (two) times daily before a meal. After 1 month take 1 daily in the morning Patient taking differently: Take 40 mg by mouth daily.  04/25/18  Yes Patrecia Pour, Christean Grief, MD  propranolol (INDERAL) 40 MG tablet Take 1 tablet (40 mg total) by mouth 2 (two) times daily. 04/25/18  Yes Patrecia Pour, Christean Grief, MD  rOPINIRole (REQUIP) 2 MG tablet Take 2 mg by mouth at bedtime.   Yes [provider]  sertraline (ZOLOFT) 100 MG tablet Take 1 tablet (100 mg total) by mouth at bedtime. 08/08/17  Yes Caren Griffins, MD  traMADol-acetaminophen (ULTRACET) 37.5-325 MG tablet Take 1 tablet by mouth every 6 (six) hours as needed for moderate pain. for pain 06/04/18  Yes [provider]  dextromethorphan-guaiFENesin (MUCINEX DM) 30-600 MG 12hr tablet Take 1 tablet by mouth 2 (two) times daily as needed for cough. Patient not taking: Reported on 06/06/2018 04/25/18   Patrecia Pour, Christean Grief, MD  sucralfate (CARAFATE) 1 GM/10ML suspension Take 10 mLs (1 g total) by mouth 4 (four) times daily -  with meals and at  bedtime. Patient not taking: Reported on 06/06/2018 04/25/18   Patrecia Pour, Christean Grief, MD  tiotropium (SPIRIVA HANDIHALER) 18 MCG inhalation capsule Place 1 capsule (18 mcg total) into inhaler and inhale daily. Patient not taking: Reported on 06/06/2018 04/25/18 04/25/19  Doreatha Lew, MD    Family History History reviewed. No pertinent family history.  Social History Social History   Tobacco Use  . Smoking status: Current Some Day Smoker    Years: 60.00    Types: Cigarettes  . Smokeless tobacco: Never Used  . Tobacco comment: 03-16-2018  per pt smokes on 2 wks off 2wks and when smokes 1/5ppd  Substance Use Topics  . Alcohol use: No    Frequency: Never  . Drug use: No     Allergies   Zosyn [piperacillin sod-tazobactam  so]   Review of Systems Review of Systems  Constitutional: Negative for chills and fever.  HENT: Negative for congestion, nosebleeds, postnasal drip, rhinorrhea, sinus pressure and sinus pain.   Respiratory: Positive for cough and shortness of breath.   Gastrointestinal: Positive for abdominal pain (Chronic, unchanged) and nausea. Negative for constipation, diarrhea and vomiting.  Genitourinary: Negative for dysuria, flank pain, hematuria and urgency.  Musculoskeletal: Negative for back pain and neck pain.  Skin: Negative for color change and rash.  Neurological: Positive for weakness (Generalized). Negative for headaches.  All other systems reviewed and are negative.    Physical Exam Updated Vital Signs BP 120/65 (BP Location: Right Arm)   Pulse (!) 113   Temp 98.5 F (36.9 C) (Oral)   Resp (!) 24   Ht 4\' 11"  (1.499 m)   Wt 41.7 kg   SpO2 93%   BMI 18.57 kg/m   Physical Exam  Constitutional: She is oriented to person, place, and time.  Chronically ill-appearing, malnourished, frail appearing  HENT:  Head: Normocephalic and atraumatic.  Mouth/Throat: Oropharynx is clear and moist.  Eyes: Conjunctivae are normal.  Neck: Normal range of motion.  Neck supple.  Cardiovascular: Normal rate, regular rhythm and normal heart sounds.  No murmur heard. Pulmonary/Chest: She has no decreased breath sounds. She has no wheezes. She has rhonchi in the right upper field, the right middle field, the right lower field, the left upper field, the left middle field and the left lower field. She has no rales.  Mildly increased work of breathing.   Abdominal: Soft. There is no tenderness.  Musculoskeletal: She exhibits no edema.       Right lower leg: Normal.       Left lower leg: Normal.  Neurological: She is alert and oriented to person, place, and time.  Skin: Skin is warm and dry.  Psychiatric: She has a normal mood and affect.  Nursing note and vitals reviewed.    ED Treatments / Results  Labs (all labs ordered are listed, but only abnormal results are displayed) Labs Reviewed  COMPREHENSIVE METABOLIC PANEL - Abnormal; Notable for the following components:      Result Value   Glucose, Bld 137 (*)    All other components within normal limits  CBC WITH DIFFERENTIAL/PLATELET - Abnormal; Notable for the following components:   WBC 14.8 (*)    Hemoglobin 10.7 (*)    MCH 25.9 (*)    MCHC 29.6 (*)    RDW 20.6 (*)    Neutro Abs 13.5 (*)    Lymphs Abs 0.6 (*)    Abs Immature Granulocytes 0.10 (*)    All other components within normal limits  BRAIN NATRIURETIC PEPTIDE - Abnormal; Notable for the following components:   B Natriuretic Peptide 215.8 (*)    All other components within normal limits  I-STAT CG4 LACTIC ACID, ED - Abnormal; Notable for the following components:   Lactic Acid, Venous 1.96 (*)    All other components within normal limits  CULTURE, BLOOD (ROUTINE X 2)  CULTURE, BLOOD (ROUTINE X 2)  LIPASE, BLOOD  BASIC METABOLIC PANEL  CBC WITH DIFFERENTIAL/PLATELET  MAGNESIUM  I-STAT TROPONIN, ED  I-STAT CG4 LACTIC ACID, ED    EKG EKG Interpretation  Date/Time:  Sunday June 21 2018 15:10:08 EST Ventricular Rate:   86 PR Interval:    QRS Duration: 77 QT Interval:  413 QTC Calculation: 494 R Axis:   40 Text Interpretation:  Sinus rhythm Abnormal R-wave progression, early  transition Minimal ST depression, lateral leads Borderline prolonged QT interval no significant change from prior 10/19 Confirmed by Aletta Edouard [phone]) on 06/21/2018 3:27:17 PM   Radiology Dg Chest 2 View  Result Date: 06/21/2018 CLINICAL DATA:  Shortness of breath. EXAM: CHEST - 2 VIEW COMPARISON:  Body CT 06/06/2018 FINDINGS: Cardiomediastinal silhouette is normal. Mediastinal contours appear intact. 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 There is no evidence of pleural effusion or pneumothorax. Marked bilateral emphysema and hyperinflation of the lungs. Linear right middle pulmonary lobe opacity. Osseous structures are without acute abnormality. Soft tissues are grossly normal. IMPRESSION: Marked emphysematous changes of the lungs. Linear right middle lobe opacity may represent atelectasis or airspace consolidation. 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 Electronically Signed   By: Fidela Salisbury M.D.   On: 06/21/2018 15:42    Procedures Procedures (including critical care time) CRITICAL CARE Performed by: Wyn Quaker Total critical care time: 40 minutes Critical care time was exclusive of separately billable procedures and treating other patients. Critical care was necessary to treat or prevent imminent or life-threatening deterioration. Critical care was time spent personally by me on the following activities: development of treatment plan with patient and/or surrogate as well as nursing, discussions with consultants, evaluation of patient's response to treatment, examination of patient, obtaining history from patient or surrogate, ordering and performing treatments and interventions, ordering and review of laboratory studies, ordering and review of radiographic studies,  pulse oximetry and re-evaluation of patient's condition.   Medications Ordered in ED Medications  ipratropium-albuterol (DUONEB) 0.5-2.5 (3) MG/3ML nebulizer solution 3 mL (3 mLs Nebulization Given 06/21/18 1536)  methylPREDNISolone sodium succinate (SOLU-MEDROL) 125 mg/2 mL injection 125 mg (125 mg Intravenous Given 06/21/18 1620)  doxycycline (VIBRA-TABS) tablet 100 mg (100 mg Oral Given 06/21/18 1659)  sodium chloride 0.9 % bolus 500 mL (500 mLs Intravenous New Bag/Given 06/21/18 1902)  albuterol (PROVENTIL,VENTOLIN) solution continuous neb (10 mg/hr Nebulization Given 06/21/18 1857)  Second albuterol CAT ordered.      Initial Impression / Assessment and Plan / ED Course  I have reviewed the triage vital signs and the nursing notes.  Pertinent labs & imaging results that were available during my care of the patient were reviewed by me and considered in my medical decision making (see chart for details).  Clinical Course as of Jun 23 103  Sun Jun 21, 2018  1521 Patient was 89% on her usual 2 lpm.  Increased to 4lpm which increased sats to high 90s.     [EH]  1548 Patient reevaluated after DuoNeb, she is moving more air and now has increased wheezing.  After consultation with respiratory will order a hour-long cat   [EH]  2471 77 year old female with significant COPD here with increased shortness of breath.  She is in moderate respiratory distress and diffuse wheezing.  She is getting some breathing treatments now.  Labs chest x-ray EKG steroids and likely will need admission.   [MB]  1621 QTC 494, Patient needs albuterol.  Will give albuterol.  Will also give doxycycline as FQ would worsen QT prolongation.  She has a listed zosyn allergy.    EKG 12-Lead [EH]    Clinical Course User Index [EH] Lorin Glass, PA-C [MB] Hayden Rasmussen, MD   Patient presents today for evaluation of cough, shortness of breath.  She has increased sputum production, increased sputum purulence in  addition to increased cough.  Her chest x-ray did not show any evidence of pneumonia or  consolidation.  Lungs with diffuse rhonchi.  EKG was obtained showing a prolonged QTC, and patient also has allergy to Zosyn/penicillins, therefore she was treated with doxycycline due to concerns of flora quinolones worsening her QT interval.  She was given multiple breathing treatments while in the department with mild improvement.  She was also given steroids in the department.  Given the severity of her exacerbation combined with patient's frail status do not feel that it is safe for discharge.  I spoke with the hospitalist who agreed to admit patient.  This patient was seen as a shared visit with Dr. Melina Copa.   Final Clinical Impressions(s) / ED Diagnoses   Final diagnoses:  COPD exacerbation ([place])  QT prolongation    ED Discharge Orders    None       Ollen Gross 06/22/18 0107    Hayden Rasmussen, MD 06/22/18 1406

## 2018-06-21 NOTE — ED Notes (Signed)
Respiratory made aware of breathing treatment. 

## 2018-06-21 NOTE — ED Notes (Signed)
Family requesting update from PA. PA made aware.

## 2018-06-21 NOTE — H&P (Signed)
History and Physical  Erica York SEG:[id number] DOB: 12-Jun-1941 DOA: 06/21/2018 1451  Referring physician:  Conard Novak PCP: Heywood Bene, PA-C   HISTORY   Chief Complaint: dyspnea and cough  HPI: Erica York is a 77 y.o. female with hx of COPD on home O2 lpm, depression, HTN, hx of RCC, RLS, who presented with progressive dyspnea and productive cough x 3-4 days. Since Thursday, she has been having productive cough yellow to tan colored and for past 2 days have been increasing dyspnea. She reports that she could barely breathe with the assistance of her home albuterol neb last night. Still coughing and producing dark yellow sputum today. History limited by patient's labored breathing. Denies clear fevers or chills.   Review of Systems: + productive cough, dyspnea + mild pleuritic chest tightness with coughing spells - no fevers/chills - no edema, PND, orthopnea - no nausea/vomiting; no tarry, melanotic or bloody stools - no dysuria, increased urinary frequency - no weight changes Rest of systems reviewed are negative, except as per above history.   ED course:  Vitals Blood pressure 128/71, pulse (!) 121, temperature 99.6 F (37.6 C), temperature source Rectal, resp. rate (!) 23, SpO2 100 %. Received duoneb x 1; albuterol continuous nebs; doxycycline 100mg  PO x 1; solumedrol 125mg  x 1   Past Medical History:  Diagnosis Date  . Anemia   . Arthritis    hands  . Bladder cancer ([place])   . Chronic diarrhea   . Chronic gastritis   . COPD (chronic obstructive pulmonary disease) ([place])    per pt uses neb. as needed  . Depression   . Diverticulosis of colon   . Duodenal ulcer 08/01/2017   multiple per EGD  . GAD (generalized anxiety disorder)   . GERD (gastroesophageal reflux disease)   . Hiatal hernia   . History of bladder cancer 2017   s/p  TURBT's twice   . History of colonic diverticulitis   . History of GI bleed 08/01/2017   and 10-06-2017 upper GI bleed  due to multiple duonenal ulcer , duodenitis , gastritis (transfused 10-06-2017)  . History of renal cell carcinoma    dx 2016---  s/p  left nephrectomy  . Hyperlipidemia   . Osteoporosis   . RLS (restless legs syndrome)   . 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 Urge incontinence of urine    Past Surgical History:  Procedure Laterality Date  . ABDOMINAL HYSTERECTOMY  1973   w/ BSO and Appendectomy  . COLON SURGERY  2015   in [place]   "my colon was reconstructed"  . ESOPHAGOGASTRODUODENOSCOPY N/A 08/06/2017   Procedure: ESOPHAGOGASTRODUODENOSCOPY (EGD);  Surgeon: Clarene Essex, MD;  Location: [facility];  Service: Endoscopy;  Laterality: N/A;  . ESOPHAGOGASTRODUODENOSCOPY (EGD) WITH PROPOFOL Left 10/08/2017   Procedure: ESOPHAGOGASTRODUODENOSCOPY (EGD) WITH PROPOFOL;  Surgeon: Arta Silence, MD;  Location: [facility];  Service: Endoscopy;  Laterality: Left;  . HEMICOLECTOMY  x3  last one 2010   last one w/ ostomy (diverticulitis)  . INGUINAL HERNIA REPAIR Left 1980s  . LAPAROSCOPIC CHOLECYSTECTOMY  1990s  . LEFT NEPHRECTOMY  2016   in [place]   renal cell carcinoma  . TRANSURETHRAL RESECTION OF BLADDER TUMOR  x2  2017  in [place], [place]  . TRANSURETHRAL RESECTION OF BLADDER TUMOR N/A 03/18/2018   Procedure: TRANSURETHRAL RESECTION OF BLADDER TUMOR (TURBT), WITH CYSTOSCOPY, CYSTOGRAM, INSTILATION OF GEMCITABINE;  Surgeon: Ceasar Mons, MD;  Location: [facility];  Service: Urology;  Laterality: N/A;    Social History:  reports that she has been smoking cigarettes. She has smoked for the past 60.00 years. She has never used smokeless tobacco. She reports that she does not drink alcohol or use drugs.  Allergies  Allergen Reactions  . Zosyn [Piperacillin Sod-Tazobactam So] Swelling and Rash    No family history on file.    Prior to Admission medications   Medication Sig Start Date End Date Taking? Authorizing Provider    albuterol (PROVENTIL HFA;VENTOLIN HFA) 108 (90 Base) MCG/ACT inhaler Inhale 2 puffs into the lungs every 6 (six) hours as needed for wheezing or shortness of breath. 04/25/18  Yes Patrecia Pour, Christean Grief, MD  ALPRAZolam Duanne Moron) 0.5 MG tablet Take 1 tablet (0.5 mg total) by mouth 2 (two) times daily as needed for anxiety. 04/25/18  Yes Patrecia Pour, Christean Grief, MD  iron polysaccharides (NIFEREX) 150 MG capsule Take 1 capsule (150 mg total) by mouth daily. 04/26/18  Yes Patrecia Pour, Christean Grief, MD  mometasone-formoterol [facility]) 200-5 MCG/ACT AERO Inhale 2 puffs into the lungs 2 (two) times daily. 04/25/18  Yes Patrecia Pour, Christean Grief, MD  pantoprazole (PROTONIX) 40 MG tablet Take 1 tablet (40 mg total) by mouth 2 (two) times daily before a meal. After 1 month take 1 daily in the morning Patient taking differently: Take 40 mg by mouth daily.  04/25/18  Yes Patrecia Pour, Christean Grief, MD  propranolol (INDERAL) 40 MG tablet Take 1 tablet (40 mg total) by mouth 2 (two) times daily. 04/25/18  Yes Patrecia Pour, Christean Grief, MD  rOPINIRole (REQUIP) 2 MG tablet Take 2 mg by mouth at bedtime.   Yes [provider]  sertraline (ZOLOFT) 100 MG tablet Take 1 tablet (100 mg total) by mouth at bedtime. 08/08/17  Yes Caren Griffins, MD  traMADol-acetaminophen (ULTRACET) 37.5-325 MG tablet Take 1 tablet by mouth every 6 (six) hours as needed for moderate pain. for pain 06/04/18  Yes [provider]  dextromethorphan-guaiFENesin (MUCINEX DM) 30-600 MG 12hr tablet Take 1 tablet by mouth 2 (two) times daily as needed for cough. Patient not taking: Reported on 06/06/2018 04/25/18   Patrecia Pour, Christean Grief, MD  sucralfate (CARAFATE) 1 GM/10ML suspension Take 10 mLs (1 g total) by mouth 4 (four) times daily -  with meals and at bedtime. Patient not taking: Reported on 06/06/2018 04/25/18   Patrecia Pour, Christean Grief, MD  tiotropium (SPIRIVA HANDIHALER) 18 MCG inhalation capsule Place 1 capsule (18 mcg total) into inhaler and inhale daily. Patient not  taking: Reported on 06/06/2018 04/25/18 04/25/19  Patrecia Pour, Christean Grief, MD    PHYSICAL EXAM   Temp:  [phone] F (36.7 C)-99.6 F (37.6 C)] 99.6 F (37.6 C) (11/03 1619) Pulse Rate:  [85-121] 121 (11/03 2000) Resp:  [18-26] 23 (11/03 2000) BP: (115-150)/(61-78) 128/71 (11/03 2000) SpO2:  [89 %-100 %] 100 % (11/03 2000)  BP 128/71   Pulse (!) 121   Temp 99.6 F (37.6 C) (Rectal)   Resp (!) 23   SpO2 100%    GEN thin elderly caucasian female; sitting up in stretcher, uncomfortable, breathing labored  HEENT NCAT EOM intact PERRL; clear oropharynx, no cervical LAD; moist mucus membranes  JVP estimated 6 cm H2O above RA; no HJR ; no carotid bruits b/l ;  CV regular normal rate; loud S1 and S2; no m/r/g or S3/S4; PMI non displaced; + parasternal heave RESP  Diminished at bases, dense wheezing throughout; labored and +accessory mm use  ABD soft  NT ND +normoactive BS  EXT warm throughout b/l; no peripheral edema b/l  PULSES  DP and radials 2+ intact b/l  SKIN/MSK no rashes or lesions  NEURO/PSYCH AAOx4; no focal deficits   DATA   LABS ON ADMISSION:  Basic Metabolic Panel: Recent Labs  Lab 06/21/18 1542  NA 139  K 3.5  CL 99  CO2 29  GLUCOSE 137*  BUN 11  CREATININE 0.81  CALCIUM 8.9   CBC: Recent Labs  Lab 06/21/18 1542  WBC 14.8*  NEUTROABS 13.5*  HGB 10.7*  HCT 36.2  MCV 87.7  PLT 250   Liver Function Tests: Recent Labs  Lab 06/21/18 1542  AST 16  ALT 7  ALKPHOS 106  BILITOT 0.8  PROT 7.4  ALBUMIN 3.5   Recent Labs  Lab 06/21/18 1542  LIPASE 21   No results for input(s): AMMONIA in the last 168 hours. Coagulation:  Lab Results  Component Value Date   INR 0.98 08/01/2017   No results found for: PTT Lactic Acid, Venous:     Component Value Date/Time   LATICACIDVEN 1.96 (H) 06/21/2018 1748   Cardiac Enzymes: No results for input(s): CKTOTAL, CKMB, CKMBINDEX, TROPONINI in the last 168 hours. Urinalysis:    Component Value Date/Time   COLORURINE  YELLOW 06/07/2018 0159   APPEARANCEUR CLOUDY (A) 06/07/2018 0159   LABSPEC 1.016 06/07/2018 0159   PHURINE 5.0 06/07/2018 0159   GLUCOSEU NEGATIVE 06/07/2018 0159   HGBUR LARGE (A) 06/07/2018 0159   BILIRUBINUR NEGATIVE 06/07/2018 0159   KETONESUR NEGATIVE 06/07/2018 0159   PROTEINUR 30 (A) 06/07/2018 0159   NITRITE NEGATIVE 06/07/2018 0159   LEUKOCYTESUR MODERATE (A) 06/07/2018 0159    BNP (last 3 results) No results for input(s): PROBNP in the last 8760 hours. CBG: No results for input(s): GLUCAP in the last 168 hours.  Radiological Exams on Admission: Dg Chest 2 View  Result Date: 06/21/2018 CLINICAL DATA:  Shortness of breath. EXAM: CHEST - 2 VIEW COMPARISON:  Body CT 06/06/2018 FINDINGS: Cardiomediastinal silhouette is normal. Mediastinal contours appear intact. 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 There is no evidence of pleural effusion or pneumothorax. Marked bilateral emphysema and hyperinflation of the lungs. Linear right middle pulmonary lobe opacity. Osseous structures are without acute abnormality. Soft tissues are grossly normal. IMPRESSION: Marked emphysematous changes of the lungs. Linear right middle lobe opacity may represent atelectasis or airspace consolidation. 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 Electronically Signed   By: Fidela Salisbury M.D.   On: 06/21/2018 15:42    EKG: Independently reviewed. NSR no significant ST abnormalities   ASSESSMENT AND PLAN   Assessment: Staceyann Knouff is a 77 y.o. 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 Suggestive of COPD flare although cannot rule out pneumonia based on CXR that showed possible consolidation in RML. Leukocytosis WBC 14.8. Significant dyspnea and wheezing on exam. Will treat with frequent nebs, abx, and PO steroids. Mild lactate elevation 1.9; s/p fluids in ED. BP stable.  Will recheck lactate in AM.   Active Problems:   COPD exacerbation (HCC)  Plan:   # COPD exacerbation. Cannot rule out PNA given borderline CXR and leukocytosis  > significant wheezing on initial exam. On home 2lpm O2.  - azithromycin 500mg  IVx 1and PO 250mg  x 3 doses - ceftriaxone 1gm IV qd given severity of COPD(can dc pending clinical improvement) - duonebs q4h  -  sat goal > 88% - s/p solumedrol 125mg  x 1 in ED - prednisone 40mg  x 5 days starting tomorrow - repeat lactate in AM  # Hx of depression and anxiety  - resume home zoloft - resume home xanax 0.5 BID prn anxiety   # Restless legs syndrome  - resume home ropironole and prn tramadol-tylenol   DVT Prophylaxis: enoxaparin Code Status:  DNR Family Communication: patient; ED communicated with family  Disposition Plan: admit; discharge pending clinical improvement   Patient contact: Extended Emergency Contact Information Primary Emergency Contact: Tanda Rockers Mobile Phone: [phone] Relation: Son Secondary Emergency Contact: Johnson,Richard Home Phone: [phone] Mobile Phone: [phone] Relation: Son  Time spent: > 35 mins  Colbert Ewing, MD [facility] Pager [phone]  If 7PM-7AM, please contact night-coverage [email or web address] Password [email or web address] 06/21/2018, 8:20 PM

## 2018-06-22 LAB — BASIC METABOLIC PANEL
ANION GAP: 13 (ref 5–15)
BUN: 15 mg/dL (ref 8–23)
CALCIUM: 8.9 mg/dL (ref 8.9–10.3)
CO2: 26 mmol/L (ref 22–32)
CREATININE: 0.82 mg/dL (ref 0.44–1.00)
Chloride: 103 mmol/L (ref 98–111)
GFR calc Af Amer: >60 mL/min (ref >60)
Glucose, Bld: 136 mg/dL — ABNORMAL HIGH (ref 70–99)
Potassium: 2.8 mmol/L — ABNORMAL LOW (ref 3.5–5.1)
Sodium: 142 mmol/L (ref 135–145)

## 2018-06-22 LAB — CBC WITH DIFFERENTIAL/PLATELET
Abs Immature Granulocytes: 0.06 10*3/uL (ref 0.00–0.07)
BASOS PCT: 0 %
Basophils Absolute: 0 10*3/uL (ref 0.0–0.1)
EOS ABS: 0 10*3/uL (ref 0.0–0.5)
EOS PCT: 0 %
HEMATOCRIT: 33.6 % — AB (ref 36.0–46.0)
Hemoglobin: 10 g/dL — ABNORMAL LOW (ref 12.0–15.0)
Immature Granulocytes: 1 %
Lymphocytes Relative: 4 %
Lymphs Abs: 0.4 10*3/uL — ABNORMAL LOW (ref 0.7–4.0)
MCH: 26.2 pg (ref 26.0–34.0)
MCHC: 29.8 g/dL — ABNORMAL LOW (ref 30.0–36.0)
MCV: 88 fL (ref 80.0–100.0)
MONO ABS: 0.4 10*3/uL (ref 0.1–1.0)
MONOS PCT: 3 %
NEUTROS PCT: 92 %
Neutro Abs: 10.3 10*3/uL — ABNORMAL HIGH (ref 1.7–7.7)
Platelets: 241 10*3/uL (ref 150–400)
RBC: 3.82 MIL/uL — ABNORMAL LOW (ref 3.87–5.11)
RDW: 20.3 % — AB (ref 11.5–15.5)
WBC: 11.1 10*3/uL — ABNORMAL HIGH (ref 4.0–10.5)
nRBC: 0 % (ref 0.0–0.2)

## 2018-06-22 LAB — MAGNESIUM: Magnesium: 1.9 mg/dL (ref 1.7–2.4)

## 2018-06-22 MED ORDER — METHYLPREDNISOLONE SODIUM SUCC 125 MG IJ SOLR
60.0000 mg | Freq: Four times a day (QID) | INTRAMUSCULAR | Status: DC
  Administered 2018-06-22 – 2018-06-25 (×14): 60 mg via INTRAVENOUS
  Filled 2018-06-22 (×14): qty 2

## 2018-06-22 MED ORDER — ONDANSETRON HCL 4 MG/2ML IJ SOLN
4.0000 mg | Freq: Four times a day (QID) | INTRAMUSCULAR | Status: DC | PRN
  Administered 2018-06-22 – 2018-06-26 (×5): 4 mg via INTRAVENOUS
  Filled 2018-06-22 (×5): qty 2

## 2018-06-22 MED ORDER — GUAIFENESIN ER 600 MG PO TB12
1200.0000 mg | ORAL_TABLET | Freq: Two times a day (BID) | ORAL | Status: DC
  Administered 2018-06-22 – 2018-06-25 (×7): 1200 mg via ORAL
  Filled 2018-06-22 (×7): qty 2

## 2018-06-22 MED ORDER — POTASSIUM CHLORIDE 10 MEQ/100ML IV SOLN
10.0000 meq | INTRAVENOUS | Status: AC
  Administered 2018-06-22 (×4): 10 meq via INTRAVENOUS
  Filled 2018-06-22 (×4): qty 100

## 2018-06-22 MED ORDER — ENOXAPARIN SODIUM 30 MG/0.3ML ~~LOC~~ SOLN
30.0000 mg | SUBCUTANEOUS | Status: DC
  Administered 2018-06-23 – 2018-06-30 (×8): 30 mg via SUBCUTANEOUS
  Filled 2018-06-22 (×8): qty 0.3

## 2018-06-22 MED ORDER — ENSURE ENLIVE PO LIQD
237.0000 mL | ORAL | Status: DC
  Administered 2018-06-23 – 2018-06-25 (×3): 237 mL via ORAL

## 2018-06-22 NOTE — Care Management Note (Signed)
Case Management Note  Patient Details  Name: Erica York MRN: [id number] Date of Birth: [birth year]  Subjective/Objective: From home. COPD.Active w/[facility]-HHRN/PT/OT/aide-rep Dorian Pod already following-Spoke to patient in rm about d/c plans-going home w/HHC-has home 02-[facility] has travel tank. 3 adm/past 6 months.                    Action/Plan:dc home w/HHC.   Expected Discharge Date:  06/22/18               Expected Discharge Plan:  [place]  In-House Referral:  NA  Discharge planning Services  CM Consult  Post Acute Care Choice:  Home Health, Resumption of Svcs/PTA Provider Choice offered to:  Patient  DME Arranged:  N/A DME Agency:  NA  HH Arranged:  RN, Disease Management, PT, Nurse's Aide [place] Agency:  [facility]  Status of Service:  In process, will continue to follow  If discussed at Long Length of Stay Meetings, dates discussed:    Additional Comments:  Dessa Phi, RN 06/22/2018, 3:20 PM

## 2018-06-22 NOTE — Evaluation (Signed)
Occupational Therapy Evaluation Patient Details Name: Erica York MRN: [id number] DOB: September 22, 1940 Today's Date: 06/22/2018    History of Present Illness 77 y.o. 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 Suggestive of COPD exacerbation although cannot rule out pneumonia based on CXR    Clinical Impression   Pt admitted w/ dx: COPD exacerbation currently demonstrating deficits (See OT problem list below) in her ability to perform ADL and self care tasks. Pt lives alone and should benefit from acute OT to assist w/ pt/family education and ADL retraining while implementing EC techniques and increase independence for anticipated return home with PRN/intermittent family assist.    Follow Up Recommendations  No OT follow up;Supervision - Intermittent    Equipment Recommendations  None recommended by OT    Recommendations for Other Services       Precautions / Restrictions Precautions Precautions: Fall Restrictions Weight Bearing Restrictions: No      Mobility Bed Mobility Overal bed mobility: Modified Independent                Transfers Overall transfer level: Needs assistance Equipment used: Rolling walker (2 wheeled) Transfers: Sit to/from [facility] Sit to Stand: Min guard Stand pivot transfers: Min guard       General transfer comment: min guard for safety and steadying     Balance Overall balance assessment: Needs assistance Sitting-balance support: Single extremity supported;Feet supported Sitting balance-Leahy Scale: Good     Standing balance support: No upper extremity supported Standing balance-Leahy Scale: Fair                             ADL either performed or assessed with clinical judgement   ADL Overall ADL's : Needs assistance/impaired     Grooming: Set up;Sitting   Upper Body Bathing: Set up;Sitting   Lower Body  Bathing: Min guard;Sit to/from stand   Upper Body Dressing : Set up;Sitting   Lower Body Dressing: Min guard;Sit to/from stand   Toilet Transfer: Supervision/safety;Min guard;RW;BSC;Stand-pivot   Toileting- Water quality scientist and Hygiene: Sit to/from stand;Sitting/lateral lean;Supervision/safety;Min guard       Functional mobility during ADLs: Min guard;Rolling walker(Uses/has rollator) General ADL Comments: Pt seen for OT assessment. Pt/family were educated in role of OT and plan of care. Pt lives alone and reports I however family is inquiring about life alert type system that would make them aware if pt were to fall at home. Spoke with care management about this and she will f/u with family about options of either life alert or "Nest" type system that famiy may be able to monitor if they decline life alert. Pt should benefit from acute OT for pt/family education JG:OTLXBW conservation techniques, ADL retraining secondary to deconditioned status with COPD exacerbation and pt lives alone.     Vision Patient Visual Report: No change from baseline Vision Assessment?: No apparent visual deficits     Perception     Praxis      Pertinent Vitals/Pain Pain Assessment: No/denies pain     Hand Dominance Right   Extremity/Trunk Assessment Upper Extremity Assessment Upper Extremity Assessment: Generalized weakness;Overall [facility] for tasks assessed   Lower Extremity Assessment Lower Extremity Assessment: Defer to PT evaluation   Cervical / Trunk Assessment Cervical / Trunk Assessment: Kyphotic   Communication Communication Communication: No difficulties   Cognition Arousal/Alertness: Awake/alert Behavior During Therapy: WFL for tasks  assessed/performed Overall Cognitive Status: Within Functional Limits for tasks assessed                                     General Comments       Exercises     Shoulder Instructions      Home Living Family/patient expects to  be discharged to:: Private residence Living Arrangements: Alone Available Help at Discharge: Family Type of Home: House Home Access: Stairs to enter Technical brewer of Steps: 3 Entrance Stairs-Rails: Can reach both Home Layout: One level     Bathroom Shower/Tub: Teacher, early years/pre: Standard     Home Equipment: Environmental consultant - 4 wheels;Grab bars - tub/shower   Additional Comments: children live 10-15 min away      Prior Functioning/Environment Level of Independence: Independent with assistive device(s)        Comments: uses rollator PRN        OT Problem List: Decreased knowledge of use of DME or AE;Cardiopulmonary status limiting activity;Decreased strength;Decreased activity tolerance      OT Treatment/Interventions: Self-care/ADL training;DME and/or AE instruction;Therapeutic activities;Energy conservation;Patient/family education    OT Goals(Current goals can be found in the care plan section) Acute Rehab OT Goals Patient Stated Goal: Go home OT Goal Formulation: With patient Time For Goal Achievement: 07/06/18 Potential to Achieve Goals: Good  OT Frequency: Min 2X/week   Barriers to D/C:    Lives alone       Co-evaluation              AM-PAC PT "6 Clicks" Daily Activity     Outcome Measure Help from another person eating meals?: None Help from another person taking care of personal grooming?: A Little Help from another person toileting, which includes using toliet, bedpan, or urinal?: A Little Help from another person bathing (including washing, rinsing, drying)?: A Little Help from another person to put on and taking off regular upper body clothing?: None Help from another person to put on and taking off regular lower body clothing?: A Little 6 Click Score: 20   End of Session Equipment Utilized During Treatment: Gait belt;Rolling walker Nurse Communication: Mobility status;Other (comment)(Pt/family requesting consult with care  management. OT spoke w/ Juliann Pulse in care management )  Activity Tolerance: Patient tolerated treatment well Patient left: in bed;with call bell/phone within reach;with bed alarm set;with family/visitor present  OT Visit Diagnosis: Muscle weakness (generalized) (M62.81)                Time: 2683-4196 OT Time Calculation (min): 23 min Charges:  OT General Charges $OT Visit: 1 Visit OT Evaluation $OT Eval Low Complexity: 1 Low   ,  Beth Dixon, OTR/L 06/22/2018, 11:14 AM

## 2018-06-22 NOTE — Evaluation (Signed)
Physical Therapy Evaluation Patient Details Name: Erica York MRN: [id number] DOB: 05/23/1941 Today's Date: 06/22/2018   History of Present Illness  77 y.o. 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 Suggestive of COPD exacerbation although cannot rule out pneumonia based on CXR   Clinical Impression  Pt admitted with above diagnosis. Pt currently with functional limitations due to the deficits listed below (see PT Problem List). Pt ambulated 140' without an assistive device, no loss of balance, SaO2 93% on 3L O2  and HR 109 with walking Pt will benefit from skilled PT to increase their independence and safety with mobility to allow discharge to the venue listed below.       Follow Up Recommendations Home health PT    Equipment Recommendations  None recommended by PT    Recommendations for Other Services       Precautions / Restrictions Precautions Precautions: Fall;Other (comment) Precaution Comments: monitor O2; pt reports 1 fall about 4 weeks ago related to medication change, denies other falls in past 1 year, uses 2L O2 at home Restrictions Weight Bearing Restrictions: No      Mobility  Bed Mobility Overal bed mobility: Modified Independent             General bed mobility comments: HOB up 25*, used rail  Transfers Overall transfer level: Needs assistance Equipment used: Rolling walker (2 wheeled) Transfers: Sit to/from Stand Sit to Stand: Supervision Stand pivot transfers: Min guard       General transfer comment: steady, no loss of balance  Ambulation/Gait Ambulation/Gait assistance: Supervision Gait Distance (Feet): 140 Feet Assistive device: None Gait Pattern/deviations: WFL(Within Functional Limits)     General Gait Details: steady, no loss of balance, SaO2 93% on 3L O2, HR 109  Stairs            Wheelchair Mobility    Modified Rankin (Stroke  Patients Only)       Balance Overall balance assessment: Needs assistance Sitting-balance support: Single extremity supported;Feet supported Sitting balance-Leahy Scale: Good     Standing balance support: No upper extremity supported Standing balance-Leahy Scale: Fair                               Pertinent Vitals/Pain Pain Assessment: No/denies pain    Home Living Family/patient expects to be discharged to:: Private residence Living Arrangements: Alone Available Help at Discharge: Family Type of Home: House Home Access: Stairs to enter Entrance Stairs-Rails: Can reach both Entrance Stairs-Number of Steps: 3 Home Layout: One level Home Equipment: Walker - 4 wheels;Grab bars - tub/shower Additional Comments: children live 10-15 min away    Prior Function Level of Independence: Independent with assistive device(s)         Comments: uses rollator PRN     Hand Dominance   Dominant Hand: Right    Extremity/Trunk Assessment   Upper Extremity Assessment Upper Extremity Assessment: Defer to OT evaluation    Lower Extremity Assessment Lower Extremity Assessment: Overall WFL for tasks assessed(4/5 knee ext, sensation intact to light touch B feet)    Cervical / Trunk Assessment Cervical / Trunk Assessment: Kyphotic  Communication   Communication: No difficulties  Cognition Arousal/Alertness: Awake/alert Behavior During Therapy: WFL for tasks assessed/performed Overall Cognitive Status: Within Functional Limits for tasks assessed  General Comments      Exercises     Assessment/Plan    PT Assessment Patient needs continued PT services  PT Problem List Decreased knowledge of use of DME;Decreased activity tolerance;Decreased safety awareness;Decreased mobility;Decreased coordination       PT Treatment Interventions DME instruction;Balance training;Gait training;Neuromuscular  re-education;Stair training;Functional mobility training;Patient/family education;Therapeutic activities;Therapeutic exercise;Manual techniques    PT Goals (Current goals can be found in the Care Plan section)  Acute Rehab PT Goals Patient Stated Goal: Go home PT Goal Formulation: With patient Time For Goal Achievement: 07/06/18 Potential to Achieve Goals: Good    Frequency Min 3X/week   Barriers to discharge        Co-evaluation               AM-PAC PT "6 Clicks" Daily Activity  Outcome Measure Difficulty turning over in bed (including adjusting bedclothes, sheets and blankets)?: None Difficulty moving from lying on back to sitting on the side of the bed? : None Difficulty sitting down on and standing up from a chair with arms (e.g., wheelchair, bedside commode, etc,.)?: A Little Help needed moving to and from a bed to chair (including a wheelchair)?: A Little Help needed walking in hospital room?: A Little Help needed climbing 3-5 steps with a railing? : A Little 6 Click Score: 20    End of Session Equipment Utilized During Treatment: Gait belt;Oxygen Activity Tolerance: Patient tolerated treatment well Patient left: in bed;with bed alarm set;with call bell/phone within reach;with nursing/sitter in room Nurse Communication: Mobility status PT Visit Diagnosis: Difficulty in walking, not elsewhere classified (R26.2)    Time: 6759-1638 PT Time Calculation (min) (ACUTE ONLY): 29 min   Charges:   PT Evaluation $PT Eval Low Complexity: 1 Low PT Treatments $Gait Training: 8-22 mins        Blondell Reveal Kistler PT 06/22/2018  Acute Rehabilitation Services Pager [phone] Office [phone]

## 2018-06-22 NOTE — Progress Notes (Signed)
[facility]  PROGRESS NOTE  Erica York [id number]:[id number] DOB: [birth year] DOA: 06/21/2018 PCP: Heywood Bene, PA-C   Brief HPI:   77 year old female with a history of COPD on home oxygen, depression, hypertension, RLS, came to hospital with progressive dyspnea and productive cough for past 3 to 4 days.  Patient admitted with COPD exacerbation.    Subjective   Patient seen and examined, still having wheezing.  Requiring 4 L/min of oxygen via nasal cannula   Assessment/Plan:     1. COPD exacerbation-patient still has significant wheezing on examination, will change Solu-Medrol to 60 mg IV every 6 hours, continue ceftriaxone, Zithromax.  Mucinex 1 tablet p.o. twice daily.  2. Hypokalemia-potassium 2.8, will replace potassium and check BMP in a.m.  3. History of depression/anxiety-continue Zoloft, PRN Xanax  4. Restless leg syndrome-continue Requip, PRN tramadol.      CBC: Recent Labs  Lab 06/21/18 1542 06/22/18 0522  WBC 14.8* 11.1*  NEUTROABS 13.5* 10.3*  HGB 10.7* 10.0*  HCT 36.2 33.6*  MCV 87.7 88.0  PLT 250 371    Basic Metabolic Panel: Recent Labs  Lab 06/21/18 1542 06/22/18 0522  NA 139 142  K 3.5 2.8*  CL 99 103  CO2 29 26  GLUCOSE 137* 136*  BUN 11 15  CREATININE 0.81 0.82  CALCIUM 8.9 8.9  MG  --  1.9     DVT prophylaxis: Lovenox  Code Status: DNR  Family Communication: No family at bedside  Disposition Plan: likely home when medically ready for discharge   Consultants:    Procedures:     Antibiotics:   Anti-infectives (From admission, onward)   Start     Dose/Rate Route Frequency Ordered Stop   06/22/18 1800  azithromycin (ZITHROMAX) tablet 250 mg     250 mg Oral Daily-1800 06/21/18 1917 06/25/18 1759   06/21/18 2200  cefTRIAXone (ROCEPHIN) 1 g in sodium chloride 0.9 % 100 mL IVPB     1 g 200 mL/hr over 30 Minutes Intravenous Daily at bedtime 06/21/18 1917 06/23/18 2159   06/21/18 1930  azithromycin  (ZITHROMAX) 500 mg in sodium chloride 0.9 % 250 mL IVPB     500 mg 250 mL/hr over 60 Minutes Intravenous  Once 06/21/18 1917 06/22/18 0025   06/21/18 1630  doxycycline (VIBRA-TABS) tablet 100 mg     100 mg Oral  Once 06/21/18 1622 06/21/18 1659       Objective   Vitals:   06/21/18 2031 06/21/18 2323 06/22/18 0530 06/22/18 0741  BP: 120/65  115/60   Pulse: (!) 113  98   Resp: (!) 24  16   Temp: 98.5 F (36.9 C)  97.6 F (36.4 C)   TempSrc: Oral  Oral   SpO2: 93% 93% 96% 94%  Weight: 41.7 kg     Height: 4\' 11"  (1.499 m)       Intake/Output Summary (Last 24 hours) at 06/22/2018 1332 Last data filed at 06/22/2018 0600 Gross per 24 hour  Intake 430.09 ml  Output -  Net 430.09 ml   Filed Weights   06/21/18 2031  Weight: 41.7 kg     Physical Examination:    General: Appears in no acute distress  Cardiovascular: S1-S2, regular, no murmurs auscultated  Respiratory: Bilateral wheezing auscultated  Abdomen: Soft, nontender, no organomegaly  Extremities: No edema of the lower extremities  Neurologic: Alert, oriented x3, no focal deficit noted     Data Reviewed: I have personally reviewed following labs and imaging studies  No results found for this or any previous visit (from the past 240 hour(s)).   Liver Function Tests: Recent Labs  Lab 06/21/18 1542  AST 16  ALT 7  ALKPHOS 106  BILITOT 0.8  PROT 7.4  ALBUMIN 3.5   Recent Labs  Lab 06/21/18 1542  LIPASE 21   No results for input(s): AMMONIA in the last 168 hours.  Cardiac Enzymes: No results for input(s): CKTOTAL, CKMB, CKMBINDEX, TROPONINI in the last 168 hours. BNP (last 3 results) Recent Labs    06/21/18 1542  BNP 215.8*    ProBNP (last 3 results) No results for input(s): PROBNP in the last 8760 hours.    Studies: Dg Chest 2 View  Result Date: 06/21/2018 CLINICAL DATA:  Shortness of breath. EXAM: CHEST - 2 VIEW COMPARISON:  Body CT 06/06/2018 FINDINGS: Cardiomediastinal  silhouette is normal. Mediastinal contours appear intact. 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 There is no evidence of pleural effusion or pneumothorax. Marked bilateral emphysema and hyperinflation of the lungs. Linear right middle pulmonary lobe opacity. Osseous structures are without acute abnormality. Soft tissues are grossly normal. IMPRESSION: Marked emphysematous changes of the lungs. Linear right middle lobe opacity may represent atelectasis or airspace consolidation. 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 Electronically Signed   By: Fidela Salisbury M.D.   On: 06/21/2018 15:42    Scheduled Meds: . azithromycin  250 mg Oral q1800  . enoxaparin (LOVENOX) injection  40 mg Subcutaneous Q24H  . guaiFENesin  1,200 mg Oral BID  . ipratropium-albuterol  3 mL Nebulization Q6H  . methylPREDNISolone (SOLU-MEDROL) injection  60 mg Intravenous Q6H  . rOPINIRole  2 mg Oral QHS  . sertraline  100 mg Oral QHS    Admission status: Inpatient: Based on patients clinical presentation and evaluation of above clinical data, I have made determination that patient meets Inpatient criteria at this time.  Patient is on IV Solu-Medrol, not back to baseline of 2 L per oxygen at nasal cannula.  Patient requiring higher oxygen 4 L/min in the hospital.  Time spent: 25 min  [place] [facility] [place] [facility] Pager [phone]. If 7PM-7AM, please contact night-coverage at [email or web address], Office  [phone]  password [email or web address]  06/22/2018, 1:32 PM  LOS: 1 day

## 2018-06-22 NOTE — Progress Notes (Signed)
Initial Nutrition Assessment  DOCUMENTATION CODES:   Non-severe (moderate) malnutrition in context of chronic illness  INTERVENTION:  - Will order Ensure Enlive once/day, this supplement provides 350 kcal and 20 grams of protein. - Continue to encourage PO intakes.    NUTRITION DIAGNOSIS:   Moderate Malnutrition related to chronic illness(COPD) as evidenced by moderate muscle depletion, moderate fat depletion.  GOAL:   Patient will meet greater than or equal to 90% of their needs  MONITOR:   PO intake, Supplement acceptance, Weight trends, Labs  REASON FOR ASSESSMENT:   Consult Assessment of nutrition requirement/status  ASSESSMENT:   77 y.o. female with hx of COPD on home O2, depression, HTN, RCC, and RLS. She presented with progressive dyspnea and productive cough x3-4 days. She reports that she could barely breathe with the assistance of her home albuterol nebulizer the night PTA.  BMI indicates normal weight/borderline underweight. No intakes documented since admission. Patient was eating lunch during RD visit. She reports that she has a good appetite at baseline and typically eats lunch and dinner meals, which she cooks for herself. She does not eat breakfast unless she goes out to eat with someone.   Patient denies any chewing or swallowing difficulties. She denies any changes in appetite or ability to eat with increased WOB over the past few days. She denies abdominal pain or nausea now or PTA.   Per chart review, current weight is 92 lb and weight had trended up from 7/31 until 10/21, when patient weighed 105 lb. Will monitor weight trends as unsure if weight on 10/21 was accurate.    Medications reviewed; 125 mg Solu-medrol x1 dose yesterday, 60 mg Solu-medrol QID. Labs reviewed; K: 2.8 mmol/L.       NUTRITION - FOCUSED PHYSICAL EXAM:    Most Recent Value  Orbital Region  Mild depletion  Upper Arm Region  Moderate depletion  Thoracic and Lumbar Region   Unable to assess  Buccal Region  Mild depletion  Temple Region  Mild depletion  Clavicle Bone Region  Moderate depletion  Clavicle and Acromion Bone Region  Moderate depletion  Scapular Bone Region  Unable to assess  Dorsal Hand  Moderate depletion  Patellar Region  Unable to assess  Anterior Thigh Region  Unable to assess  Posterior Calf Region  Unable to assess  Edema (RD Assessment)  Unable to assess  Hair  Reviewed  Eyes  Reviewed  Mouth  Reviewed  Skin  Reviewed  Nails  Reviewed       Diet Order:   Diet Order            Diet regular Room service appropriate? Yes; Fluid consistency: Thin  Diet effective now              EDUCATION NEEDS:   No education needs have been identified at this time  Skin:  Skin Assessment: Reviewed RN Assessment  Last BM:  11/3  Height:   Ht Readings from Last 1 Encounters:  06/21/18 4\' 11"  (1.499 m)    Weight:   Wt Readings from Last 1 Encounters:  06/21/18 41.7 kg    Ideal Body Weight:  42.91 kg  BMI:  Body mass index is 18.57 kg/m.  Estimated Nutritional Needs:   Kcal:  1375-1540 (33-37 kcal/kg)  Protein:  62-75 grams (1.5-1.8 grams/kg)  Fluid:  >/= 1.5 L/day     Jarome Matin, MS, RD, LDN, [facility] Inpatient Clinical Dietitian Pager # [phone] After hours/weekend pager # [phone]

## 2018-06-23 LAB — CBC WITH DIFFERENTIAL/PLATELET
ABS IMMATURE GRANULOCYTES: 0.16 10*3/uL — AB (ref 0.00–0.07)
BASOS PCT: 0 %
Basophils Absolute: 0 10*3/uL (ref 0.0–0.1)
EOS ABS: 0 10*3/uL (ref 0.0–0.5)
Eosinophils Relative: 0 %
HCT: 34.7 % — ABNORMAL LOW (ref 36.0–46.0)
Hemoglobin: 10.1 g/dL — ABNORMAL LOW (ref 12.0–15.0)
IMMATURE GRANULOCYTES: 1 %
Lymphocytes Relative: 4 %
Lymphs Abs: 0.5 10*3/uL — ABNORMAL LOW (ref 0.7–4.0)
MCH: 26.3 pg (ref 26.0–34.0)
MCHC: 29.1 g/dL — ABNORMAL LOW (ref 30.0–36.0)
MCV: 90.4 fL (ref 80.0–100.0)
MONO ABS: 0.3 10*3/uL (ref 0.1–1.0)
Monocytes Relative: 2 %
NEUTROS ABS: 12.4 10*3/uL — AB (ref 1.7–7.7)
NEUTROS PCT: 93 %
PLATELETS: 257 10*3/uL (ref 150–400)
RBC: 3.84 MIL/uL — AB (ref 3.87–5.11)
RDW: 20.3 % — AB (ref 11.5–15.5)
WBC: 13.3 10*3/uL — AB (ref 4.0–10.5)
nRBC: 0 % (ref 0.0–0.2)

## 2018-06-23 LAB — BASIC METABOLIC PANEL
ANION GAP: 9 (ref 5–15)
BUN: 24 mg/dL — ABNORMAL HIGH (ref 8–23)
CALCIUM: 9.2 mg/dL (ref 8.9–10.3)
CO2: 26 mmol/L (ref 22–32)
Chloride: 107 mmol/L (ref 98–111)
Creatinine, Ser: 0.77 mg/dL (ref 0.44–1.00)
GFR calc Af Amer: >60 mL/min (ref >60)
Glucose, Bld: 135 mg/dL — ABNORMAL HIGH (ref 70–99)
POTASSIUM: 4 mmol/L (ref 3.5–5.1)
SODIUM: 142 mmol/L (ref 135–145)

## 2018-06-23 LAB — MAGNESIUM: MAGNESIUM: 2.1 mg/dL (ref 1.7–2.4)

## 2018-06-23 NOTE — Progress Notes (Signed)
PT continues to demonstrate hands on understanding of Flutter device. Productive cough at this time.

## 2018-06-23 NOTE — Progress Notes (Signed)
[facility]  PROGRESS NOTE  Erica York [id number]:[id number] DOB: March 03, 1941 DOA: 06/21/2018 PCP: Heywood Bene, PA-C   Brief HPI:   77 year old female with a history of COPD on home oxygen, depression, hypertension, RLS, came to hospital with progressive dyspnea and productive cough for past 3 to 4 days.  Patient admitted with COPD exacerbation.    Subjective   Patient seen and examined, breathing little better. Not at baseline, still requiring 4L/m of oxygen via [place]. Still coughing thick phlegm   Assessment/Plan:     1. COPD exacerbation-patient still has significant wheezing on examination, continue Solu-Medrol  60 mg IV every 6 hours, patient was started on continue ceftriaxone, Zithromax.  Mucinex 1 tablet p.o. twice daily.Add Flutter valve.  2. ? Community acquired pneumonia- CXR shows right middle lobe opacity atelectasis vs infiltrate. Continue ceftriaxone and zithromax as above.  3. Hypokalemia-replete, potassium 4.0  4. History of depression/anxiety-continue Zoloft, PRN Xanax  5. Restless leg syndrome-continue Requip, PRN tramadol.      CBC: Recent Labs  Lab 06/21/18 1542 06/22/18 0522 06/23/18 0547  WBC 14.8* 11.1* 13.3*  NEUTROABS 13.5* 10.3* 12.4*  HGB 10.7* 10.0* 10.1*  HCT 36.2 33.6* 34.7*  MCV 87.7 88.0 90.4  PLT 250 241 638    Basic Metabolic Panel: Recent Labs  Lab 06/21/18 1542 06/22/18 0522 06/23/18 0547  NA 139 142 142  K 3.5 2.8* 4.0  CL 99 103 107  CO2 29 26 26   GLUCOSE 137* 136* 135*  BUN 11 15 24*  CREATININE 0.81 0.82 0.77  CALCIUM 8.9 8.9 9.2  MG  --  1.9 2.1     DVT prophylaxis: Lovenox  Code Status: DNR  Family Communication: No family at bedside  Disposition Plan: likely home when medically ready for discharge   Consultants:    Procedures:     Antibiotics:   Anti-infectives (From admission, onward)   Start     Dose/Rate Route Frequency Ordered Stop   06/22/18 1800  azithromycin (ZITHROMAX)  tablet 250 mg     250 mg Oral Daily-1800 06/21/18 1917 06/25/18 1759   06/21/18 2200  cefTRIAXone (ROCEPHIN) 1 g in sodium chloride 0.9 % 100 mL IVPB     1 g 200 mL/hr over 30 Minutes Intravenous Daily at bedtime 06/21/18 1917 06/23/18 0757   06/21/18 1930  azithromycin (ZITHROMAX) 500 mg in sodium chloride 0.9 % 250 mL IVPB     500 mg 250 mL/hr over 60 Minutes Intravenous  Once 06/21/18 1917 06/22/18 0025   06/21/18 1630  doxycycline (VIBRA-TABS) tablet 100 mg     100 mg Oral  Once 06/21/18 1622 06/21/18 1659       Objective   Vitals:   06/22/18 2132 06/23/18 0100 06/23/18 0631 06/23/18 0841  BP: (!) 123/57  131/70   Pulse: 86  68   Resp: 16  18   Temp: 98.3 F (36.8 C)  98.4 F (36.9 C)   TempSrc: Oral  Oral   SpO2: 95% (!) 89% 90% 94%  Weight:      Height:        Intake/Output Summary (Last 24 hours) at 06/23/2018 0854 Last data filed at 06/22/2018 2300 Gross per 24 hour  Intake 1042 ml  Output -  Net 1042 ml   Filed Weights   06/21/18 2031  Weight: 41.7 kg     Physical Examination:     Mouth: Oral mucosa is moist, no lesions on palate,  Neck: Supple, no deformities, masses, or tenderness Lungs:  Normal respiratory effort, b/l wheezing auscultated.  Heart: Regular rate and rhythm, S1 and S2 normal, no murmurs, rubs auscultated Abdomen: BS normoactive,soft,nondistended,non-tender to palpation,no organomegaly Extremities: No pretibial edema, no erythema, no cyanosis, no clubbing Neuro : Alert and oriented to time, place and person, No focal deficits  Skin: No rashes seen on exam     Data Reviewed: I have personally reviewed following labs and imaging studies   Recent Results (from the past 240 hour(s))  Culture, blood (routine x 2)     Status: None (Preliminary result)   Collection Time: 06/21/18  5:47 PM  Result Value Ref Range Status   Specimen Description   Final    BLOOD RIGHT ANTECUBITAL Performed at [facility]  [address], [place], [place] [address]    Special Requests   Final    BOTTLES DRAWN AEROBIC AND ANAEROBIC Blood Culture adequate volume Performed at [facility] [address], [place], [place] [address]    Culture   Final    NO GROWTH < 24 HOURS Performed at [facility] [address] [place], [place] [address]    Report Status PENDING  Incomplete  Culture, blood (routine x 2)     Status: None (Preliminary result)   Collection Time: 06/21/18  5:51 PM  Result Value Ref Range Status   Specimen Description   Final    BLOOD RIGHT WRIST Performed at [facility] [address] [place], [place] [address]    Special Requests   Final    BOTTLES DRAWN AEROBIC AND ANAEROBIC Blood Culture adequate volume Performed at [facility] [address], [place], [place] [address]    Culture   Final    NO GROWTH < 24 HOURS Performed at [facility] [address], [place], [place] [address]    Report Status PENDING  Incomplete     Liver Function Tests: Recent Labs  Lab 06/21/18 1542  AST 16  ALT 7  ALKPHOS 106  BILITOT 0.8  PROT 7.4  ALBUMIN 3.5   Recent Labs  Lab 06/21/18 1542  LIPASE 21   No results for input(s): AMMONIA in the last 168 hours.  Cardiac Enzymes: No results for input(s): CKTOTAL, CKMB, CKMBINDEX, TROPONINI in the last 168 hours. BNP (last 3 results) Recent Labs    06/21/18 1542  BNP 215.8*    ProBNP (last 3 results) No results for input(s): PROBNP in the last 8760 hours.    Studies: Dg Chest 2 View  Result Date: 06/21/2018 CLINICAL DATA:  Shortness of breath. EXAM: CHEST - 2 VIEW COMPARISON:  Body CT 06/06/2018 FINDINGS: Cardiomediastinal silhouette is normal. Mediastinal contours appear intact. 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 There is no evidence of pleural effusion or pneumothorax. Marked bilateral emphysema and hyperinflation of the lungs. Linear  right middle pulmonary lobe opacity. Osseous structures are without acute abnormality. Soft tissues are grossly normal. IMPRESSION: Marked emphysematous changes of the lungs. Linear right middle lobe opacity may represent atelectasis or airspace consolidation. 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 Electronically Signed   By: Fidela Salisbury M.D.   On: 06/21/2018 15:42    Scheduled Meds: . azithromycin  250 mg Oral q1800  . enoxaparin (LOVENOX) injection  30 mg Subcutaneous Q24H  . feeding supplement (ENSURE ENLIVE)  237 mL Oral Q24H  . guaiFENesin  1,200 mg Oral BID  . ipratropium-albuterol  3 mL Nebulization Q6H  . methylPREDNISolone (  SOLU-MEDROL) injection  60 mg Intravenous Q6H  . rOPINIRole  2 mg Oral QHS  . sertraline  100 mg Oral QHS    Admission status: Inpatient: Based on patients clinical presentation and evaluation of above clinical data, I have made determination that patient meets Inpatient criteria at this time.  Patient is on IV Solu-Medrol, not back to baseline of 2 L per oxygen at nasal cannula.  Patient requiring higher oxygen 4 L/min in the hospital.  Time spent: 25 min  [facility] Pager [phone]. If 7PM-7AM, please contact night-coverage at [email or web address], Office  [phone]  password [place]  06/23/2018, 8:54 AM  LOS: 2 days

## 2018-06-23 NOTE — Progress Notes (Signed)
PT demonstrated hands on understanding of Flutter device- productive cough at this time.  

## 2018-06-24 DIAGNOSIS — K56609 Unspecified intestinal obstruction, unspecified as to partial versus complete obstruction: Secondary | ICD-10-CM

## 2018-06-24 DIAGNOSIS — E440 Moderate protein-calorie malnutrition: Secondary | ICD-10-CM

## 2018-06-24 DIAGNOSIS — R1030 Lower abdominal pain, unspecified: Secondary | ICD-10-CM

## 2018-06-24 DIAGNOSIS — J441 Chronic obstructive pulmonary disease with (acute) exacerbation: Principal | ICD-10-CM

## 2018-06-24 DIAGNOSIS — J9621 Acute and chronic respiratory failure with hypoxia: Secondary | ICD-10-CM

## 2018-06-24 DIAGNOSIS — F325 Major depressive disorder, single episode, in full remission: Secondary | ICD-10-CM

## 2018-06-24 DIAGNOSIS — E876 Hypokalemia: Secondary | ICD-10-CM

## 2018-06-24 DIAGNOSIS — J181 Lobar pneumonia, unspecified organism: Secondary | ICD-10-CM

## 2018-06-24 DIAGNOSIS — Z4659 Encounter for fitting and adjustment of other gastrointestinal appliance and device: Secondary | ICD-10-CM

## 2018-06-24 LAB — CBC WITH DIFFERENTIAL/PLATELET
Abs Immature Granulocytes: 0.13 10*3/uL — ABNORMAL HIGH (ref 0.00–0.07)
BASOS ABS: 0 10*3/uL (ref 0.0–0.1)
BASOS PCT: 0 %
EOS ABS: 0.1 10*3/uL (ref 0.0–0.5)
EOS PCT: 1 %
HCT: 36.1 % (ref 36.0–46.0)
HEMOGLOBIN: 10.3 g/dL — AB (ref 12.0–15.0)
Immature Granulocytes: 1 %
Lymphocytes Relative: 5 %
Lymphs Abs: 0.5 10*3/uL — ABNORMAL LOW (ref 0.7–4.0)
MCH: 25.9 pg — AB (ref 26.0–34.0)
MCHC: 28.5 g/dL — AB (ref 30.0–36.0)
MCV: 90.9 fL (ref 80.0–100.0)
MONO ABS: 0.3 10*3/uL (ref 0.1–1.0)
Monocytes Relative: 3 %
NRBC: 0 % (ref 0.0–0.2)
Neutro Abs: 10 10*3/uL — ABNORMAL HIGH (ref 1.7–7.7)
Neutrophils Relative %: 90 %
PLATELETS: 237 10*3/uL (ref 150–400)
RBC: 3.97 MIL/uL (ref 3.87–5.11)
RDW: 19.9 % — AB (ref 11.5–15.5)
WBC: 11 10*3/uL — AB (ref 4.0–10.5)

## 2018-06-24 LAB — BASIC METABOLIC PANEL
Anion gap: 8 (ref 5–15)
BUN: 31 mg/dL — AB (ref 8–23)
CALCIUM: 9.1 mg/dL (ref 8.9–10.3)
CHLORIDE: 106 mmol/L (ref 98–111)
CO2: 28 mmol/L (ref 22–32)
CREATININE: 0.93 mg/dL (ref 0.44–1.00)
GFR calc Af Amer: >60 mL/min (ref >60)
GFR calc non Af Amer: 59 mL/min — ABNORMAL LOW (ref >60)
Glucose, Bld: 125 mg/dL — ABNORMAL HIGH (ref 70–99)
Potassium: 4.2 mmol/L (ref 3.5–5.1)
SODIUM: 142 mmol/L (ref 135–145)

## 2018-06-24 LAB — MAGNESIUM: MAGNESIUM: 2.1 mg/dL (ref 1.7–2.4)

## 2018-06-24 MED ORDER — IPRATROPIUM-ALBUTEROL 0.5-2.5 (3) MG/3ML IN SOLN
3.0000 mL | Freq: Three times a day (TID) | RESPIRATORY_TRACT | Status: DC
  Administered 2018-06-24 – 2018-06-27 (×10): 3 mL via RESPIRATORY_TRACT
  Filled 2018-06-24 (×10): qty 3

## 2018-06-24 MED ORDER — AMOXICILLIN-POT CLAVULANATE 875-125 MG PO TABS: 1.0000 | ORAL_TABLET | Freq: Two times a day (BID) | ORAL | Status: DC

## 2018-06-24 NOTE — Progress Notes (Signed)
PT continues to utilize Flutter device. Productive cough at this time.

## 2018-06-24 NOTE — Progress Notes (Signed)
[facility] PROGRESS NOTE    Progress Note  Erica York  WUX:[id number] DOB: [birth year] DOA: 06/21/2018 PCP: Heywood Bene, PA-C     Brief Narrative:   Erica York is an 77 y.o. female past medical history of COPD oxygen dependent, essential hypertension comes in with progressive dyspnea and productive cough.  Assessment/Plan:   COPD exacerbation ([facility]): Started empirically on IV steroids, antibiotics and inhalers on admission. She uses 2 L of oxygen at home she seems to be at baseline as per patient. We will change her to oral steroids antibiotics continue inhalers.  Possible lobar pneumonia: Cont oral azithromycin.  Hypokalemia: Repleted orally, will continue to monitor intermittently. Magnesium was 2.1.  Major depression in complete remission (HCC) Cont meds.  DVT prophylaxis: lovenxo Family Communication:none Disposition Plan/Barrier to D/C: home in am Code Status:     Code Status Orders  (From admission, onward)         Start     Ordered   06/21/18 1911  Do not attempt resuscitation (DNR)  Continuous    Question Answer Comment  In the event of cardiac or respiratory ARREST Do not call a "code blue"   In the event of cardiac or respiratory ARREST Do not perform Intubation, CPR, defibrillation or ACLS   In the event of cardiac or respiratory ARREST Use medication by any route, position, wound care, and other measures to relive pain and suffering. May use oxygen, suction and manual treatment of airway obstruction as needed for comfort.      06/21/18 1917        Code Status History    Date Active Date Inactive Code Status Order ID Comments User Context   06/08/2018 1326 06/08/2018 2159 DNR [id number]  Jimmy Footman, NP Inpatient   06/07/2018 0018 06/08/2018 1326 Full Code [id number]  Jani Gravel, MD ED   04/23/2018 0017 04/25/2018 2028 Full Code [id number]  Etta Quill, DO ED   10/07/2017 0336 10/12/2017 1538 Full Code [id number]   Bethena Roys, MD Inpatient   08/01/2017 1349 08/08/2017 1417 Full Code [id number]  Erick Colace, NP ED    Advance Directive Documentation     Most Recent Value  Type of Advance Directive  Healthcare Power of Attorney, Living will  Pre-existing out of facility DNR order (yellow form or pink MOST form)  -  "MOST" Form in Place?  -        IV Access:    Peripheral IV   Procedures and diagnostic studies:   No results found.   Medical Consultants:    None.  Anti-Infectives:   Azithro  Subjective:    Erica York she has no new complaints she relates her breathing is better than compared to yesterday.  Objective:    Vitals:   06/23/18 2045 06/23/18 2131 06/24/18 0546 06/24/18 0941  BP:  (!) 145/75 (!) 146/84   Pulse:  86 64   Resp:  18    Temp:  97.8 F (36.6 C) (!) 97.5 F (36.4 C)   TempSrc:  Oral Oral   SpO2: 100% 99% 92% 92%  Weight:      Height:        Intake/Output Summary (Last 24 hours) at 06/24/2018 1026 Last data filed at 06/24/2018 0918 Gross per 24 hour  Intake 120 ml  Output -  Net 120 ml   Filed Weights   06/21/18 2031  Weight: 41.7 kg    Exam: General exam: In no acute distress. Respiratory system: Good  air movement and clear to auscultation. Cardiovascular system: S1 & S2 heard, RRR.  Gastrointestinal system: Abdomen is nondistended, soft and nontender.  Central nervous system: Alert and oriented. No focal neurological deficits. Extremities: No pedal edema. Skin: No rashes, lesions or ulcers Psychiatry: Judgement and insight appear normal. Mood & affect appropriate.    Data Reviewed:    Labs: Basic Metabolic Panel: Recent Labs  Lab 06/21/18 1542 06/22/18 0522 06/23/18 0547 06/24/18 0552  NA 139 142 142 142  K 3.5 2.8* 4.0 4.2  CL 99 103 107 106  CO2 29 26 26 28   GLUCOSE 137* 136* 135* 125*  BUN 11 15 24* 31*  CREATININE 0.81 0.82 0.77 0.93  CALCIUM 8.9 8.9 9.2 9.1  MG  --  1.9 2.1 2.1    GFR Estimated Creatinine Clearance: 34.4 mL/min (by C-G formula based on SCr of 0.93 mg/dL). Liver Function Tests: Recent Labs  Lab 06/21/18 1542  AST 16  ALT 7  ALKPHOS 106  BILITOT 0.8  PROT 7.4  ALBUMIN 3.5   Recent Labs  Lab 06/21/18 1542  LIPASE 21   No results for input(s): AMMONIA in the last 168 hours. Coagulation profile No results for input(s): INR, PROTIME in the last 168 hours.  CBC: Recent Labs  Lab 06/21/18 1542 06/22/18 0522 06/23/18 0547 06/24/18 0552  WBC 14.8* 11.1* 13.3* 11.0*  NEUTROABS 13.5* 10.3* 12.4* 10.0*  HGB 10.7* 10.0* 10.1* 10.3*  HCT 36.2 33.6* 34.7* 36.1  MCV 87.7 88.0 90.4 90.9  PLT 250 241 257 237   Cardiac Enzymes: No results for input(s): CKTOTAL, CKMB, CKMBINDEX, TROPONINI in the last 168 hours. BNP (last 3 results) No results for input(s): PROBNP in the last 8760 hours. CBG: No results for input(s): GLUCAP in the last 168 hours. D-Dimer: No results for input(s): DDIMER in the last 72 hours. Hgb A1c: No results for input(s): HGBA1C in the last 72 hours. Lipid Profile: No results for input(s): CHOL, HDL, LDLCALC, TRIG, CHOLHDL, LDLDIRECT in the last 72 hours. Thyroid function studies: No results for input(s): TSH, T4TOTAL, T3FREE, THYROIDAB in the last 72 hours.  Invalid input(s): FREET3 Anemia work up: No results for input(s): VITAMINB12, FOLATE, FERRITIN, TIBC, IRON, RETICCTPCT in the last 72 hours. Sepsis Labs: Recent Labs  Lab 06/21/18 1542 06/21/18 1548 06/21/18 1748 06/22/18 0522 06/23/18 0547 06/24/18 0552  WBC 14.8*  --   --  11.1* 13.3* 11.0*  LATICACIDVEN  --  1.06 1.96*  --   --   --    Microbiology Recent Results (from the past 240 hour(s))  Culture, blood (routine x 2)     Status: None (Preliminary result)   Collection Time: 06/21/18  5:47 PM  Result Value Ref Range Status   Specimen Description   Final    BLOOD RIGHT ANTECUBITAL Performed at [facility], [place] [address] [place], [place] [address]    Special Requests   Final    BOTTLES DRAWN AEROBIC AND ANAEROBIC Blood Culture adequate volume Performed at [facility] [address], [place], [place] [address]    Culture   Final    NO GROWTH 3 DAYS Performed at [facility], [place] [address], [place], [place] [address]    Report Status PENDING  Incomplete  Culture, blood (routine x 2)     Status: None (Preliminary result)   Collection Time: 06/21/18  5:51 PM  Result Value Ref Range Status   Specimen Description   Final  BLOOD RIGHT WRIST Performed at [facility] [address] [place], [place] [address]    Special Requests   Final    BOTTLES DRAWN AEROBIC AND ANAEROBIC Blood Culture adequate volume Performed at [facility] [address], [place], [place] [address]    Culture   Final    NO GROWTH 3 DAYS Performed at [facility], [place] [address], [place], [place] [address]    Report Status PENDING  Incomplete     Medications:   . azithromycin  250 mg Oral q1800  . enoxaparin (LOVENOX) injection  30 mg Subcutaneous Q24H  . feeding supplement (ENSURE ENLIVE)  237 mL Oral Q24H  . guaiFENesin  1,200 mg Oral BID  . ipratropium-albuterol  3 mL Nebulization TID  . methylPREDNISolone (SOLU-MEDROL) injection  60 mg Intravenous Q6H  . rOPINIRole  2 mg Oral QHS  . sertraline  100 mg Oral QHS   Continuous Infusions:    LOS: 3 days   Charlynne Cousins  [facility] Pager [phone]  *Please refer to [email or web address], password [email or web address] to get updated schedule on who will round on this patient, as hospitalists switch teams weekly. If 7PM-7AM, please contact night-coverage at [email or web address], password [email or web address] for any overnight needs.  06/24/2018, 10:26 AM

## 2018-06-24 NOTE — Progress Notes (Signed)
Physical Therapy Treatment Patient Details Name: Erica York MRN: [id number] DOB: Oct 27, 1940 Today's Date: 06/24/2018    History of Present Illness 77 y.o. 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 Suggestive of COPD exacerbation although cannot rule out pneumonia based on CXR     PT Comments    Initiated stair training today. o2 95% on 3 L/min with stairs.   Follow Up Recommendations  Home health PT     Equipment Recommendations  None recommended by PT    Recommendations for Other Services       Precautions / Restrictions Precautions Precautions: Fall;Other (comment) Precaution Comments: monitor O2; pt reports 1 fall about 4 weeks ago related to medication change, denies other falls in past 1 year, uses 2L O2 at home Restrictions Weight Bearing Restrictions: No    Mobility  Bed Mobility                  Transfers   Equipment used: None   Sit to Stand: Supervision         General transfer comment: helped to manage 02 tubing and IV pole when ambulating  Ambulation/Gait Ambulation/Gait assistance: Supervision Gait Distance (Feet): 400 Feet Assistive device: IV Pole Gait Pattern/deviations: WFL(Within Functional Limits)     General Gait Details: steady, no loss of balance, SaO2 93% on 3L O2, HR 98   Stairs Stairs: Yes Stairs assistance: Supervision Stair Management: One rail Left;Alternating pattern Number of Stairs: 2(x 4 reps) General stair comments: Limited by IV pole. A bit more winded after stairs than gait. o2 95% on 3 L/min    Wheelchair Mobility    Modified Rankin (Stroke Patients Only)       Balance             Standing balance-Leahy Scale: Fair                              Cognition Arousal/Alertness: Awake/alert Behavior During Therapy: WFL for tasks assessed/performed Overall Cognitive Status: Within Functional  Limits for tasks assessed                                        Exercises      General Comments        Pertinent Vitals/Pain Pain Assessment: No/denies pain    Home Living                      Prior Function            PT Goals (current goals can now be found in the care plan section) Acute Rehab PT Goals Patient Stated Goal: Go home PT Goal Formulation: With patient Time For Goal Achievement: 07/06/18 Potential to Achieve Goals: Good Progress towards PT goals: Progressing toward goals    Frequency    Min 3X/week      PT Plan Current plan remains appropriate    Co-evaluation              AM-PAC PT "6 Clicks" Daily Activity  Outcome Measure  Difficulty turning over in bed (including adjusting bedclothes, sheets and blankets)?: None Difficulty moving from lying on back to sitting on the side of the bed? : None Difficulty sitting down on and standing up from a chair  with arms (e.g., wheelchair, bedside commode, etc,.)?: A Little Help needed moving to and from a bed to chair (including a wheelchair)?: A Little Help needed walking in hospital room?: A Little Help needed climbing 3-5 steps with a railing? : A Little 6 Click Score: 20    End of Session Equipment Utilized During Treatment: Gait belt;Oxygen Activity Tolerance: Patient tolerated treatment well Patient left: in bed;with call bell/phone within reach;Other (comment)(sitting EOB with respiratory therapy) Nurse Communication: Mobility status PT Visit Diagnosis: Difficulty in walking, not elsewhere classified (R26.2)     Time: 6725-5001 PT Time Calculation (min) (ACUTE ONLY): 24 min  Charges:  $Gait Training: 23-37 mins                     Anu Stagner L. Tamala Julian, [place] Pager [phone] 06/24/2018    Galen [place] 06/24/2018, 12:42 PM

## 2018-06-24 NOTE — Progress Notes (Signed)
PT continues to utilize Flutter device- non productive cough at this time.

## 2018-06-24 NOTE — Progress Notes (Signed)
Assumed care of patient at this time. Patient is stable with no complaints at this time. Agree with previously documented assessment. Will continue to monitor patient.   

## 2018-06-24 NOTE — Progress Notes (Signed)
PT continues to utilize Flutter device. Non productive cough at this time. 

## 2018-06-24 NOTE — Progress Notes (Signed)
Occupational Therapy Treatment Patient Details Name: Erica York MRN: [id number] DOB: Feb 11, 1941 Today's Date: 06/24/2018    History of present illness 77 y.o. 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 Suggestive of COPD exacerbation although cannot rule out pneumonia based on CXR    OT comments  Performed ADL with set up/supervision.  Reviewed energy conservation and back precautions to decrease back pain/strain during activities  Follow Up Recommendations  No OT follow up;Supervision - Intermittent    Equipment Recommendations  None recommended by OT    Recommendations for Other Services      Precautions / Restrictions Precautions Precautions: Fall;Other (comment) Precaution Comments: monitor O2; pt reports 1 fall about 4 weeks ago related to medication change, denies other falls in past 1 year, uses 2L O2 at home Restrictions Weight Bearing Restrictions: No       Mobility Bed Mobility                  Transfers   Equipment used: None   Sit to Stand: Supervision         General transfer comment: helped to manage 02 tubing and IV pole when ambulating    Balance                                           ADL either performed or assessed with clinical judgement   ADL                                         General ADL Comments: pt was up in bathroom with nursing performing ADL. She is able to perform this at set up/supervision level.  Pt verbalizes 2 EC strategies and initiates rest breaks as needed. Provided handouts. She doesn't want DME for her tub, but she has grab bars.  Educated that she can also sit on commode for feet if she needs to rest. She is very active at home, including scrubbing floors.  Pt has back pain since fall. Reviewed precautions to decrease strain.  Pt reports that she rolls for bed mobility     Vision       Perception     Praxis      Cognition Arousal/Alertness: Awake/alert Behavior During Therapy: WFL for tasks assessed/performed Overall Cognitive Status: Within Functional Limits for tasks assessed                                          Exercises     Shoulder Instructions       General Comments      Pertinent Vitals/ Pain       Pain Assessment: No/denies pain  Home Living                                          Prior Functioning/Environment              Frequency           Progress Toward Goals  OT Goals(current goals can now  be found in the care plan section)  Progress towards OT goals: Progressing toward goals     Plan      Co-evaluation                 AM-PAC PT "6 Clicks" Daily Activity     Outcome Measure   Help from another person eating meals?: None Help from another person taking care of personal grooming?: A Little Help from another person toileting, which includes using toliet, bedpan, or urinal?: A Little Help from another person bathing (including washing, rinsing, drying)?: A Little Help from another person to put on and taking off regular upper body clothing?: A Little Help from another person to put on and taking off regular lower body clothing?: A Little 6 Click Score: 19    End of Session    OT Visit Diagnosis: Muscle weakness (generalized) (M62.81)   Activity Tolerance Patient tolerated treatment well   Patient Left in chair;with call bell/phone within reach   Nurse Communication          Time: 1216-2446 OT Time Calculation (min): 23 min  Charges: OT General Charges $OT Visit: 1 Visit OT Treatments $Self Care/Home Management : 8-22 mins  Lesle Chris, OTR/L Acute Rehabilitation Services [phone] [facility] pager [phone] office 06/24/2018   [place] 06/24/2018, 9:51 AM

## 2018-06-25 ENCOUNTER — Inpatient Hospital Stay (HOSPITAL_COMMUNITY): Payer: Medicare Other

## 2018-06-25 ENCOUNTER — Encounter (HOSPITAL_COMMUNITY): Payer: Self-pay | Admitting: Radiology

## 2018-06-25 DIAGNOSIS — K56609 Unspecified intestinal obstruction, unspecified as to partial versus complete obstruction: Secondary | ICD-10-CM

## 2018-06-25 LAB — CBC WITH DIFFERENTIAL/PLATELET
Abs Immature Granulocytes: 0.58 10*3/uL — ABNORMAL HIGH (ref 0.00–0.07)
BASOS ABS: 0.1 10*3/uL (ref 0.0–0.1)
Basophils Relative: 1 %
EOS ABS: 0 10*3/uL (ref 0.0–0.5)
EOS PCT: 0 %
HCT: 43.8 % (ref 36.0–46.0)
Hemoglobin: 12.8 g/dL (ref 12.0–15.0)
Immature Granulocytes: 5 %
LYMPHS ABS: 0.7 10*3/uL (ref 0.7–4.0)
Lymphocytes Relative: 6 %
MCH: 26.1 pg (ref 26.0–34.0)
MCHC: 29.2 g/dL — AB (ref 30.0–36.0)
MCV: 89.2 fL (ref 80.0–100.0)
Monocytes Absolute: 0.5 10*3/uL (ref 0.1–1.0)
Monocytes Relative: 4 %
NRBC: 0 % (ref 0.0–0.2)
Neutro Abs: 10.3 10*3/uL — ABNORMAL HIGH (ref 1.7–7.7)
Neutrophils Relative %: 84 %
Platelets: 262 10*3/uL (ref 150–400)
RBC: 4.91 MIL/uL (ref 3.87–5.11)
RDW: 19.4 % — AB (ref 11.5–15.5)
WBC: 12.1 10*3/uL — ABNORMAL HIGH (ref 4.0–10.5)

## 2018-06-25 LAB — URINALYSIS, ROUTINE W REFLEX MICROSCOPIC
BILIRUBIN URINE: NEGATIVE
Bacteria, UA: NONE SEEN
GLUCOSE, UA: NEGATIVE mg/dL
KETONES UR: NEGATIVE mg/dL
LEUKOCYTES UA: NEGATIVE
NITRITE: NEGATIVE
PH: 6 (ref 5.0–8.0)
Protein, ur: 30 mg/dL — AB
SPECIFIC GRAVITY, URINE: 1.014 (ref 1.005–1.030)

## 2018-06-25 LAB — MAGNESIUM: MAGNESIUM: 2.1 mg/dL (ref 1.7–2.4)

## 2018-06-25 MED ORDER — MORPHINE SULFATE (PF) 2 MG/ML IV SOLN
2.0000 mg | INTRAVENOUS | Status: DC | PRN
  Administered 2018-06-25 – 2018-06-27 (×10): 2 mg via INTRAVENOUS
  Filled 2018-06-25 (×10): qty 1

## 2018-06-25 MED ORDER — SODIUM CHLORIDE 0.9 % IV SOLN
500.0000 mg | INTRAVENOUS | Status: DC
  Administered 2018-06-25 – 2018-06-27 (×3): 500 mg via INTRAVENOUS
  Filled 2018-06-25 (×4): qty 500

## 2018-06-25 MED ORDER — IOHEXOL 300 MG/ML  SOLN
30.0000 mL | Freq: Once | INTRAMUSCULAR | Status: DC | PRN
  Administered 2018-06-25: 30 mL via ORAL
  Filled 2018-06-25: qty 30

## 2018-06-25 MED ORDER — POLYETHYLENE GLYCOL 3350 17 G PO PACK: 17.0000 g | PACK | Freq: Two times a day (BID) | ORAL | Status: DC

## 2018-06-25 MED ORDER — SODIUM CHLORIDE (PF) 0.9 % IJ SOLN
INTRAMUSCULAR | Status: AC
  Administered 2018-06-25: 17:00:00
  Filled 2018-06-25: qty 50

## 2018-06-25 MED ORDER — IOHEXOL 300 MG/ML  SOLN
100.0000 mL | Freq: Once | INTRAMUSCULAR | Status: AC | PRN
  Administered 2018-06-25: 90 mL via INTRAVENOUS

## 2018-06-25 MED ORDER — METHYLPREDNISOLONE SODIUM SUCC 40 MG IJ SOLR
40.0000 mg | Freq: Two times a day (BID) | INTRAMUSCULAR | Status: DC
  Administered 2018-06-26 – 2018-06-27 (×3): 40 mg via INTRAVENOUS
  Filled 2018-06-25 (×3): qty 1

## 2018-06-25 MED ORDER — DIATRIZOATE MEGLUMINE & SODIUM 66-10 % PO SOLN
90.0000 mL | Freq: Once | ORAL | Status: AC
  Administered 2018-06-25: 90 mL via NASOGASTRIC
  Filled 2018-06-25: qty 90

## 2018-06-25 NOTE — Progress Notes (Signed)
PT Cancellation Note  Patient Details Name: Erica York MRN: [id number] DOB: 08/28/1940   Cancelled Treatment:    Reason Eval/Treat Not Completed: Pain limiting ability to participate   [facility] 06/25/2018, 2:42 PM

## 2018-06-25 NOTE — Progress Notes (Addendum)
[facility] PROGRESS NOTE    Progress Note  Erica York  VOJ:[id number] DOB: May 07, 1941 DOA: 06/21/2018 PCP: Heywood Bene, PA-C     Brief Narrative:   Erica York is an 77 y.o. female past medical history of COPD oxygen dependent, essential hypertension comes in with progressive dyspnea and productive cough.  Assessment/Plan:   COPD exacerbation [facility]): She uses 2 L of oxygen at home she seems to be at baseline as per patient. Cont. oral steroids antibiotics continue inhalers.  Possible lobar pneumonia: Cont oral azithromycin.  Hypokalemia: Repleted orally, will continue to monitor intermittently. Magnesium was 2.1.  Major depression in complete remission (HCC) Cont meds.  New High grade SBO: Urinalysis showed no signs of infection. CT scan of the abdomen and pelvis was done on 06/25/2018 that showed high-grade small bowel obstruction not identified point of obstruction there is no edema or perforation.  We will place her n.p.o. start on the small bowel protocol. She is currently not nauseated no vomiting will hold on on the NG tube. Check a CBC with differential.   DVT prophylaxis: lovenxo Family Communication:none Disposition Plan/Barrier to D/C: home in 1-2 days Code Status:     Code Status Orders  (From admission, onward)         Start     Ordered   06/21/18 1911  Do not attempt resuscitation (DNR)  Continuous    Question Answer Comment  In the event of cardiac or respiratory ARREST Do not call a "code blue"   In the event of cardiac or respiratory ARREST Do not perform Intubation, CPR, defibrillation or ACLS   In the event of cardiac or respiratory ARREST Use medication by any route, position, wound care, and other measures to relive pain and suffering. May use oxygen, suction and manual treatment of airway obstruction as needed for comfort.      06/21/18 1917        Code Status History    Date Active Date Inactive Code Status Order  ID Comments User Context   06/08/2018 1326 06/08/2018 2159 DNR [id number]  Jimmy Footman, NP Inpatient   06/07/2018 0018 06/08/2018 1326 Full Code [id number]  Jani Gravel, MD ED   04/23/2018 0017 04/25/2018 2028 Full Code [id number]  Etta Quill, DO ED   10/07/2017 0336 10/12/2017 1538 Full Code [id number]  Bethena Roys, MD Inpatient   08/01/2017 1349 08/08/2017 1417 Full Code [id number]  Erick Colace, NP ED    Advance Directive Documentation     Most Recent Value  Type of Advance Directive  Healthcare Power of Attorney, Living will  Pre-existing out of facility DNR order (yellow form or pink MOST form)  -  "MOST" Form in Place?  -        IV Access:    Peripheral IV   Procedures and diagnostic studies:   No results found.   Medical Consultants:    None.  Anti-Infectives:   Azithro  Subjective:    Erica York she relates her breathing is better but she is now complaining of lower abdominal pain no specific site no rebound or guarding. She relates her last bowel movement was on 06/24/2018 she tolerated her breakfast this morning..  Objective:    Vitals:   06/24/18 2139 06/24/18 2220 06/25/18 0550 06/25/18 0828  BP:  (!) 176/93 (!) 188/88   Pulse:  79 79   Resp:  18 18   Temp:  98.2 F (36.8 C) 98.2 F (36.8 C)   TempSrc:  Oral Oral   SpO2: 95% 94% 99% 97%  Weight:      Height:       No intake or output data in the 24 hours ending 06/25/18 1027 Filed Weights   06/21/18 2031  Weight: 41.7 kg    Exam: General exam: In no acute distress. Respiratory system: Good air movement and clear to auscultation. Cardiovascular system: S1 & S2 heard, RRR.  Gastrointestinal system: Abdomen is nondistended, soft and nontender.  Central nervous system: Alert and oriented. No focal neurological deficits. Extremities: No pedal edema. Skin: No rashes, lesions or ulcers Psychiatry: Judgement and insight appear normal. Mood & affect appropriate.      Data Reviewed:    Labs: Basic Metabolic Panel: Recent Labs  Lab 06/21/18 1542 06/22/18 0522 06/23/18 0547 06/24/18 0552 06/25/18 0634  NA 139 142 142 142  --   K 3.5 2.8* 4.0 4.2  --   CL 99 103 107 106  --   CO2 29 26 26 28   --   GLUCOSE 137* 136* 135* 125*  --   BUN 11 15 24* 31*  --   CREATININE 0.81 0.82 0.77 0.93  --   CALCIUM 8.9 8.9 9.2 9.1  --   MG  --  1.9 2.1 2.1 2.1   GFR Estimated Creatinine Clearance: 34.4 mL/min (by C-G formula based on SCr of 0.93 mg/dL). Liver Function Tests: Recent Labs  Lab 06/21/18 1542  AST 16  ALT 7  ALKPHOS 106  BILITOT 0.8  PROT 7.4  ALBUMIN 3.5   Recent Labs  Lab 06/21/18 1542  LIPASE 21   No results for input(s): AMMONIA in the last 168 hours. Coagulation profile No results for input(s): INR, PROTIME in the last 168 hours.  CBC: Recent Labs  Lab 06/21/18 1542 06/22/18 0522 06/23/18 0547 06/24/18 0552  WBC 14.8* 11.1* 13.3* 11.0*  NEUTROABS 13.5* 10.3* 12.4* 10.0*  HGB 10.7* 10.0* 10.1* 10.3*  HCT 36.2 33.6* 34.7* 36.1  MCV 87.7 88.0 90.4 90.9  PLT 250 241 257 237   Cardiac Enzymes: No results for input(s): CKTOTAL, CKMB, CKMBINDEX, TROPONINI in the last 168 hours. BNP (last 3 results) No results for input(s): PROBNP in the last 8760 hours. CBG: No results for input(s): GLUCAP in the last 168 hours. D-Dimer: No results for input(s): DDIMER in the last 72 hours. Hgb A1c: No results for input(s): HGBA1C in the last 72 hours. Lipid Profile: No results for input(s): CHOL, HDL, LDLCALC, TRIG, CHOLHDL, LDLDIRECT in the last 72 hours. Thyroid function studies: No results for input(s): TSH, T4TOTAL, T3FREE, THYROIDAB in the last 72 hours.  Invalid input(s): FREET3 Anemia work up: No results for input(s): VITAMINB12, FOLATE, FERRITIN, TIBC, IRON, RETICCTPCT in the last 72 hours. Sepsis Labs: Recent Labs  Lab 06/21/18 1542 06/21/18 1548 06/21/18 1748 06/22/18 0522 06/23/18 0547 06/24/18 0552  WBC  14.8*  --   --  11.1* 13.3* 11.0*  LATICACIDVEN  --  1.06 1.96*  --   --   --    Microbiology Recent Results (from the past 240 hour(s))  Culture, blood (routine x 2)     Status: None (Preliminary result)   Collection Time: 06/21/18  5:47 PM  Result Value Ref Range Status   Specimen Description   Final    BLOOD RIGHT ANTECUBITAL Performed at [facility], [place] [address], [place], [place] [address]    Special Requests   Final    BOTTLES DRAWN AEROBIC AND ANAEROBIC Blood Culture adequate  volume Performed at [facility], [place] [address] [place], [place] [address]    Culture   Final    NO GROWTH 3 DAYS Performed at [facility], [place] [address], [place], [place] [address]    Report Status PENDING  Incomplete  Culture, blood (routine x 2)     Status: None (Preliminary result)   Collection Time: 06/21/18  5:51 PM  Result Value Ref Range Status   Specimen Description   Final    BLOOD RIGHT WRIST Performed at [facility] [address], [place], [place] [address]    Special Requests   Final    BOTTLES DRAWN AEROBIC AND ANAEROBIC Blood Culture adequate volume Performed at [facility] [address], [place], [place] [address]    Culture   Final    NO GROWTH 3 DAYS Performed at [facility], [place] [address], [place], [place] [address]    Report Status PENDING  Incomplete     Medications:   . enoxaparin (LOVENOX) injection  30 mg Subcutaneous Q24H  . feeding supplement (ENSURE ENLIVE)  237 mL Oral Q24H  . guaiFENesin  1,200 mg Oral BID  . ipratropium-albuterol  3 mL Nebulization TID  . methylPREDNISolone (SOLU-MEDROL) injection  60 mg Intravenous Q6H  . rOPINIRole  2 mg Oral QHS  . sertraline  100 mg Oral QHS   Continuous Infusions:    LOS: 4 days   Charlynne Cousins  [facility] Pager [phone]  *Please refer to [email or web address], password [email or web address] to get updated schedule on who  will round on this patient, as hospitalists switch teams weekly. If 7PM-7AM, please contact night-coverage at [email or web address], password [email or web address] for any overnight needs.  06/25/2018, 10:27 AM

## 2018-06-26 ENCOUNTER — Inpatient Hospital Stay (HOSPITAL_COMMUNITY): Payer: Medicare Other

## 2018-06-26 LAB — BASIC METABOLIC PANEL
ANION GAP: 14 (ref 5–15)
BUN: 26 mg/dL — ABNORMAL HIGH (ref 8–23)
CALCIUM: 9.4 mg/dL (ref 8.9–10.3)
CO2: 31 mmol/L (ref 22–32)
Chloride: 94 mmol/L — ABNORMAL LOW (ref 98–111)
Creatinine, Ser: 0.95 mg/dL (ref 0.44–1.00)
GFR calc non Af Amer: 57 mL/min — ABNORMAL LOW (ref >60)
GLUCOSE: 117 mg/dL — AB (ref 70–99)
POTASSIUM: 3.4 mmol/L — AB (ref 3.5–5.1)
Sodium: 139 mmol/L (ref 135–145)

## 2018-06-26 LAB — CULTURE, BLOOD (ROUTINE X 2)
Culture: NO GROWTH
Culture: NO GROWTH
SPECIAL REQUESTS: ADEQUATE
Special Requests: ADEQUATE

## 2018-06-26 LAB — MAGNESIUM: MAGNESIUM: 2.5 mg/dL — AB (ref 1.7–2.4)

## 2018-06-26 MED ORDER — LABETALOL HCL 5 MG/ML IV SOLN
10.0000 mg | Freq: Once | INTRAVENOUS | Status: AC
  Administered 2018-06-26: 10 mg via INTRAVENOUS
  Filled 2018-06-26: qty 4

## 2018-06-26 MED ORDER — BUTAMBEN-TETRACAINE-BENZOCAINE 2-2-14 % EX AERO
INHALATION_SPRAY | CUTANEOUS | Status: AC
  Filled 2018-06-26: qty 20

## 2018-06-26 MED ORDER — LORAZEPAM 2 MG/ML IJ SOLN: 0.5000 mg | Freq: Two times a day (BID) | INTRAMUSCULAR | Status: DC | PRN

## 2018-06-26 MED ORDER — POTASSIUM CHLORIDE 10 MEQ/100ML IV SOLN
10.0000 meq | INTRAVENOUS | Status: AC
  Administered 2018-06-26 (×5): 10 meq via INTRAVENOUS
  Filled 2018-06-26 (×5): qty 100

## 2018-06-26 MED ORDER — HYDROMORPHONE HCL 1 MG/ML IJ SOLN
0.5000 mg | Freq: Once | INTRAMUSCULAR | Status: AC
  Administered 2018-06-26: 0.5 mg via INTRAVENOUS
  Filled 2018-06-26: qty 0.5

## 2018-06-26 MED ORDER — LORAZEPAM 2 MG/ML IJ SOLN
1.0000 mg | INTRAMUSCULAR | Status: DC | PRN
  Administered 2018-06-26: 1 mg via INTRAVENOUS
  Filled 2018-06-26: qty 1

## 2018-06-26 MED ORDER — HALOPERIDOL LACTATE 5 MG/ML IJ SOLN: 1.0000 mg | Freq: Four times a day (QID) | INTRAMUSCULAR | Status: DC | PRN

## 2018-06-26 MED ORDER — LIDOCAINE HCL URETHRAL/MUCOSAL 2 % EX GEL
CUTANEOUS | Status: AC
  Filled 2018-06-26: qty 30

## 2018-06-26 MED ORDER — BISACODYL 10 MG RE SUPP
10.0000 mg | Freq: Once | RECTAL | Status: AC
  Administered 2018-06-26: 10 mg via RECTAL
  Filled 2018-06-26: qty 1

## 2018-06-26 MED ORDER — HYDRALAZINE HCL 20 MG/ML IJ SOLN
5.0000 mg | INTRAMUSCULAR | Status: DC | PRN
  Administered 2018-06-26: 5 mg via INTRAVENOUS
  Filled 2018-06-26 (×2): qty 1

## 2018-06-26 MED ORDER — IOPAMIDOL (ISOVUE-300) INJECTION 61%
INTRAVENOUS | Status: AC
  Filled 2018-06-26: qty 50

## 2018-06-26 MED ORDER — SODIUM CHLORIDE 0.45 % IV SOLN
INTRAVENOUS | Status: DC
  Administered 2018-06-26 (×2): via INTRAVENOUS
  Filled 2018-06-26 (×4): qty 1000

## 2018-06-26 MED ORDER — POTASSIUM CHLORIDE CRYS ER 20 MEQ PO TBCR: 40.0000 meq | EXTENDED_RELEASE_TABLET | Freq: Two times a day (BID) | ORAL | Status: DC

## 2018-06-26 NOTE — Progress Notes (Signed)
Patient requested that all 4 side rails be up at this time. Will continue to monitor patient.  Erica York

## 2018-06-26 NOTE — Consult Note (Signed)
[facility] Surgery Consult Note  Erica York 1941/04/15  [id number].    Requesting MD: Aileen Fass Chief Complaint/Reason for Consult: SBO  HPI:  Patient is a 77 year old female with multiple medical problems who was admitted to [facility] for COPD exacerbation. During hospitalization developed abdominal pain, nausea, and abdominal distention. Patient reports last BM was Monday. She has a history of chronic diarrhea normally and has intermittent bloody stools. Patient has had multiple past abdominal surgeries including abdominal hysterectomy, multiple colonic resections for diverticulitis, a left nephrectomy, and hernia repair with mesh in upper abdomen. Patient currently reports severe abdominal distention, no BM or flatus in several days. She reports nausea but was told not to throw up. Abdominal pain is mostly in epigastric region and feels like a sharp cramping pain. She has not been mobilizing much in the hospital but gets around fairly well at home. She reports she drank some contrast yesterday without change in abdominal symptoms. Patient has COPD and wears 2L of O2 at home chronically. She is allergic to zosyn.   ROS: Review of Systems  Constitutional: Positive for malaise/fatigue.  Respiratory: Positive for shortness of breath (chronic).   Cardiovascular: Negative for chest pain and palpitations.  Gastrointestinal: Positive for abdominal pain, blood in stool, constipation and nausea. Negative for vomiting.  All other systems reviewed and are negative.   History reviewed. No pertinent family history.  Past Medical History:  Diagnosis Date  . Anemia   . Arthritis    hands  . Bladder cancer ([place])   . Chronic diarrhea   . Chronic gastritis   . COPD (chronic obstructive pulmonary disease) ([place])    per pt uses neb. as needed  . Depression   . Diverticulosis of colon   . Duodenal ulcer 08/01/2017   multiple per EGD  . GAD (generalized anxiety disorder)   . GERD  (gastroesophageal reflux disease)   . Hiatal hernia   . History of bladder cancer 2017   s/p  TURBT's twice   . History of colonic diverticulitis   . History of GI bleed 08/01/2017   and 10-06-2017 upper GI bleed due to multiple duonenal ulcer , duodenitis , gastritis (transfused 10-06-2017)  . History of renal cell carcinoma    dx 2016---  s/p  left nephrectomy  . Hyperlipidemia   . Osteoporosis   . RLS (restless legs syndrome)   . 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 Urge incontinence of urine     Past Surgical History:  Procedure Laterality Date  . ABDOMINAL HYSTERECTOMY  1973   w/ BSO and Appendectomy  . COLON SURGERY  2015   in [place]   "my colon was reconstructed"  . ESOPHAGOGASTRODUODENOSCOPY N/A 08/06/2017   Procedure: ESOPHAGOGASTRODUODENOSCOPY (EGD);  Surgeon: Clarene Essex, MD;  Location: [facility];  Service: Endoscopy;  Laterality: N/A;  . ESOPHAGOGASTRODUODENOSCOPY (EGD) WITH PROPOFOL Left 10/08/2017   Procedure: ESOPHAGOGASTRODUODENOSCOPY (EGD) WITH PROPOFOL;  Surgeon: Arta Silence, MD;  Location: [facility];  Service: Endoscopy;  Laterality: Left;  . HEMICOLECTOMY  x3  last one 2010   last one w/ ostomy (diverticulitis)  . INGUINAL HERNIA REPAIR Left 1980s  . LAPAROSCOPIC CHOLECYSTECTOMY  1990s  . LEFT NEPHRECTOMY  2016   in [place]   renal cell carcinoma  . TRANSURETHRAL RESECTION OF BLADDER TUMOR  x2  2017  in [place], [place]  . TRANSURETHRAL RESECTION OF BLADDER TUMOR N/A 03/18/2018   Procedure: TRANSURETHRAL RESECTION OF BLADDER TUMOR (TURBT),  WITH CYSTOSCOPY, CYSTOGRAM, INSTILATION OF GEMCITABINE;  Surgeon: Ceasar Mons, MD;  Location: [facility];  Service: Urology;  Laterality: N/A;    Social History:  reports that she has been smoking cigarettes. She has smoked for the past 60.00 years. She has never used smokeless tobacco. She reports that she does not drink alcohol or use drugs.  Allergies:   Allergies  Allergen Reactions  . Zosyn [Piperacillin Sod-Tazobactam So] Swelling and Rash    Medications Prior to Admission  Medication Sig Dispense Refill  . albuterol (PROVENTIL HFA;VENTOLIN HFA) 108 (90 Base) MCG/ACT inhaler Inhale 2 puffs into the lungs every 6 (six) hours as needed for wheezing or shortness of breath. 1 Inhaler 0  . ALPRAZolam (XANAX) 0.5 MG tablet Take 1 tablet (0.5 mg total) by mouth 2 (two) times daily as needed for anxiety.    . iron polysaccharides (NIFEREX) 150 MG capsule Take 1 capsule (150 mg total) by mouth daily. 30 capsule 0  . mometasone-formoterol (DULERA) 200-5 MCG/ACT AERO Inhale 2 puffs into the lungs 2 (two) times daily. 1 Inhaler 0  . pantoprazole (PROTONIX) 40 MG tablet Take 1 tablet (40 mg total) by mouth 2 (two) times daily before a meal. After 1 month take 1 daily in the morning (Patient taking differently: Take 40 mg by mouth daily. ) 60 tablet 0  . propranolol (INDERAL) 40 MG tablet Take 1 tablet (40 mg total) by mouth 2 (two) times daily. 60 tablet 0  . rOPINIRole (REQUIP) 2 MG tablet Take 2 mg by mouth at bedtime.    . sertraline (ZOLOFT) 100 MG tablet Take 1 tablet (100 mg total) by mouth at bedtime. 30 tablet 1  . traMADol-acetaminophen (ULTRACET) 37.5-325 MG tablet Take 1 tablet by mouth every 6 (six) hours as needed for moderate pain. for pain  5  . dextromethorphan-guaiFENesin (MUCINEX DM) 30-600 MG 12hr tablet Take 1 tablet by mouth 2 (two) times daily as needed for cough. (Patient not taking: Reported on 06/06/2018)    . sucralfate (CARAFATE) 1 GM/10ML suspension Take 10 mLs (1 g total) by mouth 4 (four) times daily -  with meals and at bedtime. (Patient not taking: Reported on 06/06/2018) 420 mL 0  . tiotropium (SPIRIVA HANDIHALER) 18 MCG inhalation capsule Place 1 capsule (18 mcg total) into inhaler and inhale daily. (Patient not taking: Reported on 06/06/2018) 30 capsule 0    Blood pressure (!) 166/99, pulse 73, temperature 97.9 F  (36.6 C), temperature source Oral, resp. rate 20, height 4' 11"  (1.499 m), weight 41.7 kg, SpO2 98 %. Physical Exam: Physical Exam  Constitutional: She is oriented to person, place, and time. She appears well-developed. She appears cachectic. She is cooperative.  Non-toxic appearance. She appears ill (chronically ill appearing, obvious discomfort).  HENT:  Head: Normocephalic and atraumatic.  Right Ear: External ear normal.  Left Ear: External ear normal.  Nose: Nose normal.  Mouth/Throat: Mucous membranes are dry.  Eyes: Pupils are equal, round, and reactive to light. Conjunctivae, EOM and lids are normal. No scleral icterus.  Neck: Normal range of motion. Neck supple.  Cardiovascular: Normal rate and regular rhythm.  Pulses:      Radial pulses are 2+ on the right side, and 2+ on the left side.       Dorsalis pedis pulses are 2+ on the right side, and 2+ on the left side.  Pulmonary/Chest: Accessory muscle usage present. No respiratory distress. She has decreased breath sounds (diminished bilaterally ). She has no  wheezes. She has no rhonchi. She has no rales.  Abdominal: She exhibits distension. Bowel sounds are absent. There is generalized tenderness. There is rigidity. There is no rebound and no guarding.  Patient with marked abdominal distention  Musculoskeletal:  No gross deformities all 4 extremities. ROM grossly intact in bilateral upper extremities  Neurological: She is alert and oriented to person, place, and time.  Skin: Skin is warm, dry and intact.  Psychiatric: Her speech is normal and behavior is normal. Her mood appears anxious.    Results for orders placed or performed during the hospital encounter of 06/21/18 (from the past 48 hour(s))  Magnesium     Status: None   Collection Time: 06/25/18  6:34 AM  Result Value Ref Range   Magnesium 2.1 1.7 - 2.4 mg/dL    Comment: Performed at [facility], [place] [address], [place], [place] [address]  Urinalysis,  Routine w reflex microscopic     Status: Abnormal   Collection Time: 06/25/18  1:05 PM  Result Value Ref Range   Color, Urine YELLOW YELLOW   APPearance CLEAR CLEAR   Specific Gravity, Urine 1.014 1.005 - 1.030   pH 6.0 5.0 - 8.0   Glucose, UA NEGATIVE NEGATIVE mg/dL   Hgb urine dipstick MODERATE (A) NEGATIVE   Bilirubin Urine NEGATIVE NEGATIVE   Ketones, ur NEGATIVE NEGATIVE mg/dL   Protein, ur 30 (A) NEGATIVE mg/dL   Nitrite NEGATIVE NEGATIVE   Leukocytes, UA NEGATIVE NEGATIVE   RBC / HPF 6-10 0 - 5 RBC/hpf   WBC, UA 0-5 0 - 5 WBC/hpf   Bacteria, UA NONE SEEN NONE SEEN   Squamous Epithelial / LPF 0-5 0 - 5   Mucus PRESENT     Comment: Performed at [facility], [place] [address], [place], [place] [address]  CBC with Differential/Platelet     Status: Abnormal   Collection Time: 06/25/18  5:37 PM  Result Value Ref Range   WBC 12.1 (H) 4.0 - 10.5 K/uL   RBC 4.91 3.87 - 5.11 MIL/uL   Hemoglobin 12.8 12.0 - 15.0 g/dL   HCT 43.8 36.0 - 46.0 %   MCV 89.2 80.0 - 100.0 fL   MCH 26.1 26.0 - 34.0 pg   MCHC 29.2 (L) 30.0 - 36.0 g/dL   RDW 19.4 (H) 11.5 - 15.5 %   Platelets 262 150 - 400 K/uL   nRBC 0.0 0.0 - 0.2 %   Neutrophils Relative % 84 %   Neutro Abs 10.3 (H) 1.7 - 7.7 K/uL   Lymphocytes Relative 6 %   Lymphs Abs 0.7 0.7 - 4.0 K/uL   Monocytes Relative 4 %   Monocytes Absolute 0.5 0.1 - 1.0 K/uL   Eosinophils Relative 0 %   Eosinophils Absolute 0.0 0.0 - 0.5 K/uL   Basophils Relative 1 %   Basophils Absolute 0.1 0.0 - 0.1 K/uL   Immature Granulocytes 5 %   Abs Immature Granulocytes 0.58 (H) 0.00 - 0.07 K/uL    Comment: Performed at [facility], [place] [address] [place], [place] [address]  Magnesium     Status: Abnormal   Collection Time: 06/26/18  5:53 AM  Result Value Ref Range   Magnesium 2.5 (H) 1.7 - 2.4 mg/dL    Comment: Performed at [facility], [place] [address], [place], [place] [address]  Basic metabolic panel      Status: Abnormal   Collection Time: 06/26/18  5:53 AM  Result Value Ref  Range   Sodium 139 135 - 145 mmol/L   Potassium 3.4 (L) 3.5 - 5.1 mmol/L   Chloride 94 (L) 98 - 111 mmol/L   CO2 31 22 - 32 mmol/L   Glucose, Bld 117 (H) 70 - 99 mg/dL   BUN 26 (H) 8 - 23 mg/dL   Creatinine, Ser 0.95 0.44 - 1.00 mg/dL   Calcium 9.4 8.9 - 10.3 mg/dL   GFR calc non Af Amer 57 (L) >60 mL/min   GFR calc Af Amer >60 >60 mL/min    Comment: (NOTE) The eGFR has been calculated using the CKD EPI equation. This calculation has not been validated in all clinical situations. eGFR's persistently <60 mL/min signify possible Chronic Kidney Disease.    Anion gap 14 5 - 15    Comment: Performed at [facility], [place] [address], [place], [place] [address]   Ct Abdomen Pelvis W Contrast  Result Date: 06/25/2018 CLINICAL DATA:  Suprapubic abdominal pain, acute onset CT abdomen pelvis of 06/06/2018 . EXAM: CT ABDOMEN AND PELVIS WITH CONTRAST TECHNIQUE: Multidetector CT imaging of the abdomen and pelvis was performed using the standard protocol following bolus administration of intravenous contrast. CONTRAST:  72m OMNIPAQUE IOHEXOL 300 MG/ML SOLN, 315mOMNIPAQUE IOHEXOL 300 MG/ML SOLN COMPARISON:  None. FINDINGS: Lower chest: Chronic emphysematous changes are present at the lung bases as well as linear scarring within the lingula and right lower lobe with atelectasis as well. No pneumonia or pleural effusion is seen. The heart is mildly enlarged. No pericardial effusion is seen. A single calcified granuloma is present at the right lung base just above the hemidiaphragm. Hepatobiliary: The liver enhances with no focal abnormality. Surgical clips are present from prior cholecystectomy and the common bile duct is very prominent as noted previously. At the level of the ampulla of the common bile duct measures 12 mm. Pancreas: The pancreas is normal in size and the pancreatic duct is not dilated. The pancreas  appears relatively atrophic Spleen: The spleen is unremarkable. A splenic cyst or hemangioma is noted medially. Adrenals/Urinary Tract: The adrenal glands appear normal. 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 The right kidney appears compensatory hypertrophied. On delayed images, the pelvocaliceal systems are unremarkable. Small renal cysts are noted. No definite solid right renal lesion is seen. The right ureter appears normal in caliber. The urinary bladder is relatively decompressed and difficult to evaluate. Stomach/Bowel: The stomach is filled with oral contrast with no abnormality evident. There are significantly dilated loops of small bowel in this patient was undergone prior colectomy with only the rectum remaining. The exact point of obstruction partial obstruction is difficult to determine. There are however nondilated loops of small bowel distally gamma and the obstruction most likely is due to and adhesion. No definite edema of small bowel or pneumatosis is seen currently. There are differential air-fluid levels present within the dilated fluid-filled small bowel loops. Some fecalization within dilated distal small bowel loops is noted. No free fluid is noted within the pelvis. Vascular/Lymphatic: The abdominal aorta is normal in caliber with significant abdominal aortic atherosclerosis noted. No adenopathy is seen. Reproductive: The uterus has previously been resected. No adnexal lesion is seen. No fluid is noted within the pelvis. Other: No abdominal wall hernia is seen. Musculoskeletal: The compression deformity of L4 vertebral body is stable. Lumbar curvature convex to the left is unchanged as well. IMPRESSION: 1. Relatively high-grade partial small bowel obstruction with significant dilatation of small bowel loops although there are distal bowel loops  which are not distended. The exact point of obstruction is difficult to determine but most likely is due to an adhesion. No present edema of small bowel  is seen and no pneumatosis is evident. 2. Prior left nephrectomy with compensatory hypertrophy of the right kidney. 3. This report will be called to the referring physician by radiology assistant. Electronically Signed   By: Ivar Drape M.D.   On: 06/25/2018 17:00   Dg Abd Portable 1v-small Bowel Obstruction Protocol-initial, 8 Hr Delay  Result Date: 06/26/2018 CLINICAL DATA:  8 hour post contrast small-bowel protocol. EXAM: PORTABLE ABDOMEN - 1 VIEW COMPARISON:  CT abdomen and pelvis 06/25/2018 FINDINGS: Residual contrast material is seen in the stomach and in dilated small bowel. No contrast material demonstrated in the colon, suggesting high-grade obstruction. Residual contrast material also noted in the bladder. Surgical clips in the abdomen. Thoracolumbar scoliosis convex towards the left. Degenerative changes in the spine and hips. IMPRESSION: Contrast material demonstrated in dilated small bowel without colonic contrast, suggesting high-grade obstruction. Electronically Signed   By: Lucienne Capers M.D.   On: 06/26/2018 03:10      Assessment/Plan COPD Possible lobar PNA HTN Hx of L nephrectomy for RCC GERD/PUD Chronic diarrhea Hx of bladder cancer HLD Anxiety/Depression   SBO - CT 11/7: Relatively high-grade partial small bowel obstruction with significant dilatation of small bowel loops although there are distal bowel loops which are not distended - insert NGT ASAP - would recommend allowing 24h to decompress given severe distention and starting SB protocol via NGT tomorrow AM - IVF hydration - mobilize as tolerated - hopefully this will resolve with conservative measures, if not patient may require exploratory surgery which could be extremely difficult with surgical history Hypokalemia - mild, K 3.4, IV replacement per medicine  FEN: NPO, IVF VTE: SCDs ID: IV azithromycin 11/7>>    Brigid Re, [facility] Surgery 06/26/2018, 11:27 AM Pager:  [phone] Consults: [phone] Mon-Fri 7:00 am-4:30 pm Sat-Sun 7:00 am-11:30 am

## 2018-06-26 NOTE — Progress Notes (Signed)
[facility] PROGRESS NOTE    Progress Note  Erica York  YTK:[id number] DOB: [birth year] DOA: 06/21/2018 PCP: Heywood Bene, PA-C     Brief Narrative:   Erica York is an 77 y.o. female past medical history of COPD oxygen dependent, essential hypertension comes in with progressive dyspnea and productive cough.  Assessment/Plan:   COPD exacerbation [facility]): She uses 2 L of oxygen at home she seems to be at baseline as per patient. Cont. oral steroids antibiotics continue inhalers.  Possible lobar pneumonia: Cont oral azithromycin.  Hypokalemia: As she is n.p.o. for small bowel obstruction, will start her on normal saline with potassium supplement. Magnesium was 2.1.  Major depression in complete remission (HCC) Cont meds.  New High grade SBO: She is not passing gas and has not had a bowel movement. N.p.o. we will insert NG tube as she is complaining of nausea and abdominal pain. Abdominal x-ray showed small bowel obstruction with no contrast in the colon. General surgery has been consulted.   DVT prophylaxis: lovenxo Family Communication:none Disposition Plan/Barrier to D/C: Unable to determine. Code Status:     Code Status Orders  (From admission, onward)         Start     Ordered   06/21/18 1911  Do not attempt resuscitation (DNR)  Continuous    Question Answer Comment  In the event of cardiac or respiratory ARREST Do not call a "code blue"   In the event of cardiac or respiratory ARREST Do not perform Intubation, CPR, defibrillation or ACLS   In the event of cardiac or respiratory ARREST Use medication by any route, position, wound care, and other measures to relive pain and suffering. May use oxygen, suction and manual treatment of airway obstruction as needed for comfort.      06/21/18 1917        Code Status History    Date Active Date Inactive Code Status Order ID Comments User Context   06/08/2018 1326 06/08/2018 2159 DNR [id number]   Jimmy Footman, NP Inpatient   06/07/2018 0018 06/08/2018 1326 Full Code [id number]  Jani Gravel, MD ED   04/23/2018 0017 04/25/2018 2028 Full Code [id number]  Etta Quill, DO ED   10/07/2017 0336 10/12/2017 1538 Full Code [id number]  Bethena Roys, MD Inpatient   08/01/2017 1349 08/08/2017 1417 Full Code [id number]  Erick Colace, NP ED    Advance Directive Documentation     Most Recent Value  Type of Advance Directive  Healthcare Power of Attorney, Living will  Pre-existing out of facility DNR order (yellow form or pink MOST form)  -  "MOST" Form in Place?  -        IV Access:    Peripheral IV   Procedures and diagnostic studies:   Ct Abdomen Pelvis W Contrast  Result Date: 06/25/2018 CLINICAL DATA:  Suprapubic abdominal pain, acute onset CT abdomen pelvis of 06/06/2018 . EXAM: CT ABDOMEN AND PELVIS WITH CONTRAST TECHNIQUE: Multidetector CT imaging of the abdomen and pelvis was performed using the standard protocol following bolus administration of intravenous contrast. CONTRAST:  44mL OMNIPAQUE IOHEXOL 300 MG/ML SOLN, 2mL OMNIPAQUE IOHEXOL 300 MG/ML SOLN COMPARISON:  None. FINDINGS: Lower chest: Chronic emphysematous changes are present at the lung bases as well as linear scarring within the lingula and right lower lobe with atelectasis as well. No pneumonia or pleural effusion is seen. The heart is mildly enlarged. No pericardial effusion is seen. A single calcified granuloma is present at the right  lung base just above the hemidiaphragm. Hepatobiliary: The liver enhances with no focal abnormality. Surgical clips are present from prior cholecystectomy and the common bile duct is very prominent as noted previously. At the level of the ampulla of the common bile duct measures 12 mm. Pancreas: The pancreas is normal in size and the pancreatic duct is not dilated. The pancreas appears relatively atrophic Spleen: The spleen is unremarkable. A splenic cyst or hemangioma is  noted medially. Adrenals/Urinary Tract: The adrenal glands appear normal. 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 The right kidney appears compensatory hypertrophied. On delayed images, the pelvocaliceal systems are unremarkable. Small renal cysts are noted. No definite solid right renal lesion is seen. The right ureter appears normal in caliber. The urinary bladder is relatively decompressed and difficult to evaluate. Stomach/Bowel: The stomach is filled with oral contrast with no abnormality evident. There are significantly dilated loops of small bowel in this patient was undergone prior colectomy with only the rectum remaining. The exact point of obstruction partial obstruction is difficult to determine. There are however nondilated loops of small bowel distally gamma and the obstruction most likely is due to and adhesion. No definite edema of small bowel or pneumatosis is seen currently. There are differential air-fluid levels present within the dilated fluid-filled small bowel loops. Some fecalization within dilated distal small bowel loops is noted. No free fluid is noted within the pelvis. Vascular/Lymphatic: The abdominal aorta is normal in caliber with significant abdominal aortic atherosclerosis noted. No adenopathy is seen. Reproductive: The uterus has previously been resected. No adnexal lesion is seen. No fluid is noted within the pelvis. Other: No abdominal wall hernia is seen. Musculoskeletal: The compression deformity of L4 vertebral body is stable. Lumbar curvature convex to the left is unchanged as well. IMPRESSION: 1. Relatively high-grade partial small bowel obstruction with significant dilatation of small bowel loops although there are distal bowel loops which are not distended. The exact point of obstruction is difficult to determine but most likely is due to an adhesion. No present edema of small bowel is seen and no pneumatosis is evident. 2. 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 3. This report will be called to the referring physician by radiology assistant. Electronically Signed   By: Ivar Drape M.D.   On: 06/25/2018 17:00   Dg Abd Portable 1v-small Bowel Obstruction Protocol-initial, 8 Hr Delay  Result Date: 06/26/2018 CLINICAL DATA:  8 hour post contrast small-bowel protocol. EXAM: PORTABLE ABDOMEN - 1 VIEW COMPARISON:  CT abdomen and pelvis 06/25/2018 FINDINGS: Residual contrast material is seen in the stomach and in dilated small bowel. No contrast material demonstrated in the colon, suggesting high-grade obstruction. Residual contrast material also noted in the bladder. Surgical clips in the abdomen. Thoracolumbar scoliosis convex towards the left. Degenerative changes in the spine and hips. IMPRESSION: Contrast material demonstrated in dilated small bowel without colonic contrast, suggesting high-grade obstruction. Electronically Signed   By: Lucienne Capers M.D.   On: 06/26/2018 03:10     Medical Consultants:    None.  Anti-Infectives:   Azithro  Subjective:    [name] she relates her breathing is better, she is having ongoing abdominal pain, she has not passed gas and had a bowel movement.  Objective:    Vitals:   06/25/18 2113 06/26/18 0537 06/26/18 0703 06/26/18 0841  BP:  (!) 176/124 (!) 166/99   Pulse:  (!) 108 73   Resp:  20    Temp:  97.9 F (  36.6 C)    TempSrc:  Oral    SpO2: 95% (!) 88%  98%  Weight:      Height:        Intake/Output Summary (Last 24 hours) at 06/26/2018 0855 Last data filed at 06/26/2018 0600 Gross per 24 hour  Intake 174.6 ml  Output 500 ml  Net -325.4 ml   Filed Weights   06/21/18 2031  Weight: 41.7 kg    Exam: General exam: In no acute distress. Respiratory system: Good air movement and clear to auscultation. Cardiovascular system: S1 & S2 heard, RRR.  Gastrointestinal system: Abdomen is nondistended, soft and nontender.  Central nervous system: Alert and oriented. No  focal neurological deficits. Extremities: No pedal edema. Skin: No rashes, lesions or ulcers Psychiatry: Judgement and insight appear normal. Mood & affect appropriate.    Data Reviewed:    Labs: Basic Metabolic Panel: Recent Labs  Lab 06/21/18 1542 06/22/18 0522 06/23/18 0547 06/24/18 0552 06/25/18 0634 06/26/18 0553  NA 139 142 142 142  --  139  K 3.5 2.8* 4.0 4.2  --  3.4*  CL 99 103 107 106  --  94*  CO2 29 26 26 28   --  31  GLUCOSE 137* 136* 135* 125*  --  117*  BUN 11 15 24* 31*  --  26*  CREATININE 0.81 0.82 0.77 0.93  --  0.95  CALCIUM 8.9 8.9 9.2 9.1  --  9.4  MG  --  1.9 2.1 2.1 2.1 2.5*   GFR Estimated Creatinine Clearance: 33.7 mL/min (by C-G formula based on SCr of 0.95 mg/dL). Liver Function Tests: Recent Labs  Lab 06/21/18 1542  AST 16  ALT 7  ALKPHOS 106  BILITOT 0.8  PROT 7.4  ALBUMIN 3.5   Recent Labs  Lab 06/21/18 1542  LIPASE 21   No results for input(s): AMMONIA in the last 168 hours. Coagulation profile No results for input(s): INR, PROTIME in the last 168 hours.  CBC: Recent Labs  Lab 06/21/18 1542 06/22/18 0522 06/23/18 0547 06/24/18 0552 06/25/18 1737  WBC 14.8* 11.1* 13.3* 11.0* 12.1*  NEUTROABS 13.5* 10.3* 12.4* 10.0* 10.3*  HGB 10.7* 10.0* 10.1* 10.3* 12.8  HCT 36.2 33.6* 34.7* 36.1 43.8  MCV 87.7 88.0 90.4 90.9 89.2  PLT 250 241 257 237 262   Cardiac Enzymes: No results for input(s): CKTOTAL, CKMB, CKMBINDEX, TROPONINI in the last 168 hours. BNP (last 3 results) No results for input(s): PROBNP in the last 8760 hours. CBG: No results for input(s): GLUCAP in the last 168 hours. D-Dimer: No results for input(s): DDIMER in the last 72 hours. Hgb A1c: No results for input(s): HGBA1C in the last 72 hours. Lipid Profile: No results for input(s): CHOL, HDL, LDLCALC, TRIG, CHOLHDL, LDLDIRECT in the last 72 hours. Thyroid function studies: No results for input(s): TSH, T4TOTAL, T3FREE, THYROIDAB in the last 72  hours.  Invalid input(s): FREET3 Anemia work up: No results for input(s): VITAMINB12, FOLATE, FERRITIN, TIBC, IRON, RETICCTPCT in the last 72 hours. Sepsis Labs: Recent Labs  Lab 06/21/18 1548 06/21/18 1748 06/22/18 0522 06/23/18 0547 06/24/18 0552 06/25/18 1737  WBC  --   --  11.1* 13.3* 11.0* 12.1*  LATICACIDVEN 1.06 1.96*  --   --   --   --    Microbiology Recent Results (from the past 240 hour(s))  Culture, blood (routine x 2)     Status: None (Preliminary result)   Collection Time: 06/21/18  5:47 PM  Result Value Ref  Range Status   Specimen Description   Final    BLOOD RIGHT ANTECUBITAL Performed at [facility] [address], [place], [place] [address]    Special Requests   Final    BOTTLES DRAWN AEROBIC AND ANAEROBIC Blood Culture adequate volume Performed at [facility] [address], [place], [place] [address]    Culture   Final    NO GROWTH 4 DAYS Performed at [facility], [place] [address], [place], [place] [address]    Report Status PENDING  Incomplete  Culture, blood (routine x 2)     Status: None (Preliminary result)   Collection Time: 06/21/18  5:51 PM  Result Value Ref Range Status   Specimen Description   Final    BLOOD RIGHT WRIST Performed at [facility] [address] [place], [place] [address]    Special Requests   Final    BOTTLES DRAWN AEROBIC AND ANAEROBIC Blood Culture adequate volume Performed at [facility] [address], [place], [place] [address]    Culture   Final    NO GROWTH 4 DAYS Performed at [facility], [place] [address] [place], [place] [address]    Report Status PENDING  Incomplete     Medications:   . enoxaparin (LOVENOX) injection  30 mg Subcutaneous Q24H  . ipratropium-albuterol  3 mL Nebulization TID  . methylPREDNISolone (SOLU-MEDROL) injection  40 mg Intravenous Q12H  . potassium chloride  40 mEq Oral BID  . rOPINIRole   2 mg Oral QHS   Continuous Infusions: . azithromycin 500 mg (06/25/18 1822)      LOS: 5 days   Charlynne Cousins  [facility] Pager [phone]  *Please refer to [email or web address], password [email or web address] to get updated schedule on who will round on this patient, as hospitalists switch teams weekly. If 7PM-7AM, please contact night-coverage at [email or web address], password [email or web address] for any overnight needs.  06/26/2018, 8:55 AM

## 2018-06-26 NOTE — Care Management Important Message (Signed)
Important Message  Patient Details  Name: Erica York MRN: [id number] Date of Birth: [birth year]   Medicare Important Message Given:  Yes    Kerin Salen 06/26/2018, 10:23 AMImportant Message  Patient Details  Name: Erica York MRN: [id number] Date of Birth: 1941-08-03   Medicare Important Message Given:  Yes    Kerin Salen 06/26/2018, 10:23 AM

## 2018-06-26 NOTE — Progress Notes (Signed)
PT Cancellation Note  Patient Details Name: Erica York MRN: [id number] DOB: Jun 16, 1941   Cancelled Treatment:    Reason Eval/Treat Not Completed: Medical issues which prohibited therapy Pt reports unsuccessful placement of NG tube and declines mobility at this time.  Also to go for NG tube placement shortly per RN.   Ronnie Mallette,KATHrine E 06/26/2018, 11:57 AM Carmelia Bake, PT, DPT Acute Rehabilitation Services Office: [phone] Pager: [phone]

## 2018-06-26 NOTE — Progress Notes (Signed)
Nutrition Follow-up  DOCUMENTATION CODES:   Non-severe (moderate) malnutrition in context of chronic illness  INTERVENTION:  - Will monitor for nutrition-related needs at follow-up.    NUTRITION DIAGNOSIS:   Moderate Malnutrition related to chronic illness(COPD) as evidenced by moderate muscle depletion, moderate fat depletion. -ongoing  GOAL:   Patient will meet greater than or equal to 90% of their needs -unmet  MONITOR:   PO intake, Supplement acceptance, Weight trends, Labs  ASSESSMENT:   77 y.o. female with hx of COPD on home O2, depression, HTN, RCC, and RLS. She presented with progressive dyspnea and productive cough x3-4 days. She reports that she could barely breathe with the assistance of her home albuterol nebulizer the night PTA.  No new weight since admission on 11/3. Patient has been eating 25-75% of meals since RD visit on 11/4. Patient reports not being able to eat at all yesterday d/t severity of abdominal pain. She has been experiencing abdominal pain, bloating, constipation, and nausea; she denies any episodes of emesis.  Surgery PA saw patient this AM and note states that CT from yesterday indicates patient with SBO. Plan for NGT and hopeful that it will resolve with conservative measures.   Medications reviewed; 40 mg Solu-medrol BID.  Labs reviewed; Na: 134 mmol/L, Cl: 94 mmol/L, BUN: 26 mg/dL, GFR: 57 mL/min.  IVF; 1/2 NS-40 mEq IV KCl @ 200 mL/hr.     Diet Order:   Diet Order            Diet NPO time specified  Diet effective now              EDUCATION NEEDS:   No education needs have been identified at this time  Skin:  Skin Assessment: Reviewed RN Assessment  Last BM:  11/6  Height:   Ht Readings from Last 1 Encounters:  06/21/18 4\' 11"  (1.499 m)    Weight:   Wt Readings from Last 1 Encounters:  06/21/18 41.7 kg    Ideal Body Weight:  42.91 kg  BMI:  Body mass index is 18.57 kg/m.  Estimated Nutritional Needs:   Kcal:   1375-1540 (33-37 kcal/kg)  Protein:  62-75 grams (1.5-1.8 grams/kg)  Fluid:  >/= 1.5 L/day     Jarome Matin, MS, RD, LDN, [facility] Inpatient Clinical Dietitian Pager # [phone] After hours/weekend pager # [phone]

## 2018-06-27 LAB — BASIC METABOLIC PANEL
Anion gap: 7 (ref 5–15)
BUN: 27 mg/dL — AB (ref 8–23)
CALCIUM: 8.2 mg/dL — AB (ref 8.9–10.3)
CHLORIDE: 100 mmol/L (ref 98–111)
CO2: 27 mmol/L (ref 22–32)
Creatinine, Ser: 0.87 mg/dL (ref 0.44–1.00)
GFR calc Af Amer: >60 mL/min (ref >60)
GLUCOSE: 90 mg/dL (ref 70–99)
POTASSIUM: 5.4 mmol/L — AB (ref 3.5–5.1)
SODIUM: 134 mmol/L — AB (ref 135–145)

## 2018-06-27 MED ORDER — IPRATROPIUM-ALBUTEROL 0.5-2.5 (3) MG/3ML IN SOLN
3.0000 mL | Freq: Two times a day (BID) | RESPIRATORY_TRACT | Status: DC
  Administered 2018-06-27 – 2018-06-30 (×6): 3 mL via RESPIRATORY_TRACT
  Filled 2018-06-27 (×7): qty 3

## 2018-06-27 MED ORDER — ORAL CARE MOUTH RINSE
15.0000 mL | Freq: Two times a day (BID) | OROMUCOSAL | Status: DC
  Administered 2018-06-27 (×2): 15 mL via OROMUCOSAL

## 2018-06-27 MED ORDER — METHYLPREDNISOLONE SODIUM SUCC 40 MG IJ SOLR: 20.0000 mg | Freq: Every day | INTRAMUSCULAR | Status: DC

## 2018-06-27 MED ORDER — SODIUM CHLORIDE 0.9 % IV SOLN
INTRAVENOUS | Status: AC
  Administered 2018-06-27 (×2): via INTRAVENOUS

## 2018-06-27 NOTE — Progress Notes (Addendum)
[facility] PROGRESS NOTE    Progress Note  Erica York  CBJ:[id number] DOB: August 22, 1940 DOA: 06/21/2018 PCP: Heywood Bene, PA-C     Brief Narrative:   Erica York is an 77 y.o. female past medical history of COPD oxygen dependent, essential hypertension comes in with progressive dyspnea and productive cough.  Assessment/Plan:   COPD exacerbation [facility]): She uses 2 L of oxygen at home she seems to be at baseline as per patient. Can you taper down IV steroids. Continue antibiotics.  Possible lobar pneumonia: Cont oral azithromycin.  Hypokalemia: Potassium today has been repleted. We will change IV fluids to normal saline.  Basic metabolic panel in the morning.  Major depression in complete remission (HCC) Cont meds.  New High grade SBO: She is passing gas had a bowel movement this morning.  [place] x-rays pending. N.p.o. we will insert NG tube put out more than 1500 cc GI general surgery's assistance.  Further management per surgery.    DVT prophylaxis: lovenxo Family Communication:none Disposition Plan/Barrier to D/C: Unable to determine. Code Status:     Code Status Orders  (From admission, onward)         Start     Ordered   06/21/18 1911  Do not attempt resuscitation (DNR)  Continuous    Question Answer Comment  In the event of cardiac or respiratory ARREST Do not call a "code blue"   In the event of cardiac or respiratory ARREST Do not perform Intubation, CPR, defibrillation or ACLS   In the event of cardiac or respiratory ARREST Use medication by any route, position, wound care, and other measures to relive pain and suffering. May use oxygen, suction and manual treatment of airway obstruction as needed for comfort.      06/21/18 1917        Code Status History    Date Active Date Inactive Code Status Order ID Comments User Context   06/08/2018 1326 06/08/2018 2159 DNR [id number]  Jimmy Footman, NP Inpatient   06/07/2018 0018 06/08/2018 1326 Full Code [id number]  Jani Gravel, MD ED   04/23/2018 0017 04/25/2018 2028 Full Code [id number]  Etta Quill, DO ED   10/07/2017 0336 10/12/2017 1538 Full Code [id number]  Bethena Roys, MD Inpatient   08/01/2017 1349 08/08/2017 1417 Full Code [id number]  Erick Colace, NP ED    Advance Directive Documentation     Most Recent Value  Type of Advance Directive  Healthcare Power of Attorney, Living will  Pre-existing out of facility DNR order (yellow form or pink MOST form)  -  "MOST" Form in Place?  -        IV Access:    Peripheral IV   Procedures and diagnostic studies:   Ct Abdomen Pelvis W Contrast  Result Date: 06/25/2018 CLINICAL DATA:  Suprapubic abdominal pain, acute onset CT abdomen pelvis of 06/06/2018 . EXAM: CT ABDOMEN AND PELVIS WITH CONTRAST TECHNIQUE: Multidetector CT imaging of the abdomen and pelvis was performed using the standard protocol following bolus administration of intravenous contrast. CONTRAST:  76mL OMNIPAQUE IOHEXOL 300 MG/ML SOLN, 59mL OMNIPAQUE IOHEXOL 300 MG/ML SOLN COMPARISON:  None. FINDINGS: Lower chest: Chronic emphysematous changes are present at the lung bases as well as linear scarring within the lingula and right lower lobe with atelectasis as well. No pneumonia or pleural effusion is seen. The heart is mildly enlarged. No pericardial effusion is seen. A single calcified granuloma is present at the right lung base just above the hemidiaphragm. Hepatobiliary:  The liver enhances with no focal abnormality. Surgical clips are present from prior cholecystectomy and the common bile duct is very prominent as noted previously. At the level of the ampulla of the common bile duct measures 12 mm. Pancreas: The pancreas is normal in size and the pancreatic duct is not dilated. The pancreas appears relatively atrophic Spleen: The spleen is unremarkable. A splenic cyst or hemangioma is noted medially. Adrenals/Urinary Tract: The  adrenal glands appear normal. 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 The right kidney appears compensatory hypertrophied. On delayed images, the pelvocaliceal systems are unremarkable. Small renal cysts are noted. No definite solid right renal lesion is seen. The right ureter appears normal in caliber. The urinary bladder is relatively decompressed and difficult to evaluate. Stomach/Bowel: The stomach is filled with oral contrast with no abnormality evident. There are significantly dilated loops of small bowel in this patient was undergone prior colectomy with only the rectum remaining. The exact point of obstruction partial obstruction is difficult to determine. There are however nondilated loops of small bowel distally gamma and the obstruction most likely is due to and adhesion. No definite edema of small bowel or pneumatosis is seen currently. There are differential air-fluid levels present within the dilated fluid-filled small bowel loops. Some fecalization within dilated distal small bowel loops is noted. No free fluid is noted within the pelvis. Vascular/Lymphatic: The abdominal aorta is normal in caliber with significant abdominal aortic atherosclerosis noted. No adenopathy is seen. Reproductive: The uterus has previously been resected. No adnexal lesion is seen. No fluid is noted within the pelvis. Other: No abdominal wall hernia is seen. Musculoskeletal: The compression deformity of L4 vertebral body is stable. Lumbar curvature convex to the left is unchanged as well. IMPRESSION: 1. Relatively high-grade partial small bowel obstruction with significant dilatation of small bowel loops although there are distal bowel loops which are not distended. The exact point of obstruction is difficult to determine but most likely is due to an adhesion. No present edema of small bowel is seen and no pneumatosis is evident. 2. Prior left nephrectomy with compensatory hypertrophy of the right kidney. 3. This report will be  called to the referring physician by radiology assistant. Electronically Signed   By: Ivar Drape M.D.   On: 06/25/2018 17:00   Dg Abd Portable 1v  Result Date: 06/26/2018 CLINICAL DATA:  NG tube placement. EXAM: PORTABLE ABDOMEN - 1 VIEW COMPARISON:  One-view abdomen 06/26/2018 FINDINGS: The side port of the NG tube is in the fundus the stomach. The stomach is decompressed. The heart is enlarged. Small bilateral pleural effusions are present. Aortic atherosclerosis is noted. Scoliosis is present. IMPRESSION: The side port of the NG tube is in the fundus of the stomach. Electronically Signed   By: San Morelle M.D.   On: 06/26/2018 15:56   Dg Abd Portable 1v-small Bowel Obstruction Protocol-initial, 8 Hr Delay  Result Date: 06/26/2018 CLINICAL DATA:  8 hour post contrast small-bowel protocol. EXAM: PORTABLE ABDOMEN - 1 VIEW COMPARISON:  CT abdomen and pelvis 06/25/2018 FINDINGS: Residual contrast material is seen in the stomach and in dilated small bowel. No contrast material demonstrated in the colon, suggesting high-grade obstruction. Residual contrast material also noted in the bladder. Surgical clips in the abdomen. Thoracolumbar scoliosis convex towards the left. Degenerative changes in the spine and hips. IMPRESSION: Contrast material demonstrated in dilated small bowel without colonic contrast, suggesting high-grade obstruction. Electronically Signed   By: Lucienne Capers M.D.   On: 06/26/2018 03:10  Dg Naso G Tube Plc W/fl W/rad  Result Date: 06/26/2018 CLINICAL DATA:  Small bowel obstruction. Gastrointestinal decompression. EXAM: NASO G TUBE PLACEMENT WITH FL AND WITH RAD CONTRAST:  None. FLUOROSCOPY TIME:  Fluoroscopy Time:  0 minutes and 0 seconds. Radiation Exposure Index (if provided by the fluoroscopic device): Number of Acquired Spot Images: COMPARISON:  Abdomen and pelvis CT from 06/25/2018 was reviewed. FINDINGS: Prior to starting the procedure, the patient's nostrils were  treated with viscous lidocaine. Lidocaine spray was used to treat the posterior oropharynx. The patient was quite concerned about the procedure and requested that I verbalize to her that I would not continue with the procedure if she told me to stop. I reassured her that I would not continue against her wishes. After careful discussion with the patient and counseling her that nasogastric decompression is necessary therapy and would likely make her feel much better, nasogastric tube placement via the right nostril was attempted. The patient clearly and loudly verbalized that she wanted me to stop, so I did. After further discussion, she agreed to allow placement of an Amplatz wire to facilitate guidance of the NG tube. On initial placement of the Amplatz wire into the posterior nasopharynx, she began to loudly vocalize that she wanted me to stop. IMPRESSION: NG tube could not be placed due to patient request that the procedure be aborted. Electronically Signed   By: Misty Stanley M.D.   On: 06/26/2018 14:07     Medical Consultants:    None.  Anti-Infectives:   Azithro  Subjective:    Erica York her abdominal pain is better, she just does not like the NG tube.  Objective:    Vitals:   06/26/18 2031 06/26/18 2111 06/27/18 0604 06/27/18 0749  BP:  129/81 (!) 145/90   Pulse:  (!) 115 96   Resp:  20 16   Temp:  98.1 F (36.7 C) 97.9 F (36.6 C)   TempSrc:  Axillary Oral   SpO2: 96% 96%  94%  Weight:      Height:        Intake/Output Summary (Last 24 hours) at 06/27/2018 0947 Last data filed at 06/27/2018 0532 Gross per 24 hour  Intake 1990.03 ml  Output 1300 ml  Net 690.03 ml   Filed Weights   06/21/18 2031  Weight: 41.7 kg    Exam: General exam: In no acute distress. Respiratory system: Good air movement and clear to auscultation. Cardiovascular system: S1 & S2 heard, RRR.  Gastrointestinal system: Abdomen is nondistended, soft and nontender.  Central nervous system:  Alert and oriented. No focal neurological deficits. Extremities: No pedal edema. Skin: No rashes, lesions or ulcers Psychiatry: Judgement and insight appear normal. Mood & affect appropriate.    Data Reviewed:    Labs: Basic Metabolic Panel: Recent Labs  Lab 06/22/18 0522 06/23/18 0547 06/24/18 0552 06/25/18 0634 06/26/18 0553 06/27/18 0451  NA 142 142 142  --  139 134*  K 2.8* 4.0 4.2  --  3.4* 5.4*  CL 103 107 106  --  94* 100  CO2 26 26 28   --  31 27  GLUCOSE 136* 135* 125*  --  117* 90  BUN 15 24* 31*  --  26* 27*  CREATININE 0.82 0.77 0.93  --  0.95 0.87  CALCIUM 8.9 9.2 9.1  --  9.4 8.2*  MG 1.9 2.1 2.1 2.1 2.5*  --    GFR Estimated Creatinine Clearance: 36.8 mL/min (by C-G formula based on SCr  of 0.87 mg/dL). Liver Function Tests: Recent Labs  Lab 06/21/18 1542  AST 16  ALT 7  ALKPHOS 106  BILITOT 0.8  PROT 7.4  ALBUMIN 3.5   Recent Labs  Lab 06/21/18 1542  LIPASE 21   No results for input(s): AMMONIA in the last 168 hours. Coagulation profile No results for input(s): INR, PROTIME in the last 168 hours.  CBC: Recent Labs  Lab 06/21/18 1542 06/22/18 0522 06/23/18 0547 06/24/18 0552 06/25/18 1737  WBC 14.8* 11.1* 13.3* 11.0* 12.1*  NEUTROABS 13.5* 10.3* 12.4* 10.0* 10.3*  HGB 10.7* 10.0* 10.1* 10.3* 12.8  HCT 36.2 33.6* 34.7* 36.1 43.8  MCV 87.7 88.0 90.4 90.9 89.2  PLT 250 241 257 237 262   Cardiac Enzymes: No results for input(s): CKTOTAL, CKMB, CKMBINDEX, TROPONINI in the last 168 hours. BNP (last 3 results) No results for input(s): PROBNP in the last 8760 hours. CBG: No results for input(s): GLUCAP in the last 168 hours. D-Dimer: No results for input(s): DDIMER in the last 72 hours. Hgb A1c: No results for input(s): HGBA1C in the last 72 hours. Lipid Profile: No results for input(s): CHOL, HDL, LDLCALC, TRIG, CHOLHDL, LDLDIRECT in the last 72 hours. Thyroid function studies: No results for input(s): TSH, T4TOTAL, T3FREE,  THYROIDAB in the last 72 hours.  Invalid input(s): FREET3 Anemia work up: No results for input(s): VITAMINB12, FOLATE, FERRITIN, TIBC, IRON, RETICCTPCT in the last 72 hours. Sepsis Labs: Recent Labs  Lab 06/21/18 1548 06/21/18 1748 06/22/18 0522 06/23/18 0547 06/24/18 0552 06/25/18 1737  WBC  --   --  11.1* 13.3* 11.0* 12.1*  LATICACIDVEN 1.06 1.96*  --   --   --   --    Microbiology Recent Results (from the past 240 hour(s))  Culture, blood (routine x 2)     Status: None   Collection Time: 06/21/18  5:47 PM  Result Value Ref Range Status   Specimen Description   Final    BLOOD RIGHT ANTECUBITAL Performed at [facility], [place] [address] [place], [place] [address]    Special Requests   Final    BOTTLES DRAWN AEROBIC AND ANAEROBIC Blood Culture adequate volume Performed at [place] [address], [place], [place] [address]    Culture   Final    NO GROWTH 5 DAYS Performed at [facility], [place] [address] [place], [place] [address]    Report Status 06/26/2018 FINAL  Final  Culture, blood (routine x 2)     Status: None   Collection Time: 06/21/18  5:51 PM  Result Value Ref Range Status   Specimen Description   Final    BLOOD RIGHT WRIST Performed at [place] [address] [place], [place] [address]    Special Requests   Final    BOTTLES DRAWN AEROBIC AND ANAEROBIC Blood Culture adequate volume Performed at [facility] [address], [place], [place] [address]    Culture   Final    NO GROWTH 5 DAYS Performed at [facility], [place] [address] [place], [place] [address]    Report Status 06/26/2018 FINAL  Final     Medications:   . enoxaparin (LOVENOX) injection  30 mg Subcutaneous Q24H  . ipratropium-albuterol  3 mL Nebulization TID  . mouth rinse  15 mL Mouth Rinse BID  . methylPREDNISolone (SOLU-MEDROL) injection  40 mg Intravenous Q12H   Continuous  Infusions: . sodium chloride 75 mL/hr at 06/27/18 0645  .  azithromycin Stopped (06/27/18 0003)      LOS: 6 days   Charlynne Cousins  [facility] Pager [phone]  *Please refer to [email or web address], password [email or web address] to get updated schedule on who will round on this patient, as hospitalists switch teams weekly. If 7PM-7AM, please contact night-coverage at [email or web address], password [email or web address] for any overnight needs.  06/27/2018, 9:47 AM

## 2018-06-27 NOTE — Progress Notes (Signed)
Patient is alert and oriented x4.  Patient is able to explain to RN the importance of keeping NG tube in nose and stated she would not pull it out.  RN removed soft wrist restraints.  Will continue to monitor pt closely and frequently.

## 2018-06-27 NOTE — Progress Notes (Addendum)
Subjective/Chief Complaint: nausea 1 BM so far   Pain in abdomen about the same  crampy / intermittent   Objective: Vital signs in last 24 hours: Temp:  [97.9 F (36.6 C)-98.2 F (36.8 C)] 97.9 F (36.6 C) (11/09 0604) Pulse Rate:  [96-128] 96 (11/09 0604) Resp:  [16-20] 16 (11/09 0604) BP: (111-174)/(74-110) 145/90 (11/09 0604) SpO2:  [94 %-97 %] 94 % (11/09 0749) Last BM Date: 06/26/18  Intake/Output from previous day: 11/08 0701 - 11/09 0700 In: 1990 [I.V.:1550.3; IV Piggyback:439.7] Out: 1300 [Urine:200; Emesis/NG output:1100] Intake/Output this shift: No intake/output data recorded.  General appearance: alert and cooperative Resp: clear to auscultation bilaterally Cardio: regular rate and rhythm, S1, S2 normal, no murmur, click, rub or gallop GI: ND but diffudely tender  No rebound/ guarding BS present   Lab Results:  Recent Labs    06/25/18 1737  WBC 12.1*  HGB 12.8  HCT 43.8  PLT 262   BMET Recent Labs    06/26/18 0553 06/27/18 0451  NA 139 134*  K 3.4* 5.4*  CL 94* 100  CO2 31 27  GLUCOSE 117* 90  BUN 26* 27*  CREATININE 0.95 0.87  CALCIUM 9.4 8.2*   PT/INR No results for input(s): LABPROT, INR in the last 72 hours. ABG No results for input(s): PHART, HCO3 in the last 72 hours.  Invalid input(s): PCO2, PO2  Studies/Results: Ct Abdomen Pelvis W Contrast  Result Date: 06/25/2018 CLINICAL DATA:  Suprapubic abdominal pain, acute onset CT abdomen pelvis of 06/06/2018 . EXAM: CT ABDOMEN AND PELVIS WITH CONTRAST TECHNIQUE: Multidetector CT imaging of the abdomen and pelvis was performed using the standard protocol following bolus administration of intravenous contrast. CONTRAST:  35mL OMNIPAQUE IOHEXOL 300 MG/ML SOLN, 64mL OMNIPAQUE IOHEXOL 300 MG/ML SOLN COMPARISON:  None. FINDINGS: Lower chest: Chronic emphysematous changes are present at the lung bases as well as linear scarring within the lingula and right lower lobe with atelectasis as well.  No pneumonia or pleural effusion is seen. The heart is mildly enlarged. No pericardial effusion is seen. A single calcified granuloma is present at the right lung base just above the hemidiaphragm. Hepatobiliary: The liver enhances with no focal abnormality. Surgical clips are present from prior cholecystectomy and the common bile duct is very prominent as noted previously. At the level of the ampulla of the common bile duct measures 12 mm. Pancreas: The pancreas is normal in size and the pancreatic duct is not dilated. The pancreas appears relatively atrophic Spleen: The spleen is unremarkable. A splenic cyst or hemangioma is noted medially. Adrenals/Urinary Tract: The adrenal glands appear normal. 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 The right kidney appears compensatory hypertrophied. On delayed images, the pelvocaliceal systems are unremarkable. Small renal cysts are noted. No definite solid right renal lesion is seen. The right ureter appears normal in caliber. The urinary bladder is relatively decompressed and difficult to evaluate. Stomach/Bowel: The stomach is filled with oral contrast with no abnormality evident. There are significantly dilated loops of small bowel in this patient was undergone prior colectomy with only the rectum remaining. The exact point of obstruction partial obstruction is difficult to determine. There are however nondilated loops of small bowel distally gamma and the obstruction most likely is due to and adhesion. No definite edema of small bowel or pneumatosis is seen currently. There are differential air-fluid levels present within the dilated fluid-filled small bowel loops. Some fecalization within dilated distal small bowel loops is noted. No free fluid is noted within the  pelvis. Vascular/Lymphatic: The abdominal aorta is normal in caliber with significant abdominal aortic atherosclerosis noted. No adenopathy is seen. Reproductive: The uterus has previously been resected. No  adnexal lesion is seen. No fluid is noted within the pelvis. Other: No abdominal wall hernia is seen. Musculoskeletal: The compression deformity of L4 vertebral body is stable. Lumbar curvature convex to the left is unchanged as well. IMPRESSION: 1. Relatively high-grade partial small bowel obstruction with significant dilatation of small bowel loops although there are distal bowel loops which are not distended. The exact point of obstruction is difficult to determine but most likely is due to an adhesion. No present edema of small bowel is seen and no pneumatosis is evident. 2. Prior left nephrectomy with compensatory hypertrophy of the right kidney. 3. This report will be called to the referring physician by radiology assistant. Electronically Signed   By: Ivar Drape M.D.   On: 06/25/2018 17:00   Dg Abd Portable 1v  Result Date: 06/26/2018 CLINICAL DATA:  NG tube placement. EXAM: PORTABLE ABDOMEN - 1 VIEW COMPARISON:  One-view abdomen 06/26/2018 FINDINGS: The side port of the NG tube is in the fundus the stomach. The stomach is decompressed. The heart is enlarged. Small bilateral pleural effusions are present. Aortic atherosclerosis is noted. Scoliosis is present. IMPRESSION: The side port of the NG tube is in the fundus of the stomach. Electronically Signed   By: San Morelle M.D.   On: 06/26/2018 15:56   Dg Abd Portable 1v-small Bowel Obstruction Protocol-initial, 8 Hr Delay  Result Date: 06/26/2018 CLINICAL DATA:  8 hour post contrast small-bowel protocol. EXAM: PORTABLE ABDOMEN - 1 VIEW COMPARISON:  CT abdomen and pelvis 06/25/2018 FINDINGS: Residual contrast material is seen in the stomach and in dilated small bowel. No contrast material demonstrated in the colon, suggesting high-grade obstruction. Residual contrast material also noted in the bladder. Surgical clips in the abdomen. Thoracolumbar scoliosis convex towards the left. Degenerative changes in the spine and hips. IMPRESSION:  Contrast material demonstrated in dilated small bowel without colonic contrast, suggesting high-grade obstruction. Electronically Signed   By: Lucienne Capers M.D.   On: 06/26/2018 03:10   Dg Addison Bailey G Tube Plc W/fl W/rad  Result Date: 06/26/2018 CLINICAL DATA:  Small bowel obstruction. Gastrointestinal decompression. EXAM: NASO G TUBE PLACEMENT WITH FL AND WITH RAD CONTRAST:  None. FLUOROSCOPY TIME:  Fluoroscopy Time:  0 minutes and 0 seconds. Radiation Exposure Index (if provided by the fluoroscopic device): Number of Acquired Spot Images: COMPARISON:  Abdomen and pelvis CT from 06/25/2018 was reviewed. FINDINGS: Prior to starting the procedure, the patient's nostrils were treated with viscous lidocaine. Lidocaine spray was used to treat the posterior oropharynx. The patient was quite concerned about the procedure and requested that I verbalize to her that I would not continue with the procedure if she told me to stop. I reassured her that I would not continue against her wishes. After careful discussion with the patient and counseling her that nasogastric decompression is necessary therapy and would likely make her feel much better, nasogastric tube placement via the right nostril was attempted. The patient clearly and loudly verbalized that she wanted me to stop, so I did. After further discussion, she agreed to allow placement of an Amplatz wire to facilitate guidance of the NG tube. On initial placement of the Amplatz wire into the posterior nasopharynx, she began to loudly vocalize that she wanted me to stop. IMPRESSION: NG tube could not be placed due to patient request that the  procedure be aborted. Electronically Signed   By: Misty Stanley M.D.   On: 06/26/2018 14:07    Anti-infectives: Anti-infectives (From admission, onward)   Start     Dose/Rate Route Frequency Ordered Stop   06/25/18 1800  azithromycin (ZITHROMAX) 500 mg in sodium chloride 0.9 % 250 mL IVPB     500 mg 250 mL/hr over 60  Minutes Intravenous Every 24 hours 06/25/18 1712     06/24/18 1330  amoxicillin-clavulanate (AUGMENTIN) 875-125 MG per tablet 1 tablet  Status:  Discontinued     1 tablet Oral Every 12 hours 06/24/18 1328 06/24/18 1328   06/22/18 1800  azithromycin (ZITHROMAX) tablet 250 mg     250 mg Oral Daily-1800 06/21/18 1917 06/24/18 1608   06/21/18 2200  cefTRIAXone (ROCEPHIN) 1 g in sodium chloride 0.9 % 100 mL IVPB     1 g 200 mL/hr over 30 Minutes Intravenous Daily at bedtime 06/21/18 1917 06/23/18 0757   06/21/18 1930  azithromycin (ZITHROMAX) 500 mg in sodium chloride 0.9 % 250 mL IVPB     500 mg 250 mL/hr over 60 Minutes Intravenous  Once 06/21/18 1917 06/22/18 0025   06/21/18 1630  doxycycline (VIBRA-TABS) tablet 100 mg     100 mg Oral  Once 06/21/18 1622 06/21/18 1659      Assessment/Plan: COPD Possible lobar PNA HTN Hx of L nephrectomy for RCC GERD/PUD Chronic diarrhea Hx of bladder cancer HLD Anxiety/Depression   SBO less NGT output and 1 BM  - CT 11/7: Relatively high-grade partial small bowel obstruction with significant dilatation of small bowel loops although there are distal bowel loops which are not distended -  NGT -  SB protocol - IVF hydration - mobilize as tolerated - hopefully this will resolve with conservative measures, if not patient may require exploratory surgery which could be extremely difficult with surgical history   FEN: NPO, IVF VTE: SCDs ID: IV azithromycin 11/7>>   LOS: 6 days    Marcello Moores A Demarus Latterell 06/27/2018

## 2018-06-28 ENCOUNTER — Inpatient Hospital Stay (HOSPITAL_COMMUNITY): Payer: Medicare Other

## 2018-06-28 LAB — BASIC METABOLIC PANEL
Anion gap: 8 (ref 5–15)
BUN: 22 mg/dL (ref 8–23)
CALCIUM: 8.2 mg/dL — AB (ref 8.9–10.3)
CHLORIDE: 98 mmol/L (ref 98–111)
CO2: 29 mmol/L (ref 22–32)
CREATININE: 0.76 mg/dL (ref 0.44–1.00)
GFR calc Af Amer: >60 mL/min (ref >60)
GFR calc non Af Amer: >60 mL/min (ref >60)
Glucose, Bld: 74 mg/dL (ref 70–99)
Potassium: 3.6 mmol/L (ref 3.5–5.1)
SODIUM: 135 mmol/L (ref 135–145)

## 2018-06-28 MED ORDER — SODIUM CHLORIDE 0.9 % IV SOLN
INTRAVENOUS | Status: DC
  Administered 2018-06-28: 20:00:00 via INTRAVENOUS

## 2018-06-28 NOTE — Progress Notes (Signed)
[facility] PROGRESS NOTE    Progress Note  Erica [place]  UXN:[id number] DOB: 1941/06/09 DOA: 06/21/2018 PCP: Heywood Bene, PA-C     Brief Narrative:   Erica [place] is an 77 y.o. female past medical history of COPD oxygen dependent, essential hypertension comes in with progressive dyspnea and productive cough.  Assessment/Plan:   COPD exacerbation [facility]): She uses 2 L of oxygen at home she seems to be at baseline as per patient. Has completed treatment of IV steroids and antibiotics  Possible lobar pneumonia: Completed course.  Hypokalemia: Potassium today has been repleted. We will change IV fluids to normal saline.   Resolved.   Major depression in complete remission (HCC) Cont meds.  New High grade SBO: Has had 2 BM in 24hrs.  Abdominal x-ray seems to be improved. N.p.o. we will insert NG tube put out more than 1500 cc Further management per surgery.    DVT prophylaxis: lovenxo Family Communication:none Disposition Plan/Barrier to D/C: Unable to determine. Code Status:     Code Status Orders  (From admission, onward)         Start     Ordered   06/21/18 1911  Do not attempt resuscitation (DNR)  Continuous    Question Answer Comment  In the event of cardiac or respiratory ARREST Do not call a "code blue"   In the event of cardiac or respiratory ARREST Do not perform Intubation, CPR, defibrillation or ACLS   In the event of cardiac or respiratory ARREST Use medication by any route, position, wound care, and other measures to relive pain and suffering. May use oxygen, suction and manual treatment of airway obstruction as needed for comfort.      06/21/18 1917        Code Status History    Date Active Date Inactive Code Status Order ID Comments User Context   06/08/2018 1326 06/08/2018 2159 DNR [id number]  Jimmy Footman, NP Inpatient   06/07/2018 0018 06/08/2018 1326 Full Code [id number]  Jani Gravel, MD ED   04/23/2018 0017  04/25/2018 2028 Full Code [id number]  Etta Quill, DO ED   10/07/2017 0336 10/12/2017 1538 Full Code [id number]  Bethena Roys, MD Inpatient   08/01/2017 1349 08/08/2017 1417 Full Code [id number]  Erick Colace, NP ED    Advance Directive Documentation     Most Recent Value  Type of Advance Directive  Healthcare Power of Attorney, Living will  Pre-existing out of facility DNR order (yellow form or pink MOST form)  -  "MOST" Form in Place?  -        IV Access:    Peripheral IV   Procedures and diagnostic studies:   Dg Abd Portable 1v  Result Date: 06/26/2018 CLINICAL DATA:  NG tube placement. EXAM: PORTABLE ABDOMEN - 1 VIEW COMPARISON:  One-view abdomen 06/26/2018 FINDINGS: The side port of the NG tube is in the fundus the stomach. The stomach is decompressed. The heart is enlarged. Small bilateral pleural effusions are present. Aortic atherosclerosis is noted. Scoliosis is present. IMPRESSION: The side port of the NG tube is in the fundus of the stomach. Electronically Signed   By: San Morelle M.D.   On: 06/26/2018 15:56   Dg Addison Bailey G Tube Plc W/fl W/rad  Result Date: 06/26/2018 CLINICAL DATA:  Small bowel obstruction. Gastrointestinal decompression. EXAM: NASO G TUBE PLACEMENT WITH FL AND WITH RAD CONTRAST:  None. FLUOROSCOPY TIME:  Fluoroscopy Time:  0 minutes and 0 seconds. Radiation Exposure Index (if  provided by the fluoroscopic device): Number of Acquired Spot Images: COMPARISON:  Abdomen and pelvis CT from 06/25/2018 was reviewed. FINDINGS: Prior to starting the procedure, the patient's nostrils were treated with viscous lidocaine. Lidocaine spray was used to treat the posterior oropharynx. The patient was quite concerned about the procedure and requested that I verbalize to her that I would not continue with the procedure if she told me to stop. I reassured her that I would not continue against her wishes. After careful discussion with the patient and counseling  her that nasogastric decompression is necessary therapy and would likely make her feel much better, nasogastric tube placement via the right nostril was attempted. The patient clearly and loudly verbalized that she wanted me to stop, so I did. After further discussion, she agreed to allow placement of an Amplatz wire to facilitate guidance of the NG tube. On initial placement of the Amplatz wire into the posterior nasopharynx, she began to loudly vocalize that she wanted me to stop. IMPRESSION: NG tube could not be placed due to patient request that the procedure be aborted. Electronically Signed   By: Misty Stanley M.D.   On: 06/26/2018 14:07     Medical Consultants:    None.  Anti-Infectives:   Azithro  Subjective:    Erica [place] her abdominal pain is better, she just does not like the NG tube.  Objective:    Vitals:   06/27/18 2024 06/27/18 2054 06/27/18 2154 06/28/18 0456  BP:  (!) 176/101 (!) 168/91 (!) 151/83  Pulse:  70  72  Resp:  20  (!) 22  Temp:  97.7 F (36.5 C)  97.7 F (36.5 C)  TempSrc:  Oral  Oral  SpO2: 97% 97%  94%  Weight:      Height:        Intake/Output Summary (Last 24 hours) at 06/28/2018 0851 Last data filed at 06/28/2018 7793 Gross per 24 hour  Intake 1800.2 ml  Output 750 ml  Net 1050.2 ml   Filed Weights   06/21/18 2031  Weight: 41.7 kg    Exam: General exam: In no acute distress. Respiratory system: Good air movement and clear to auscultation. Cardiovascular system: S1 & S2 heard, RRR.  Gastrointestinal system: Abdomen is nondistended, soft and nontender.  Central nervous system: Alert and oriented. No focal neurological deficits. Extremities: No pedal edema. Skin: No rashes, lesions or ulcers Psychiatry: Judgement and insight appear normal. Mood & affect appropriate.    Data Reviewed:    Labs: Basic Metabolic Panel: Recent Labs  Lab 06/22/18 0522 06/23/18 0547 06/24/18 0552 06/25/18 0634 06/26/18 0553  06/27/18 0451 06/28/18 0512  NA 142 142 142  --  139 134* 135  K 2.8* 4.0 4.2  --  3.4* 5.4* 3.6  CL 103 107 106  --  94* 100 98  CO2 26 26 28   --  31 27 29   GLUCOSE 136* 135* 125*  --  117* 90 74  BUN 15 24* 31*  --  26* 27* 22  CREATININE 0.82 0.77 0.93  --  0.95 0.87 0.76  CALCIUM 8.9 9.2 9.1  --  9.4 8.2* 8.2*  MG 1.9 2.1 2.1 2.1 2.5*  --   --    GFR Estimated Creatinine Clearance: 40 mL/min (by C-G formula based on SCr of 0.76 mg/dL). Liver Function Tests: Recent Labs  Lab 06/21/18 1542  AST 16  ALT 7  ALKPHOS 106  BILITOT 0.8  PROT 7.4  ALBUMIN 3.5  Recent Labs  Lab 06/21/18 1542  LIPASE 21   No results for input(s): AMMONIA in the last 168 hours. Coagulation profile No results for input(s): INR, PROTIME in the last 168 hours.  CBC: Recent Labs  Lab 06/21/18 1542 06/22/18 0522 06/23/18 0547 06/24/18 0552 06/25/18 1737  WBC 14.8* 11.1* 13.3* 11.0* 12.1*  NEUTROABS 13.5* 10.3* 12.4* 10.0* 10.3*  HGB 10.7* 10.0* 10.1* 10.3* 12.8  HCT 36.2 33.6* 34.7* 36.1 43.8  MCV 87.7 88.0 90.4 90.9 89.2  PLT 250 241 257 237 262   Cardiac Enzymes: No results for input(s): CKTOTAL, CKMB, CKMBINDEX, TROPONINI in the last 168 hours. BNP (last 3 results) No results for input(s): PROBNP in the last 8760 hours. CBG: No results for input(s): GLUCAP in the last 168 hours. D-Dimer: No results for input(s): DDIMER in the last 72 hours. Hgb A1c: No results for input(s): HGBA1C in the last 72 hours. Lipid Profile: No results for input(s): CHOL, HDL, LDLCALC, TRIG, CHOLHDL, LDLDIRECT in the last 72 hours. Thyroid function studies: No results for input(s): TSH, T4TOTAL, T3FREE, THYROIDAB in the last 72 hours.  Invalid input(s): FREET3 Anemia work up: No results for input(s): VITAMINB12, FOLATE, FERRITIN, TIBC, IRON, RETICCTPCT in the last 72 hours. Sepsis Labs: Recent Labs  Lab 06/21/18 1548 06/21/18 1748 06/22/18 0522 06/23/18 0547 06/24/18 0552 06/25/18 1737  WBC   --   --  11.1* 13.3* 11.0* 12.1*  LATICACIDVEN 1.06 1.96*  --   --   --   --    Microbiology Recent Results (from the past 240 hour(s))  Culture, blood (routine x 2)     Status: None   Collection Time: 06/21/18  5:47 PM  Result Value Ref Range Status   Specimen Description   Final    BLOOD RIGHT ANTECUBITAL Performed at [facility], [place] [address], [place], [place] [address]    Special Requests   Final    BOTTLES DRAWN AEROBIC AND ANAEROBIC Blood Culture adequate volume Performed at [place] [address] [place], [place] [address]    Culture   Final    NO GROWTH 5 DAYS Performed at [facility], [place] [address], [place], [place] [address]    Report Status 06/26/2018 FINAL  Final  Culture, blood (routine x 2)     Status: None   Collection Time: 06/21/18  5:51 PM  Result Value Ref Range Status   Specimen Description   Final    BLOOD RIGHT WRIST Performed at [place] [facility] [address] [place], [place] [address]    Special Requests   Final    BOTTLES DRAWN AEROBIC AND ANAEROBIC Blood Culture adequate volume Performed at [place] [address], [place], [place] [address]    Culture   Final    NO GROWTH 5 DAYS Performed at [facility], [place] [address] [place], [place] [address]    Report Status 06/26/2018 FINAL  Final     Medications:   . enoxaparin (LOVENOX) injection  30 mg Subcutaneous Q24H  . ipratropium-albuterol  3 mL Nebulization BID  . mouth rinse  15 mL Mouth Rinse BID  . methylPREDNISolone (SOLU-MEDROL) injection  20 mg Intravenous Daily   Continuous Infusions: . azithromycin 500 mg (06/27/18 2158)      LOS: 7 days   [facility] Pager [phone]  *Please refer to [email or web address], password [email or web address] to get updated schedule on who will round on this  patient, as hospitalists switch teams weekly. If 7PM-7AM, please contact night-coverage  at [email or web address], password [email or web address] for any overnight needs.  06/28/2018, 8:51 AM

## 2018-06-28 NOTE — Progress Notes (Signed)
Subjective/Chief Complaint: sob Abdomen feels better having BM  Minimal out NGT   No abdominal pain    Objective: Vital signs in last 24 hours: Temp:  [97.7 F (36.5 C)-98.6 F (37 C)] 97.7 F (36.5 C) (11/10 0456) Pulse Rate:  [70-85] 72 (11/10 0456) Resp:  [20-22] 22 (11/10 0456) BP: (151-176)/(83-101) 151/83 (11/10 0456) SpO2:  [94 %-97 %] 94 % (11/10 0456) Last BM Date: 06/27/18  Intake/Output from previous day: 11/09 0701 - 11/10 0700 In: 1800.2 [I.V.:1550.2; IV Piggyback:250] Out: 750 [Urine:500; Emesis/NG output:250] Intake/Output this shift: No intake/output data recorded.  General appearance: alert and cooperative Resp: getting breathing treatment  GI: soft ND NT hernia reduced   Lab Results:  Recent Labs    06/25/18 1737  WBC 12.1*  HGB 12.8  HCT 43.8  PLT 262   BMET Recent Labs    06/27/18 0451 06/28/18 0512  NA 134* 135  K 5.4* 3.6  CL 100 98  CO2 27 29  GLUCOSE 90 74  BUN 27* 22  CREATININE 0.87 0.76  CALCIUM 8.2* 8.2*   PT/INR No results for input(s): LABPROT, INR in the last 72 hours. ABG No results for input(s): PHART, HCO3 in the last 72 hours.  Invalid input(s): PCO2, PO2  Studies/Results: Dg Abd Portable 1v  Result Date: 06/26/2018 CLINICAL DATA:  NG tube placement. EXAM: PORTABLE ABDOMEN - 1 VIEW COMPARISON:  One-view abdomen 06/26/2018 FINDINGS: The side port of the NG tube is in the fundus the stomach. The stomach is decompressed. The heart is enlarged. Small bilateral pleural effusions are present. Aortic atherosclerosis is noted. Scoliosis is present. IMPRESSION: The side port of the NG tube is in the fundus of the stomach. Electronically Signed   By: San Morelle M.D.   On: 06/26/2018 15:56   Dg Addison Bailey G Tube Plc W/fl W/rad  Result Date: 06/26/2018 CLINICAL DATA:  Small bowel obstruction. Gastrointestinal decompression. EXAM: NASO G TUBE PLACEMENT WITH FL AND WITH RAD CONTRAST:  None. FLUOROSCOPY TIME:   Fluoroscopy Time:  0 minutes and 0 seconds. Radiation Exposure Index (if provided by the fluoroscopic device): Number of Acquired Spot Images: COMPARISON:  Abdomen and pelvis CT from 06/25/2018 was reviewed. FINDINGS: Prior to starting the procedure, the patient's nostrils were treated with viscous lidocaine. Lidocaine spray was used to treat the posterior oropharynx. The patient was quite concerned about the procedure and requested that I verbalize to her that I would not continue with the procedure if she told me to stop. I reassured her that I would not continue against her wishes. After careful discussion with the patient and counseling her that nasogastric decompression is necessary therapy and would likely make her feel much better, nasogastric tube placement via the right nostril was attempted. The patient clearly and loudly verbalized that she wanted me to stop, so I did. After further discussion, she agreed to allow placement of an Amplatz wire to facilitate guidance of the NG tube. On initial placement of the Amplatz wire into the posterior nasopharynx, she began to loudly vocalize that she wanted me to stop. IMPRESSION: NG tube could not be placed due to patient request that the procedure be aborted. Electronically Signed   By: Misty Stanley M.D.   On: 06/26/2018 14:07    Anti-infectives: Anti-infectives (From admission, onward)   Start     Dose/Rate Route Frequency Ordered Stop   06/25/18 1800  azithromycin (ZITHROMAX) 500 mg in sodium chloride 0.9 % 250 mL IVPB  500 mg 250 mL/hr over 60 Minutes Intravenous Every 24 hours 06/25/18 1712     06/24/18 1330  amoxicillin-clavulanate (AUGMENTIN) 875-125 MG per tablet 1 tablet  Status:  Discontinued     1 tablet Oral Every 12 hours 06/24/18 1328 06/24/18 1328   06/22/18 1800  azithromycin (ZITHROMAX) tablet 250 mg     250 mg Oral Daily-1800 06/21/18 1917 06/24/18 1608   06/21/18 2200  cefTRIAXone (ROCEPHIN) 1 g in sodium chloride 0.9 % 100 mL  IVPB     1 g 200 mL/hr over 30 Minutes Intravenous Daily at bedtime 06/21/18 1917 06/23/18 0757   06/21/18 1930  azithromycin (ZITHROMAX) 500 mg in sodium chloride 0.9 % 250 mL IVPB     500 mg 250 mL/hr over 60 Minutes Intravenous  Once 06/21/18 1917 06/22/18 0025   06/21/18 1630  doxycycline (VIBRA-TABS) tablet 100 mg     100 mg Oral  Once 06/21/18 1622 06/21/18 1659      Assessment/Plan: COPD Possible lobar PNA HTN Hx of L nephrectomy for RCC GERD/PUD Chronic diarrhea Hx of bladder cancer HLD Anxiety/Depression   SBO less NGT output and 1 BM  - CT 11/7:Relatively high-grade partial small bowel obstruction with significant dilatation of small bowel loops although there are distal bowel loops which are not distended -  NGT clamp  - SB protocol - IVF hydration - mobilize as tolerated - improving  Clamp NGT and remove if able  Advance diet once NGT out   FEN: NPO, IVF VTE: SCDs ID: IV azithromycin 11/7>>   LOS: 7 days    Erica [place] 06/28/2018

## 2018-06-28 NOTE — Progress Notes (Signed)
Q4 w/a Flutter was held due to pt. having NG remaining in place due to SBO, does have moderate productive cough.

## 2018-06-28 NOTE — Progress Notes (Signed)
NG tube clamped for 6 hours this morning per order. The patient experienced no nausea or vomiting during this time. Per order, NG tube was removed at approx 1330. She ordered a clear liquid tray and drank a cup of broth, juice, and an [place] ice and is tolerating well without any N/V.

## 2018-06-29 LAB — BASIC METABOLIC PANEL
Anion gap: 6 (ref 5–15)
BUN: 13 mg/dL (ref 8–23)
CHLORIDE: 102 mmol/L (ref 98–111)
CO2: 29 mmol/L (ref 22–32)
CREATININE: 0.68 mg/dL (ref 0.44–1.00)
Calcium: 8.2 mg/dL — ABNORMAL LOW (ref 8.9–10.3)
GFR calc Af Amer: >60 mL/min (ref >60)
GFR calc non Af Amer: >60 mL/min (ref >60)
Glucose, Bld: 88 mg/dL (ref 70–99)
Potassium: 3.6 mmol/L (ref 3.5–5.1)
Sodium: 137 mmol/L (ref 135–145)

## 2018-06-29 MED ORDER — DOCUSATE SODIUM 100 MG PO CAPS
100.0000 mg | ORAL_CAPSULE | Freq: Every day | ORAL | Status: DC
  Administered 2018-06-29 – 2018-06-30 (×2): 100 mg via ORAL
  Filled 2018-06-29 (×2): qty 1

## 2018-06-29 MED ORDER — TRAMADOL HCL 50 MG PO TABS
50.0000 mg | ORAL_TABLET | Freq: Four times a day (QID) | ORAL | Status: DC | PRN
  Administered 2018-06-29 (×2): 50 mg via ORAL
  Filled 2018-06-29 (×2): qty 1

## 2018-06-29 MED ORDER — POLYETHYLENE GLYCOL 3350 17 G PO PACK: 17.0000 g | PACK | Freq: Every day | ORAL | Status: DC | PRN

## 2018-06-29 NOTE — Progress Notes (Addendum)
[facility] Progress Note     Subjective: CC: no complaints Patient feeling better. Tolerating CLD since NGT removal. Passing flatus and having bowel movements. Mild tenderness in upper abdomen.   Objective: Vital signs in last 24 hours: Temp:  [97.8 F (36.6 C)-98.5 F (36.9 C)] 98.5 F (36.9 C) (11/11 0615) Pulse Rate:  [86-91] 86 (11/11 0615) Resp:  [12-16] 16 (11/11 0615) BP: (131-139)/(79-90) 135/90 (11/11 0615) SpO2:  [95 %-97 %] 95 % (11/11 0746) Last BM Date: 06/28/18  Intake/Output from previous day: 11/10 0701 - 11/11 0700 In: 140.9 [I.V.:140.9] Out: 700 [Urine:700] Intake/Output this shift: No intake/output data recorded.  PE: Gen:  Alert, NAD, pleasant Card:  Regular rate and rhythm, pedal pulses 2+ BL Pulm:  Normal effort, clear to auscultation bilaterally, nasal cannula Abd: Soft, mild TTP in upper abdomen, non-distended, bowel sounds present, no HSM Skin: warm and dry, no rashes  Psych: A&Ox3   Lab Results:  No results for input(s): WBC, HGB, HCT, PLT in the last 72 hours. BMET Recent Labs    06/28/18 0512 06/29/18 0533  NA 135 137  K 3.6 3.6  CL 98 102  CO2 29 29  GLUCOSE 74 88  BUN 22 13  CREATININE 0.76 0.68  CALCIUM 8.2* 8.2*   PT/INR No results for input(s): LABPROT, INR in the last 72 hours. CMP     Component Value Date/Time   NA 137 06/29/2018 0533   K 3.6 06/29/2018 0533   CL 102 06/29/2018 0533   CO2 29 06/29/2018 0533   GLUCOSE 88 06/29/2018 0533   BUN 13 06/29/2018 0533   CREATININE 0.68 06/29/2018 0533   CALCIUM 8.2 (L) 06/29/2018 0533   PROT 7.4 06/21/2018 1542   ALBUMIN 3.5 06/21/2018 1542   AST 16 06/21/2018 1542   ALT 7 06/21/2018 1542   ALKPHOS 106 06/21/2018 1542   BILITOT 0.8 06/21/2018 1542   GFRNONAA >60 06/29/2018 0533   GFRAA >60 06/29/2018 0533   Lipase     Component Value Date/Time   LIPASE 21 06/21/2018 1542       Studies/Results: Dg Abd 1 View  Result Date: 06/28/2018 CLINICAL  DATA:  Followup for small bowel obstruction. EXAM: ABDOMEN - 1 VIEW COMPARISON:  06/26/2018.  CT, 06/25/2018. FINDINGS: No significant small bowel dilation. Contrast is seen in the colon in the pelvis. Stable well-positioned nasal/orogastric tube. There are bowel anastomosis staples and surgical vascular clips, stable. IMPRESSION: 1. No radiographic evidence of residual small bowel obstruction. Electronically Signed   By: Lajean Manes M.D.   On: 06/28/2018 08:49    Anti-infectives: Anti-infectives (From admission, onward)   Start     Dose/Rate Route Frequency Ordered Stop   06/25/18 1800  azithromycin (ZITHROMAX) 500 mg in sodium chloride 0.9 % 250 mL IVPB  Status:  Discontinued     500 mg 250 mL/hr over 60 Minutes Intravenous Every 24 hours 06/25/18 1712 06/28/18 0854   06/24/18 1330  amoxicillin-clavulanate (AUGMENTIN) 875-125 MG per tablet 1 tablet  Status:  Discontinued     1 tablet Oral Every 12 hours 06/24/18 1328 06/24/18 1328   06/22/18 1800  azithromycin (ZITHROMAX) tablet 250 mg     250 mg Oral Daily-1800 06/21/18 1917 06/24/18 1608   06/21/18 2200  cefTRIAXone (ROCEPHIN) 1 g in sodium chloride 0.9 % 100 mL IVPB     1 g 200 mL/hr over 30 Minutes Intravenous Daily at bedtime 06/21/18 1917 06/23/18 0757   06/21/18 1930  azithromycin (ZITHROMAX) 500 mg  in sodium chloride 0.9 % 250 mL IVPB     500 mg 250 mL/hr over 60 Minutes Intravenous  Once 06/21/18 1917 06/22/18 0025   06/21/18 1630  doxycycline (VIBRA-TABS) tablet 100 mg     100 mg Oral  Once 06/21/18 1622 06/21/18 1659       Assessment/Plan COPD Possible lobar PNA HTN Hx of L nephrectomy for RCC GERD/PUD Chronic diarrhea Hx of bladder cancer HLD Anxiety/Depression   SBO - CT 11/7:Relatively high-grade partial small bowel obstruction with significant dilatation of small bowel loops although there are distal bowel loops which are not distended - Abdominal film yesterday with no residual sbo and contrast in the  colon - advance to FLD  - mobilize as tolerated  FEN: FLD VTE: SCDs, lovenox ID: IV azithromycin 11/7>11/10  LOS: 8 days    Brigid Re , [facility] 06/29/2018, 9:06 AM Pager: [phone] Consults: [phone] Mon-Fri 7:00 am-4:30 pm Sat-Sun 7:00 am-11:30 am

## 2018-06-29 NOTE — Progress Notes (Signed)
[facility] PROGRESS NOTE    Progress Note  Tana Trefry  XBL:[id number] DOB: 1940-10-20 DOA: 06/21/2018 PCP: Heywood Bene, PA-C     Brief Narrative:   Erica York is an 77 y.o. female past medical history of COPD oxygen dependent, essential hypertension comes in with progressive dyspnea and productive cough.  Assessment/Plan:   COPD exacerbation [facility]): She uses 2 L of oxygen at home she seems to be at baseline as per patient. Has completed treatment of IV steroids and antibiotics  Possible lobar pneumonia: Completed course.  Hypokalemia: Potassium today has been repleted. We will change IV fluids to normal saline.   Resolved.   Major depression in complete remission (HCC) Cont meds.  New High grade SBO: Surgery was consulted recommended conservative management. Her small bowel obstruction seems to be resolved we will advance her diet to soft diet further management per surgery.   DVT prophylaxis: lovenxo Family Communication:none Disposition Plan/Barrier to D/C: Unable to determine. Code Status:     Code Status Orders  (From admission, onward)         Start     Ordered   06/21/18 1911  Do not attempt resuscitation (DNR)  Continuous    Question Answer Comment  In the event of cardiac or respiratory ARREST Do not call a "code blue"   In the event of cardiac or respiratory ARREST Do not perform Intubation, CPR, defibrillation or ACLS   In the event of cardiac or respiratory ARREST Use medication by any route, position, wound care, and other measures to relive pain and suffering. May use oxygen, suction and manual treatment of airway obstruction as needed for comfort.      06/21/18 1917        Code Status History    Date Active Date Inactive Code Status Order ID Comments User Context   06/08/2018 1326 06/08/2018 2159 DNR [id number]  Jimmy Footman, NP Inpatient   06/07/2018 0018 06/08/2018 1326 Full Code [id number]  Jani Gravel,  MD ED   04/23/2018 0017 04/25/2018 2028 Full Code [id number]  Etta Quill, DO ED   10/07/2017 0336 10/12/2017 1538 Full Code [id number]  Bethena Roys, MD Inpatient   08/01/2017 1349 08/08/2017 1417 Full Code [id number]  Erick Colace, NP ED    Advance Directive Documentation     Most Recent Value  Type of Advance Directive  Healthcare Power of Attorney, Living will  Pre-existing out of facility DNR order (yellow form or pink MOST form)  -  "MOST" Form in Place?  -        IV Access:    Peripheral IV   Procedures and diagnostic studies:   Dg Abd 1 View  Result Date: 06/28/2018 CLINICAL DATA:  Followup for small bowel obstruction. EXAM: ABDOMEN - 1 VIEW COMPARISON:  06/26/2018.  CT, 06/25/2018. FINDINGS: No significant small bowel dilation. Contrast is seen in the colon in the pelvis. Stable well-positioned nasal/orogastric tube. There are bowel anastomosis staples and surgical vascular clips, stable. IMPRESSION: 1. No radiographic evidence of residual small bowel obstruction. Electronically Signed   By: Lajean Manes M.D.   On: 06/28/2018 08:49     Medical Consultants:    None.  Anti-Infectives:   Azithro  Subjective:    [name] some abdominal pain intermittently but has had 2 bowel movements in the last 24 hours.  Objective:    Vitals:   06/28/18 2004 06/28/18 2023 06/29/18 0615 06/29/18 0746  BP:  131/87 135/90   Pulse:  91 86   Resp:  12 16   Temp:  98.1 F (36.7 C) 98.5 F (36.9 C)   TempSrc:  Oral Oral   SpO2: 97%  96% 95%  Weight:      Height:        Intake/Output Summary (Last 24 hours) at 06/29/2018 1037 Last data filed at 06/29/2018 1016 Gross per 24 hour  Intake 620.88 ml  Output 700 ml  Net -79.12 ml   Filed Weights   06/21/18 2031  Weight: 41.7 kg    Exam: General exam: In no acute distress. Respiratory system: Good air movement and clear to auscultation. Cardiovascular system: S1 & S2 heard, RRR.  Gastrointestinal  system: Abdomen is nondistended, soft and nontender.  Central nervous system: Alert and oriented. No focal neurological deficits. Extremities: No pedal edema. Skin: No rashes, lesions or ulcers Psychiatry: Judgement and insight appear normal. Mood & affect appropriate.    Data Reviewed:    Labs: Basic Metabolic Panel: Recent Labs  Lab 06/23/18 0547 06/24/18 0552 06/25/18 3295 06/26/18 0553 06/27/18 0451 06/28/18 0512 06/29/18 0533  NA 142 142  --  139 134* 135 137  K 4.0 4.2  --  3.4* 5.4* 3.6 3.6  CL 107 106  --  94* 100 98 102  CO2 26 28  --  31 27 29 29   GLUCOSE 135* 125*  --  117* 90 74 88  BUN 24* 31*  --  26* 27* 22 13  CREATININE 0.77 0.93  --  0.95 0.87 0.76 0.68  CALCIUM 9.2 9.1  --  9.4 8.2* 8.2* 8.2*  MG 2.1 2.1 2.1 2.5*  --   --   --    GFR Estimated Creatinine Clearance: 40 mL/min (by C-G formula based on SCr of 0.68 mg/dL). Liver Function Tests: No results for input(s): AST, ALT, ALKPHOS, BILITOT, PROT, ALBUMIN in the last 168 hours. No results for input(s): LIPASE, AMYLASE in the last 168 hours. No results for input(s): AMMONIA in the last 168 hours. Coagulation profile No results for input(s): INR, PROTIME in the last 168 hours.  CBC: Recent Labs  Lab 06/23/18 0547 06/24/18 0552 06/25/18 1737  WBC 13.3* 11.0* 12.1*  NEUTROABS 12.4* 10.0* 10.3*  HGB 10.1* 10.3* 12.8  HCT 34.7* 36.1 43.8  MCV 90.4 90.9 89.2  PLT 257 237 262   Cardiac Enzymes: No results for input(s): CKTOTAL, CKMB, CKMBINDEX, TROPONINI in the last 168 hours. BNP (last 3 results) No results for input(s): PROBNP in the last 8760 hours. CBG: No results for input(s): GLUCAP in the last 168 hours. D-Dimer: No results for input(s): DDIMER in the last 72 hours. Hgb A1c: No results for input(s): HGBA1C in the last 72 hours. Lipid Profile: No results for input(s): CHOL, HDL, LDLCALC, TRIG, CHOLHDL, LDLDIRECT in the last 72 hours. Thyroid function studies: No results for input(s):  TSH, T4TOTAL, T3FREE, THYROIDAB in the last 72 hours.  Invalid input(s): FREET3 Anemia work up: No results for input(s): VITAMINB12, FOLATE, FERRITIN, TIBC, IRON, RETICCTPCT in the last 72 hours. Sepsis Labs: Recent Labs  Lab 06/23/18 0547 06/24/18 0552 06/25/18 1737  WBC 13.3* 11.0* 12.1*   Microbiology Recent Results (from the past 240 hour(s))  Culture, blood (routine x 2)     Status: None   Collection Time: 06/21/18  5:47 PM  Result Value Ref Range Status   Specimen Description   Final    BLOOD RIGHT ANTECUBITAL Performed at [facility], [place] [place], [place]  [address]    Special Requests   Final    BOTTLES DRAWN AEROBIC AND ANAEROBIC Blood Culture adequate volume Performed at [facility] [address] [place], [place] [address]    Culture   Final    NO GROWTH 5 DAYS Performed at [facility], [place] [address], [place], [place] [address]    Report Status 06/26/2018 FINAL  Final  Culture, blood (routine x 2)     Status: None   Collection Time: 06/21/18  5:51 PM  Result Value Ref Range Status   Specimen Description   Final    BLOOD RIGHT WRIST Performed at [facility] [address] [place], [place] [address]    Special Requests   Final    BOTTLES DRAWN AEROBIC AND ANAEROBIC Blood Culture adequate volume Performed at [facility] [address], [place], [place] [address]    Culture   Final    NO GROWTH 5 DAYS Performed at [facility], [place] [address], [place], [place] [address]    Report Status 06/26/2018 FINAL  Final     Medications:   . docusate sodium  100 mg Oral Daily  . enoxaparin (LOVENOX) injection  30 mg Subcutaneous Q24H  . ipratropium-albuterol  3 mL Nebulization BID  . mouth rinse  15 mL Mouth Rinse BID   Continuous Infusions:     LOS: 8 days   Charlynne Cousins  [facility] Pager [phone]  *Please refer to [email or web address],  password [email or web address] to get updated schedule on who will round on this patient, as hospitalists switch teams weekly. If 7PM-7AM, please contact night-coverage at [email or web address], password [email or web address] for any overnight needs.  06/29/2018, 10:37 AM

## 2018-06-29 NOTE — Progress Notes (Signed)
Physical Therapy Treatment Patient Details Name: Erica York MRN: [id number] DOB: July 02, 1941 Today's Date: 06/29/2018    History of Present Illness 77 y.o. 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 Suggestive of COPD exacerbation although cannot rule out pneumonia based on CXR     PT Comments    Pt is progressing with mobility well, she ambulated 250' pushing O2 tank carrier, no loss of balance. Unable to obtain SaO2 reading 2* poor signal on dynamap and on pt's portable monitor. Pt had no dyspnea while ambulating.    Follow Up Recommendations  Home health PT     Equipment Recommendations  None recommended by PT    Recommendations for Other Services       Precautions / Restrictions Precautions Precautions: Fall;Other (comment) Precaution Comments: monitor O2; pt reports 1 fall about 4 weeks ago related to medication change, denies other falls in past 1 year, uses 2L O2 at home Restrictions Weight Bearing Restrictions: No    Mobility  Bed Mobility Overal bed mobility: Modified Independent             General bed mobility comments: HOB up 25*, used rail  Transfers Overall transfer level: Needs assistance Equipment used: Rolling walker (2 wheeled) Transfers: Sit to/from Stand Sit to Stand: Supervision         General transfer comment: steady, no loss of balance, VCs hand placement  Ambulation/Gait Ambulation/Gait assistance: Supervision Gait Distance (Feet): 250 Feet Assistive device: (pt pushed O2 tank carrier) Gait Pattern/deviations: WFL(Within Functional Limits) Gait velocity: WFL   General Gait Details: ambulated with 2L O2 (which is pt's baseline), steady, no loss of balance, attempted to obtain SaO2/HR reading but no reading came up on dynamap due to weak signal (pt's fingers were very cold) nor on pt's portable monitor   Stairs              Wheelchair Mobility    Modified Rankin (Stroke Patients Only)       Balance Overall balance assessment: Needs assistance Sitting-balance support: Single extremity supported;Feet supported Sitting balance-Leahy Scale: Good     Standing balance support: No upper extremity supported Standing balance-Leahy Scale: Fair                              Cognition Arousal/Alertness: Awake/alert Behavior During Therapy: WFL for tasks assessed/performed Overall Cognitive Status: Within Functional Limits for tasks assessed                                        Exercises      General Comments        Pertinent Vitals/Pain Pain Assessment: No/denies pain    Home Living                      Prior Function            PT Goals (current goals can now be found in the care plan section) Acute Rehab PT Goals Patient Stated Goal: Go home, do laundry, be with her dog PT Goal Formulation: With patient Time For Goal Achievement: 07/06/18 Potential to Achieve Goals: Good Progress towards PT goals: Progressing toward goals    Frequency    Min 3X/week      PT Plan Current plan  remains appropriate    Co-evaluation              AM-PAC PT "6 Clicks" Daily Activity  Outcome Measure  Difficulty turning over in bed (including adjusting bedclothes, sheets and blankets)?: None Difficulty moving from lying on back to sitting on the side of the bed? : None Difficulty sitting down on and standing up from a chair with arms (e.g., wheelchair, bedside commode, etc,.)?: A Little Help needed moving to and from a bed to chair (including a wheelchair)?: None Help needed walking in hospital room?: None Help needed climbing 3-5 steps with a railing? : A Little 6 Click Score: 22    End of Session Equipment Utilized During Treatment: Gait belt;Oxygen Activity Tolerance: Patient tolerated treatment well Patient left: in bed;with call bell/phone  within reach;Other (comment)(sitting EOB with respiratory therapy) Nurse Communication: Mobility status PT Visit Diagnosis: Difficulty in walking, not elsewhere classified (R26.2)     Time: 8184-0375 PT Time Calculation (min) (ACUTE ONLY): 17 min  Charges:  $Gait Training: 8-22 mins                     Blondell Reveal Kistler PT 06/29/2018  Acute Rehabilitation Services Pager [phone] Office [phone]

## 2018-06-30 DIAGNOSIS — J9621 Acute and chronic respiratory failure with hypoxia: Secondary | ICD-10-CM

## 2018-06-30 NOTE — Final Consult Note (Signed)
[facility] Progress Note     Subjective: CC: sbo Patient tolerating soft diet, had a BM yesterday. Passing flatus but no BM yet today. Denies nausea. Mild chronic abdominal pain, patient describes as gas pain.   Objective: Vital signs in last 24 hours: Temp:  [97.8 F (36.6 C)-98.5 F (36.9 C)] 98.5 F (36.9 C) (11/12 0452) Pulse Rate:  [82-103] 93 (11/12 0452) Resp:  [12-18] 12 (11/12 0452) BP: (110-143)/(65-114) 110/65 (11/12 0452) SpO2:  [93 %-97 %] 97 % (11/12 0819) Last BM Date: 06/29/18  Intake/Output from previous day: 11/11 0701 - 11/12 0700 In: 720 [P.O.:720] Out: 300 [Urine:300] Intake/Output this shift: No intake/output data recorded.  PE: Gen:  Alert, NAD, pleasant Card:  Regular rate and rhythm, pedal pulses 2+ BL Pulm:  Normal effort, clear to auscultation bilaterally, nasal cannula Abd: Soft, mild TTP in upper abdomen, non-distended, bowel sounds present, no HSM Skin: warm and dry, no rashes  Psych: A&Ox3   Lab Results:  No results for input(s): WBC, HGB, HCT, PLT in the last 72 hours. BMET Recent Labs    06/28/18 0512 06/29/18 0533  NA 135 137  K 3.6 3.6  CL 98 102  CO2 29 29  GLUCOSE 74 88  BUN 22 13  CREATININE 0.76 0.68  CALCIUM 8.2* 8.2*   PT/INR No results for input(s): LABPROT, INR in the last 72 hours. CMP     Component Value Date/Time   NA 137 06/29/2018 0533   K 3.6 06/29/2018 0533   CL 102 06/29/2018 0533   CO2 29 06/29/2018 0533   GLUCOSE 88 06/29/2018 0533   BUN 13 06/29/2018 0533   CREATININE 0.68 06/29/2018 0533   CALCIUM 8.2 (L) 06/29/2018 0533   PROT 7.4 06/21/2018 1542   ALBUMIN 3.5 06/21/2018 1542   AST 16 06/21/2018 1542   ALT 7 06/21/2018 1542   ALKPHOS 106 06/21/2018 1542   BILITOT 0.8 06/21/2018 1542   GFRNONAA >60 06/29/2018 0533   GFRAA >60 06/29/2018 0533   Lipase     Component Value Date/Time   LIPASE 21 06/21/2018 1542       Studies/Results: No results  found.  Anti-infectives: Anti-infectives (From admission, onward)   Start     Dose/Rate Route Frequency Ordered Stop   06/25/18 1800  azithromycin (ZITHROMAX) 500 mg in sodium chloride 0.9 % 250 mL IVPB  Status:  Discontinued     500 mg 250 mL/hr over 60 Minutes Intravenous Every 24 hours 06/25/18 1712 06/28/18 0854   06/24/18 1330  amoxicillin-clavulanate (AUGMENTIN) 875-125 MG per tablet 1 tablet  Status:  Discontinued     1 tablet Oral Every 12 hours 06/24/18 1328 06/24/18 1328   06/22/18 1800  azithromycin (ZITHROMAX) tablet 250 mg     250 mg Oral Daily-1800 06/21/18 1917 06/24/18 1608   06/21/18 2200  cefTRIAXone (ROCEPHIN) 1 g in sodium chloride 0.9 % 100 mL IVPB     1 g 200 mL/hr over 30 Minutes Intravenous Daily at bedtime 06/21/18 1917 06/23/18 0757   06/21/18 1930  azithromycin (ZITHROMAX) 500 mg in sodium chloride 0.9 % 250 mL IVPB     500 mg 250 mL/hr over 60 Minutes Intravenous  Once 06/21/18 1917 06/22/18 0025   06/21/18 1630  doxycycline (VIBRA-TABS) tablet 100 mg     100 mg Oral  Once 06/21/18 1622 06/21/18 1659       Assessment/Plan COPD Possible lobar PNA HTN Hx of L nephrectomy for RCC GERD/PUD Chronic diarrhea Hx of bladder  cancer HLD Anxiety/Depression   SBO - CT 11/7:Relatively high-grade partial small bowel obstruction with significant dilatation of small bowel loops although there are distal bowel loops which are not distended - tolerating soft diet and having bowel function - no indications for acute surgical intervention, we will sign off. Please call if we can be of further assistance  FEN: soft VTE: SCDs, lovenox ID: IV azithromycin 11/7>11/10  LOS: 9 days    Brigid Re , [facility] 06/30/2018, 9:17 AM Pager: [phone] Consults: [phone] Mon-Fri 7:00 am-4:30 pm Sat-Sun 7:00 am-11:30 am

## 2018-06-30 NOTE — Progress Notes (Signed)
Patient given discharge, follow up, and medication instructions, verbalized understanding, IV and telemetry monitor removed, personal belongings with patient, family to transport home  

## 2018-06-30 NOTE — Discharge Summary (Signed)
Physician Discharge Summary  Erica York MVE:[id number] DOB: Nov 11, 1940 DOA: 06/21/2018  PCP: Heywood Bene, PA-C  Admit date: 06/21/2018 Discharge date: 06/30/2018  Admitted From: home Disposition:  Home  Recommendations for Outpatient Follow-up:  1. Follow up with PCP in 1-2 weeks 2. Please obtain BMP/CBC in one week 3.   Home Health:No Equipment/Devices:None  Discharge Condition:stable CODE STATUS:DNR Diet recommendation: Heart Healthy   Brief/Interim Summary: 77 y.o. female past medical history of COPD oxygen dependent, essential hypertension comes in with progressive dyspnea and productive cough.  Discharge Diagnoses:  Principal Problem:   SBO (small bowel obstruction) (HCC) Active Problems:   Malnutrition of moderate degree   Hypokalemia   COPD exacerbation (HCC)   Lobar pneumonia (HCC)   Major depression in complete remission (HCC)   Acute on chronic respiratory failure with hypoxia (HCC) Acute on chronic respiratory failure with hypoxia due to COPD exacerbation: Her oxygen had to be increased she was started on IV steroids and antibiotics and inhalers she completed her course in-house.  Possible lobar pneumonia: She was treated with IV antibiotics she completed her course in-house.  Hypokalemia: It was repleted orally now resolved.  Major depression in complete remission: Continue current home medications no changes were made.  New high-grade small bowel obstruction: By the fourth day of her admission she was complaining of abdominal pain has not had a bowel movement. Imaging was done that showed a high-grade small bowel obstruction. NG tube was dropped it was put to suction, surgery was consulted recommended conservative management now resolved. She is tolerating her diet and having regular bowel movements.   Discharge Instructions  Discharge Instructions    Diet - low sodium heart healthy   Complete by:  As directed    Increase activity  slowly   Complete by:  As directed      Allergies as of 06/30/2018      Reactions   Zosyn [piperacillin Sod-tazobactam So] Swelling, Rash      Medication List    TAKE these medications   albuterol 108 (90 Base) MCG/ACT inhaler Commonly known as:  PROVENTIL HFA;VENTOLIN HFA Inhale 2 puffs into the lungs every 6 (six) hours as needed for wheezing or shortness of breath.   ALPRAZolam 0.5 MG tablet Commonly known as:  XANAX Take 1 tablet (0.5 mg total) by mouth 2 (two) times daily as needed for anxiety.   dextromethorphan-guaiFENesin 30-600 MG 12hr tablet Commonly known as:  MUCINEX DM Take 1 tablet by mouth 2 (two) times daily as needed for cough.   iron polysaccharides 150 MG capsule Commonly known as:  NIFEREX Take 1 capsule (150 mg total) by mouth daily.   mometasone-formoterol 200-5 MCG/ACT Aero Commonly known as:  DULERA Inhale 2 puffs into the lungs 2 (two) times daily.   pantoprazole 40 MG tablet Commonly known as:  PROTONIX Take 1 tablet (40 mg total) by mouth 2 (two) times daily before a meal. After 1 month take 1 daily in the morning What changed:    when to take this  additional instructions   propranolol 40 MG tablet Commonly known as:  INDERAL Take 1 tablet (40 mg total) by mouth 2 (two) times daily.   rOPINIRole 2 MG tablet Commonly known as:  REQUIP Take 2 mg by mouth at bedtime.   sertraline 100 MG tablet Commonly known as:  ZOLOFT Take 1 tablet (100 mg total) by mouth at bedtime.   sucralfate 1 GM/10ML suspension Commonly known as:  CARAFATE Take 10 mLs (1 g  total) by mouth 4 (four) times daily -  with meals and at bedtime.   tiotropium 18 MCG inhalation capsule Commonly known as:  SPIRIVA Place 1 capsule (18 mcg total) into inhaler and inhale daily.   traMADol-acetaminophen 37.5-325 MG tablet Commonly known as:  ULTRACET Take 1 tablet by mouth every 6 (six) hours as needed for moderate pain. for pain       Allergies  Allergen  Reactions  . Zosyn [Piperacillin Sod-Tazobactam So] Swelling and Rash    Consultations:  General surgery   Procedures/Studies: Ct Abdomen Pelvis Wo Contrast  Result Date: 06/06/2018 CLINICAL DATA:  Recent fall. Syncopal episodes. Dyspnea. Abdominal distension. History of hemicolectomy, inguinal hernia repair, cholecystectomy, left nephrectomy for renal cell carcinoma and TURBT for bladder cancer. EXAM: CT CHEST, ABDOMEN AND PELVIS WITHOUT CONTRAST TECHNIQUE: Multidetector CT imaging of the chest, abdomen and pelvis was performed following the standard protocol without IV contrast. COMPARISON:  Chest radiograph from earlier today. 03/04/2018 CT abdomen/pelvis. FINDINGS: CT CHEST FINDINGS Cardiovascular: Normal heart size. No significant pericardial effusion/thickening. Left anterior descending coronary atherosclerosis. Atherosclerotic nonaneurysmal thoracic aorta. Normal caliber pulmonary arteries. Mediastinum/Nodes: No discrete thyroid nodules. Unremarkable esophagus. No pathologically enlarged axillary, mediastinal or hilar lymph nodes, noting limited sensitivity for the detection of hilar adenopathy on this noncontrast study. Lungs/Pleura: No pneumothorax. No pleural effusion. Severe centrilobular emphysema with diffuse bronchial wall thickening. Peripheral left upper lobe 4 mm solid pulmonary nodule (series 506/image 85). Mildly thickened parenchymal bands in the basilar right upper lobe and dependent lung bases compatible with mild postinfectious/postinflammatory scarring. No lung masses. No additional significant pulmonary nodules. No acute consolidative airspace disease. Musculoskeletal: No aggressive appearing focal osseous lesions. Moderate compression fractures of the T5, T6, T7, T8 and T9 vertebral bodies, which appear chronic and stable since lateral 10/06/2017 chest radiograph. Moderate thoracic spondylosis. Long segment levocurvature of the thoracolumbar spine. CT ABDOMEN PELVIS FINDINGS  Hepatobiliary: Normal liver size. Stable granulomatous liver dome calcification. No liver mass. Cholecystectomy. Bile ducts are stable and compatible with post cholecystectomy change with CBD diameter 12 mm. Pancreas: Normal, with no mass or duct dilation. Spleen: Normal size. No mass. Adrenals/Urinary Tract: No discrete adrenal nodules. Status post left nephrectomy. No mass or fluid collection in the left nephrectomy bed. No right hydronephrosis. No right renal stones. Simple 1.0 cm upper right renal cysts. No contour deforming right renal lesions. Stable curvilinear calcification in the midline anterior bladder wall. No acute bladder abnormality. Stomach/Bowel: Normal non-distended stomach. Normal caliber small bowel with no small bowel wall thickening. Apparent appendectomy. Subtotal left colectomy with distal colonic anastomosis. No large bowel wall thickening or acute pericolonic fat stranding. Vascular/Lymphatic: Atherosclerotic abdominal aorta with saccular 2.7 cm right abdominal aortic aneurysm (series 505/image 61), stable. No pathologically enlarged lymph nodes in the abdomen or pelvis. Reproductive: Status post hysterectomy, with no abnormal findings at the vaginal cuff. No adnexal mass. Other: No pneumoperitoneum, ascites or focal fluid collection. Ventral abdominal hernia repair mesh with no evidence of recurrent hernia. Musculoskeletal: No aggressive appearing focal osseous lesions. No acute fracture. Severe L4 vertebral compression fracture is chronic and stable since 03/04/2018 CT. Moderate lumbar spondylosis. Long segment levocurvature of the thoracolumbar spine. IMPRESSION: 1. No acute traumatic injury in the chest, abdomen or pelvis. Chronic thoracolumbar vertebral compression fractures as detailed. 2. Severe centrilobular emphysema with diffuse bronchial wall thickening, suggesting COPD. 3. Solitary 4 mm solid left upper lobe pulmonary nodule. No follow-up needed if patient is low-risk.  Non-contrast chest CT can be considered in 12  months if patient is high-risk. This recommendation follows the consensus statement: Guidelines for Management of Incidental Pulmonary Nodules Detected on CT Images:From the Fleischner Society 2017; published online before print (10.1148/[id number]). 4. One vessel coronary atherosclerosis. 5. No acute abnormality in the abdomen or pelvis. Stable postsurgical changes from colonic resection, cholecystectomy, left nephrectomy and ventral hernia repair. No evidence of bowel obstruction or acute bowel inflammation. 6. Stable 2.7 cm saccular right abdominal aortic aneurysm. 7. Aortic Atherosclerosis (ICD10-I70.0) and Emphysema (ICD10-J43.9). Electronically Signed   By: Ilona Sorrel M.D.   On: 06/06/2018 23:02   Dg Chest 2 View  Result Date: 06/21/2018 CLINICAL DATA:  Shortness of breath. EXAM: CHEST - 2 VIEW COMPARISON:  Body CT 06/06/2018 FINDINGS: Cardiomediastinal silhouette is normal. Mediastinal contours appear intact. 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 There is no evidence of pleural effusion or pneumothorax. Marked bilateral emphysema and hyperinflation of the lungs. Linear right middle pulmonary lobe opacity. Osseous structures are without acute abnormality. Soft tissues are grossly normal. IMPRESSION: Marked emphysematous changes of the lungs. Linear right middle lobe opacity may represent atelectasis or airspace consolidation. 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 Electronically Signed   By: Fidela Salisbury M.D.   On: 06/21/2018 15:42   Dg Abd 1 View  Result Date: 06/28/2018 CLINICAL DATA:  Followup for small bowel obstruction. EXAM: ABDOMEN - 1 VIEW COMPARISON:  06/26/2018.  CT, 06/25/2018. FINDINGS: No significant small bowel dilation. Contrast is seen in the colon in the pelvis. Stable well-positioned nasal/orogastric tube. There are bowel anastomosis staples and surgical vascular clips,  stable. IMPRESSION: 1. No radiographic evidence of residual small bowel obstruction. Electronically Signed   By: Lajean Manes M.D.   On: 06/28/2018 08:49   Ct Head Wo Contrast  Result Date: 06/06/2018 CLINICAL DATA:  Multiple syncopal episodes. History of bladder cancer and renal cancer. EXAM: CT HEAD WITHOUT CONTRAST CT CERVICAL SPINE WITHOUT CONTRAST TECHNIQUE: Multidetector CT imaging of the head and cervical spine was performed following the standard protocol without intravenous contrast. Multiplanar CT image reconstructions of the cervical spine were also generated. COMPARISON:  None. FINDINGS: CT HEAD FINDINGS Brain: No evidence of acute infarction, hemorrhage, hydrocephalus, extra-axial collection or mass lesion/mass effect. Likely chronic minimal small vessel ischemic disease of periventricular white matter. Vascular: Atherosclerosis at the skull base. No hyperdense vessel sign. Skull: Negative for fracture or suspicious osseous lesions. Sinuses/Orbits: Intact orbits and globes. Paranasal sinuses are nonacute. Clear mastoids. Other: None CT CERVICAL SPINE FINDINGS Alignment: Maintained cervical lordosis. Slight grade 1 retrolisthesis of C2 on C3. Minimal anterolisthesis of C4 on C5. Skull base and vertebrae: Intact skull base. No cervical spine fracture or suspicious osseous lesions. Soft tissues and spinal canal: No prevertebral soft tissue swelling. No visible canal hematoma. Disc levels: Moderate-to-marked degenerative disc disease C2-3 and from C4 through C7 with mild moderate disc flattening C3-4. Small posterior marginal osteophytes are noted at C4-5 and C5-6. No significant central foraminal stenosis. Mild bilateral facet arthropathy. Uncovertebral joint osteoarthritis is seen at C2-3, left C3-4, on the right at C4-5 and bilaterally at C5-6 and C6-7. Upper chest: Centrilobular emphysema. Aortic atherosclerosis. Extracranial carotid arteriosclerosis. Other: None IMPRESSION: No acute intracranial  abnormality. Chronic appearing minimal small vessel ischemic disease. Cervical spondylosis with facet arthropathy. No acute cervical spine fracture or traumatic listhesis. Electronically Signed   By: Ashley Royalty M.D.   On: 06/06/2018 22:48   Ct Chest Wo Contrast  Result Date: 06/06/2018 CLINICAL DATA:  Recent fall. Syncopal episodes. Dyspnea.  Abdominal distension. History of hemicolectomy, inguinal hernia repair, cholecystectomy, left nephrectomy for renal cell carcinoma and TURBT for bladder cancer. EXAM: CT CHEST, ABDOMEN AND PELVIS WITHOUT CONTRAST TECHNIQUE: Multidetector CT imaging of the chest, abdomen and pelvis was performed following the standard protocol without IV contrast. COMPARISON:  Chest radiograph from earlier today. 03/04/2018 CT abdomen/pelvis. FINDINGS: CT CHEST FINDINGS Cardiovascular: Normal heart size. No significant pericardial effusion/thickening. Left anterior descending coronary atherosclerosis. Atherosclerotic nonaneurysmal thoracic aorta. Normal caliber pulmonary arteries. Mediastinum/Nodes: No discrete thyroid nodules. Unremarkable esophagus. No pathologically enlarged axillary, mediastinal or hilar lymph nodes, noting limited sensitivity for the detection of hilar adenopathy on this noncontrast study. Lungs/Pleura: No pneumothorax. No pleural effusion. Severe centrilobular emphysema with diffuse bronchial wall thickening. Peripheral left upper lobe 4 mm solid pulmonary nodule (series 506/image 85). Mildly thickened parenchymal bands in the basilar right upper lobe and dependent lung bases compatible with mild postinfectious/postinflammatory scarring. No lung masses. No additional significant pulmonary nodules. No acute consolidative airspace disease. Musculoskeletal: No aggressive appearing focal osseous lesions. Moderate compression fractures of the T5, T6, T7, T8 and T9 vertebral bodies, which appear chronic and stable since lateral 10/06/2017 chest radiograph. Moderate thoracic  spondylosis. Long segment levocurvature of the thoracolumbar spine. CT ABDOMEN PELVIS FINDINGS Hepatobiliary: Normal liver size. Stable granulomatous liver dome calcification. No liver mass. Cholecystectomy. Bile ducts are stable and compatible with post cholecystectomy change with CBD diameter 12 mm. Pancreas: Normal, with no mass or duct dilation. Spleen: Normal size. No mass. Adrenals/Urinary Tract: No discrete adrenal nodules. Status post left nephrectomy. No mass or fluid collection in the left nephrectomy bed. No right hydronephrosis. No right renal stones. Simple 1.0 cm upper right renal cysts. No contour deforming right renal lesions. Stable curvilinear calcification in the midline anterior bladder wall. No acute bladder abnormality. Stomach/Bowel: Normal non-distended stomach. Normal caliber small bowel with no small bowel wall thickening. Apparent appendectomy. Subtotal left colectomy with distal colonic anastomosis. No large bowel wall thickening or acute pericolonic fat stranding. Vascular/Lymphatic: Atherosclerotic abdominal aorta with saccular 2.7 cm right abdominal aortic aneurysm (series 505/image 61), stable. No pathologically enlarged lymph nodes in the abdomen or pelvis. Reproductive: Status post hysterectomy, with no abnormal findings at the vaginal cuff. No adnexal mass. Other: No pneumoperitoneum, ascites or focal fluid collection. Ventral abdominal hernia repair mesh with no evidence of recurrent hernia. Musculoskeletal: No aggressive appearing focal osseous lesions. No acute fracture. Severe L4 vertebral compression fracture is chronic and stable since 03/04/2018 CT. Moderate lumbar spondylosis. Long segment levocurvature of the thoracolumbar spine. IMPRESSION: 1. No acute traumatic injury in the chest, abdomen or pelvis. Chronic thoracolumbar vertebral compression fractures as detailed. 2. Severe centrilobular emphysema with diffuse bronchial wall thickening, suggesting COPD. 3. Solitary 4  mm solid left upper lobe pulmonary nodule. No follow-up needed if patient is low-risk. Non-contrast chest CT can be considered in 12 months if patient is high-risk. This recommendation follows the consensus statement: Guidelines for Management of Incidental Pulmonary Nodules Detected on CT Images:From the Fleischner Society 2017; published online before print (10.1148/[id number]). 4. One vessel coronary atherosclerosis. 5. No acute abnormality in the abdomen or pelvis. Stable postsurgical changes from colonic resection, cholecystectomy, left nephrectomy and ventral hernia repair. No evidence of bowel obstruction or acute bowel inflammation. 6. Stable 2.7 cm saccular right abdominal aortic aneurysm. 7. Aortic Atherosclerosis (ICD10-I70.0) and Emphysema (ICD10-J43.9). Electronically Signed   By: Ilona Sorrel M.D.   On: 06/06/2018 23:02   Ct Cervical Spine Wo Contrast  Result Date: 06/06/2018 CLINICAL DATA:  Multiple syncopal episodes. History of bladder cancer and renal cancer. EXAM: CT HEAD WITHOUT CONTRAST CT CERVICAL SPINE WITHOUT CONTRAST TECHNIQUE: Multidetector CT imaging of the head and cervical spine was performed following the standard protocol without intravenous contrast. Multiplanar CT image reconstructions of the cervical spine were also generated. COMPARISON:  None. FINDINGS: CT HEAD FINDINGS Brain: No evidence of acute infarction, hemorrhage, hydrocephalus, extra-axial collection or mass lesion/mass effect. Likely chronic minimal small vessel ischemic disease of periventricular white matter. Vascular: Atherosclerosis at the skull base. No hyperdense vessel sign. Skull: Negative for fracture or suspicious osseous lesions. Sinuses/Orbits: Intact orbits and globes. Paranasal sinuses are nonacute. Clear mastoids. Other: None CT CERVICAL SPINE FINDINGS Alignment: Maintained cervical lordosis. Slight grade 1 retrolisthesis of C2 on C3. Minimal anterolisthesis of C4 on C5. Skull base and vertebrae:  Intact skull base. No cervical spine fracture or suspicious osseous lesions. Soft tissues and spinal canal: No prevertebral soft tissue swelling. No visible canal hematoma. Disc levels: Moderate-to-marked degenerative disc disease C2-3 and from C4 through C7 with mild moderate disc flattening C3-4. Small posterior marginal osteophytes are noted at C4-5 and C5-6. No significant central foraminal stenosis. Mild bilateral facet arthropathy. Uncovertebral joint osteoarthritis is seen at C2-3, left C3-4, on the right at C4-5 and bilaterally at C5-6 and C6-7. Upper chest: Centrilobular emphysema. Aortic atherosclerosis. Extracranial carotid arteriosclerosis. Other: None IMPRESSION: No acute intracranial abnormality. Chronic appearing minimal small vessel ischemic disease. Cervical spondylosis with facet arthropathy. No acute cervical spine fracture or traumatic listhesis. Electronically Signed   By: Ashley Royalty M.D.   On: 06/06/2018 22:48   Ct Abdomen Pelvis W Contrast  Result Date: 06/25/2018 CLINICAL DATA:  Suprapubic abdominal pain, acute onset CT abdomen pelvis of 06/06/2018 . EXAM: CT ABDOMEN AND PELVIS WITH CONTRAST TECHNIQUE: Multidetector CT imaging of the abdomen and pelvis was performed using the standard protocol following bolus administration of intravenous contrast. CONTRAST:  62mL OMNIPAQUE IOHEXOL 300 MG/ML SOLN, 48mL OMNIPAQUE IOHEXOL 300 MG/ML SOLN COMPARISON:  None. FINDINGS: Lower chest: Chronic emphysematous changes are present at the lung bases as well as linear scarring within the lingula and right lower lobe with atelectasis as well. No pneumonia or pleural effusion is seen. The heart is mildly enlarged. No pericardial effusion is seen. A single calcified granuloma is present at the right lung base just above the hemidiaphragm. Hepatobiliary: The liver enhances with no focal abnormality. Surgical clips are present from prior cholecystectomy and the common bile duct is very prominent as noted  previously. At the level of the ampulla of the common bile duct measures 12 mm. Pancreas: The pancreas is normal in size and the pancreatic duct is not dilated. The pancreas appears relatively atrophic Spleen: The spleen is unremarkable. A splenic cyst or hemangioma is noted medially. Adrenals/Urinary Tract: The adrenal glands appear normal. 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 The right kidney appears compensatory hypertrophied. On delayed images, the pelvocaliceal systems are unremarkable. Small renal cysts are noted. No definite solid right renal lesion is seen. The right ureter appears normal in caliber. The urinary bladder is relatively decompressed and difficult to evaluate. Stomach/Bowel: The stomach is filled with oral contrast with no abnormality evident. There are significantly dilated loops of small bowel in this patient was undergone prior colectomy with only the rectum remaining. The exact point of obstruction partial obstruction is difficult to determine. There are however nondilated loops of small bowel distally gamma and the obstruction most likely is due to and adhesion. No definite edema of small bowel  or pneumatosis is seen currently. There are differential air-fluid levels present within the dilated fluid-filled small bowel loops. Some fecalization within dilated distal small bowel loops is noted. No free fluid is noted within the pelvis. Vascular/Lymphatic: The abdominal aorta is normal in caliber with significant abdominal aortic atherosclerosis noted. No adenopathy is seen. Reproductive: The uterus has previously been resected. No adnexal lesion is seen. No fluid is noted within the pelvis. Other: No abdominal wall hernia is seen. Musculoskeletal: The compression deformity of L4 vertebral body is stable. Lumbar curvature convex to the left is unchanged as well. IMPRESSION: 1. Relatively high-grade partial small bowel obstruction with significant dilatation of small bowel loops although there  are distal bowel loops which are not distended. The exact point of obstruction is difficult to determine but most likely is due to an adhesion. No present edema of small bowel is seen and no pneumatosis is evident. 2. Prior left nephrectomy with compensatory hypertrophy of the right kidney. 3. This report will be called to the referring physician by radiology assistant. Electronically Signed   By: Ivar Drape M.D.   On: 06/25/2018 17:00   Ct T-spine No Charge  Result Date: 06/06/2018 CLINICAL DATA:  Post fall with hematoma to back. EXAM: CT THORACIC SPINE WITHOUT CONTRAST TECHNIQUE: Multidetector CT images of the thoracic were obtained using the standard protocol without intravenous contrast. Performed in conjunction with CT of the lumbar spine and CT of the chest, abdomen, and pelvis. COMPARISON:  No prior chest are thoracic spine CT. Chest radiograph 10/06/2017, with limited spinal visualization. FINDINGS: Alignment: Moderate S-shaped scoliotic curvature of the thoracolumbar spine. Vertebrae: Loss of height at T5, T6, T7, T8, and T9. No paravertebral soft tissue thickening to suggest acute fracture. The posterior elements are intact. Paraspinal and other soft tissues: No anterior paravertebral soft tissue thickening. There is mild posterior paravertebral subcutaneous soft tissue density suggesting soft tissue contusion. Chest better assessed on full field of view chest CT. Disc levels: Mild diffuse disc space loss. Facet hypertrophy most prominent in the lower thoracic spine. IMPRESSION: 1. Compression fractures at T5 through T9, mild to moderate. No definite paravertebral edema to suggest acute fracture, however no prior exams are available for direct comparison. MRI could be obtained to evaluate for acuity based on clinical concern. 2. Posterior subcutaneous soft tissue contusion suspected. 3. Moderate scoliotic curvature with mild multilevel degenerative change. Electronically Signed   By: Keith Rake  M.D.   On: 06/06/2018 23:08   Ct L-spine No Charge  Result Date: 06/06/2018 CLINICAL DATA:  Post fall with hematoma to back. EXAM: CT LUMBAR SPINE WITHOUT CONTRAST TECHNIQUE: Multidetector CT imaging of the lumbar spine was performed without intravenous contrast administration. Multiplanar CT image reconstructions were also generated. Performed in conjunction with CT of the thoracic spine, and CT of the chest, abdomen, and pelvis. COMPARISON:  Abdominopelvic CT 11/25/2017 FINDINGS: Segmentation: 5 lumbar type vertebrae. Alignment: Moderate levo scoliotic curvature of the lower thoracic and upper lumbar spine. Minimal anterolisthesis of L2 on L3. Vertebrae: Chronic moderate to severe L4 compression fracture, unchanged from prior exam. Chronic loss of height of L2 with concave inferior endplate. No acute compression fracture. The posterior elements appear intact. No acute lumbar fracture. Paraspinal and other soft tissues: No confluent soft tissue hematoma. Abdominopelvic evaluation better assessed on dedicated abdominopelvic CT. Disc levels: Disc space narrowing and endplate spurring related scoliotic curvature most prominent at L1-L2. Disc space narrowing and endplate spurring at T2-I7. Multilevel facet arthropathy. IMPRESSION: 1. No acute  fracture of the lumbar spine. 2. Remote moderate severe L4 compression fracture. Chronic mild loss of height of L2 with inferior endplate concavity. 3. Scoliosis and multilevel degenerative change. Electronically Signed   By: Keith Rake M.D.   On: 06/06/2018 23:00   Dg Chest Portable 1 View  Result Date: 06/06/2018 CLINICAL DATA:  Syncope. EXAM: PORTABLE CHEST 1 VIEW COMPARISON:  Radiographs of April 23, 2018. FINDINGS: Stable cardiomediastinal silhouette. Atherosclerosis thoracic aorta is noted. No pneumothorax or pleural effusion is noted. No acute pulmonary disease is noted. Bony thorax is unremarkable. IMPRESSION: No acute cardiopulmonary abnormality seen.  Aortic Atherosclerosis (ICD10-I70.0). Electronically Signed   By: Marijo Conception, M.D.   On: 06/06/2018 21:05   Dg Abd Portable 1v  Result Date: 06/26/2018 CLINICAL DATA:  NG tube placement. EXAM: PORTABLE ABDOMEN - 1 VIEW COMPARISON:  One-view abdomen 06/26/2018 FINDINGS: The side port of the NG tube is in the fundus the stomach. The stomach is decompressed. The heart is enlarged. Small bilateral pleural effusions are present. Aortic atherosclerosis is noted. Scoliosis is present. IMPRESSION: The side port of the NG tube is in the fundus of the stomach. Electronically Signed   By: San Morelle M.D.   On: 06/26/2018 15:56   Dg Abd Portable 1v-small Bowel Obstruction Protocol-initial, 8 Hr Delay  Result Date: 06/26/2018 CLINICAL DATA:  8 hour post contrast small-bowel protocol. EXAM: PORTABLE ABDOMEN - 1 VIEW COMPARISON:  CT abdomen and pelvis 06/25/2018 FINDINGS: Residual contrast material is seen in the stomach and in dilated small bowel. No contrast material demonstrated in the colon, suggesting high-grade obstruction. Residual contrast material also noted in the bladder. Surgical clips in the abdomen. Thoracolumbar scoliosis convex towards the left. Degenerative changes in the spine and hips. IMPRESSION: Contrast material demonstrated in dilated small bowel without colonic contrast, suggesting high-grade obstruction. Electronically Signed   By: Lucienne Capers M.D.   On: 06/26/2018 03:10   Dg Addison Bailey G Tube Plc W/fl W/rad  Result Date: 06/26/2018 CLINICAL DATA:  Small bowel obstruction. Gastrointestinal decompression. EXAM: NASO G TUBE PLACEMENT WITH FL AND WITH RAD CONTRAST:  None. FLUOROSCOPY TIME:  Fluoroscopy Time:  0 minutes and 0 seconds. Radiation Exposure Index (if provided by the fluoroscopic device): Number of Acquired Spot Images: COMPARISON:  Abdomen and pelvis CT from 06/25/2018 was reviewed. FINDINGS: Prior to starting the procedure, the patient's nostrils were treated with  viscous lidocaine. Lidocaine spray was used to treat the posterior oropharynx. The patient was quite concerned about the procedure and requested that I verbalize to her that I would not continue with the procedure if she told me to stop. I reassured her that I would not continue against her wishes. After careful discussion with the patient and counseling her that nasogastric decompression is necessary therapy and would likely make her feel much better, nasogastric tube placement via the right nostril was attempted. The patient clearly and loudly verbalized that she wanted me to stop, so I did. After further discussion, she agreed to allow placement of an Amplatz wire to facilitate guidance of the NG tube. On initial placement of the Amplatz wire into the posterior nasopharynx, she began to loudly vocalize that she wanted me to stop. IMPRESSION: NG tube could not be placed due to patient request that the procedure be aborted. Electronically Signed   By: Misty Stanley M.D.   On: 06/26/2018 14:07   Vas US Carotid  Result Date: 06/07/2018 Carotid Arterial Duplex Study Indications:       Syncope.  Risk Factors:      Hyperlipidemia. Other Factors:     COPD, history of bladder cancer. Comparison Study:  No prior study on file for comparison. Performing Technologist: Sharion Dove RVS  Examination Guidelines: A complete evaluation includes B-mode imaging, spectral Doppler, color Doppler, and power Doppler as needed of all accessible portions of each vessel. Bilateral testing is considered an integral part of a complete examination. Limited examinations for reoccurring indications may be performed as noted.  Right Carotid Findings: +----------+--------+--------+--------+------------+------------------+           PSV cm/sEDV cm/sStenosisDescribe    Comments           +----------+--------+--------+--------+------------+------------------+ CCA Prox  119     26                          intimal thickening  +----------+--------+--------+--------+------------+------------------+ CCA Distal93      23                          intimal thickening +----------+--------+--------+--------+------------+------------------+ ICA Prox  70      16              heterogenous                   +----------+--------+--------+--------+------------+------------------+ ICA Distal77      25                                             +----------+--------+--------+--------+------------+------------------+ ECA       96      13                                             +----------+--------+--------+--------+------------+------------------+ +----------+--------+-------+--------+-------------------+           PSV cm/sEDV cmsDescribeArm Pressure (mmHG) +----------+--------+-------+--------+-------------------+ Subclavian131                                        +----------+--------+-------+--------+-------------------+ +---------+--------+--+--------+--+ VertebralPSV cm/s48EDV cm/s10 +---------+--------+--+--------+--+  Left Carotid Findings: +----------+--------+--------+--------+------------+------------------+           PSV cm/sEDV cm/sStenosisDescribe    Comments           +----------+--------+--------+--------+------------+------------------+ CCA Prox  118     24                          intimal thickening +----------+--------+--------+--------+------------+------------------+ CCA Distal100     20                          intimal thickening +----------+--------+--------+--------+------------+------------------+ ICA Prox  95      25              heterogenous                   +----------+--------+--------+--------+------------+------------------+ ICA Distal111     27                                             +----------+--------+--------+--------+------------+------------------+  ECA       83      17                                              +----------+--------+--------+--------+------------+------------------+ +----------+--------+--------+--------+-------------------+ SubclavianPSV cm/sEDV cm/sDescribeArm Pressure (mmHG) +----------+--------+--------+--------+-------------------+           152                                         +----------+--------+--------+--------+-------------------+ +---------+--------+--+--------+--+ VertebralPSV cm/s53EDV cm/s14 +---------+--------+--+--------+--+  Summary: Right Carotid: Velocities in the right ICA are consistent with a 1-39% stenosis. Left Carotid: Velocities in the left ICA are consistent with a 1-39% stenosis. Vertebrals:  Bilateral vertebral arteries demonstrate antegrade flow. Subclavians: Normal flow hemodynamics were seen in bilateral subclavian              arteries. *See table(s) above for measurements and observations.  Electronically signed by Harold Barban MD on 06/07/2018 at 8:07:34 PM.    Final     Subjective: No complains  Discharge Exam: Vitals:   06/30/18 0452 06/30/18 0819  BP: 110/65   Pulse: 93   Resp: 12   Temp: 98.5 F (36.9 C)   SpO2: 96% 97%   Vitals:   06/29/18 1954 06/29/18 2031 06/30/18 0452 06/30/18 0819  BP:  132/73 110/65   Pulse:  (!) 103 93   Resp:  18 12   Temp:  97.8 F (36.6 C) 98.5 F (36.9 C)   TempSrc:  Oral Oral   SpO2: 93% 96% 96% 97%  Weight:      Height:        General: Pt is alert, awake, not in acute distress Cardiovascular: RRR, S1/S2 +, no rubs, no gallops Respiratory: CTA bilaterally, no wheezing, no rhonchi Abdominal: Soft, NT, ND, bowel sounds + Extremities: no edema, no cyanosis    The results of significant diagnostics from this hospitalization (including imaging, microbiology, ancillary and laboratory) are listed below for reference.     Microbiology: Recent Results (from the past 240 hour(s))  Culture, blood (routine x 2)     Status: None   Collection Time: 06/21/18  5:47 PM  Result Value  Ref Range Status   Specimen Description   Final    BLOOD RIGHT ANTECUBITAL Performed at [facility] [address], [place], [place] [address]    Special Requests   Final    BOTTLES DRAWN AEROBIC AND ANAEROBIC Blood Culture adequate volume Performed at [facility] [address], [place], [place] [address]    Culture   Final    NO GROWTH 5 DAYS Performed at [facility], [place] [address], [place], [place] [address]    Report Status 06/26/2018 FINAL  Final  Culture, blood (routine x 2)     Status: None   Collection Time: 06/21/18  5:51 PM  Result Value Ref Range Status   Specimen Description   Final    BLOOD RIGHT WRIST Performed at [facility] [address], [place], [place] [address]    Special Requests   Final    BOTTLES DRAWN AEROBIC AND ANAEROBIC Blood Culture adequate volume Performed at [facility] [address] [place], [place] [address]    Culture  Final    NO GROWTH 5 DAYS Performed at [facility], [place] [address], [place], [place] [address]    Report Status 06/26/2018 FINAL  Final     Labs: BNP (last 3 results) Recent Labs    06/21/18 1542  BNP 893.7*   Basic Metabolic Panel: Recent Labs  Lab 06/24/18 0552 06/25/18 0634 06/26/18 0553 06/27/18 0451 06/28/18 0512 06/29/18 0533  NA 142  --  139 134* 135 137  K 4.2  --  3.4* 5.4* 3.6 3.6  CL 106  --  94* 100 98 102  CO2 28  --  31 27 29 29   GLUCOSE 125*  --  117* 90 74 88  BUN 31*  --  26* 27* 22 13  CREATININE 0.93  --  0.95 0.87 0.76 0.68  CALCIUM 9.1  --  9.4 8.2* 8.2* 8.2*  MG 2.1 2.1 2.5*  --   --   --    Liver Function Tests: No results for input(s): AST, ALT, ALKPHOS, BILITOT, PROT, ALBUMIN in the last 168 hours. No results for input(s): LIPASE, AMYLASE in the last 168 hours. No results for input(s): AMMONIA in the last 168 hours. CBC: Recent Labs  Lab 06/24/18 0552 06/25/18 1737  WBC 11.0*  12.1*  NEUTROABS 10.0* 10.3*  HGB 10.3* 12.8  HCT 36.1 43.8  MCV 90.9 89.2  PLT 237 262   Cardiac Enzymes: No results for input(s): CKTOTAL, CKMB, CKMBINDEX, TROPONINI in the last 168 hours. BNP: Invalid input(s): POCBNP CBG: No results for input(s): GLUCAP in the last 168 hours. D-Dimer No results for input(s): DDIMER in the last 72 hours. Hgb A1c No results for input(s): HGBA1C in the last 72 hours. Lipid Profile No results for input(s): CHOL, HDL, LDLCALC, TRIG, CHOLHDL, LDLDIRECT in the last 72 hours. Thyroid function studies No results for input(s): TSH, T4TOTAL, T3FREE, THYROIDAB in the last 72 hours.  Invalid input(s): FREET3 Anemia work up No results for input(s): VITAMINB12, FOLATE, FERRITIN, TIBC, IRON, RETICCTPCT in the last 72 hours. Urinalysis    Component Value Date/Time   COLORURINE YELLOW 06/25/2018 1305   APPEARANCEUR CLEAR 06/25/2018 1305   LABSPEC 1.014 06/25/2018 1305   PHURINE 6.0 06/25/2018 1305   GLUCOSEU NEGATIVE 06/25/2018 1305   HGBUR MODERATE (A) 06/25/2018 1305   BILIRUBINUR NEGATIVE 06/25/2018 1305   KETONESUR NEGATIVE 06/25/2018 1305   PROTEINUR 30 (A) 06/25/2018 1305   NITRITE NEGATIVE 06/25/2018 1305   LEUKOCYTESUR NEGATIVE 06/25/2018 1305   Sepsis Labs Invalid input(s): PROCALCITONIN,  WBC,  LACTICIDVEN Microbiology Recent Results (from the past 240 hour(s))  Culture, blood (routine x 2)     Status: None   Collection Time: 06/21/18  5:47 PM  Result Value Ref Range Status   Specimen Description   Final    BLOOD RIGHT ANTECUBITAL Performed at [facility], [place] [address], [place], [place] [address]    Special Requests   Final    BOTTLES DRAWN AEROBIC AND ANAEROBIC Blood Culture adequate volume Performed at [facility] [address], [place], [place] [address]    Culture   Final    NO GROWTH 5 DAYS Performed at [facility], [place] [address], [place], [place] [address]    Report  Status 06/26/2018 FINAL  Final  Culture, blood (routine x 2)     Status: None   Collection Time: 06/21/18  5:51 PM  Result Value Ref Range Status   Specimen Description   Final  BLOOD RIGHT WRIST Performed at [facility] [address], [place], [place] [address]    Special Requests   Final    BOTTLES DRAWN AEROBIC AND ANAEROBIC Blood Culture adequate volume Performed at [facility] [address], [place], [place] [address]    Culture   Final    NO GROWTH 5 DAYS Performed at [facility], [place] [address], [place], [place] [address]    Report Status 06/26/2018 FINAL  Final     Time coordinating discharge: 40 minutes  SIGNED:   Charlynne Cousins, MD  [facility] 06/30/2018, 10:30 AM Pager   If 7PM-7AM, please contact night-coverage [email or web address] Password [email or web address]

## 2019-01-20 IMAGING — CT CT T SPINE W/O CM
3 series · 12 of 33 positions shown, 14 images · non-contrast
Comparison: No prior chest are thoracic spine CT. Chest radiograph
10/06/2017, with limited spinal visualization.

CLINICAL DATA: Post fall with hematoma to back.

EXAM:
CT THORACIC SPINE WITHOUT CONTRAST
TECHNIQUE: Multidetector CT images of the thoracic were obtained using the
standard protocol without intravenous contrast. Performed in
conjunction with CT of the lumbar spine and CT of the chest,
abdomen, and pelvis.

[Series 5: t spine · axial · 0.46mm/px · z∈[-462,-264]mm · 4 of 143 slices shown, 5 images (1 of 3)]
[im 22/143  soft-tissue]
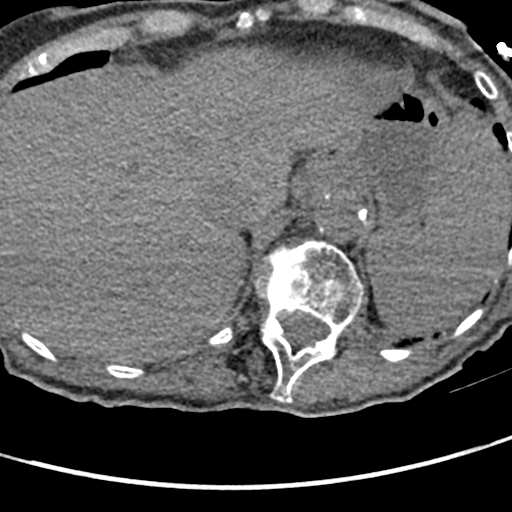
[im 22/143  bone]
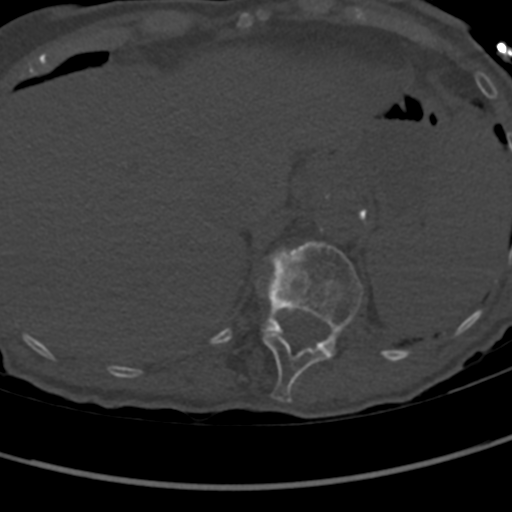
[im 55/143  bone]
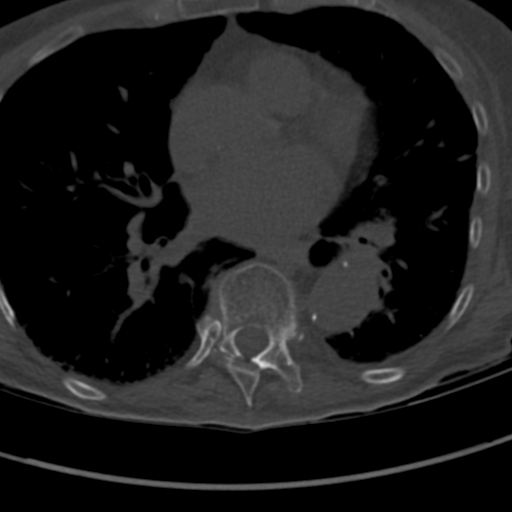
[im 88/143  bone]
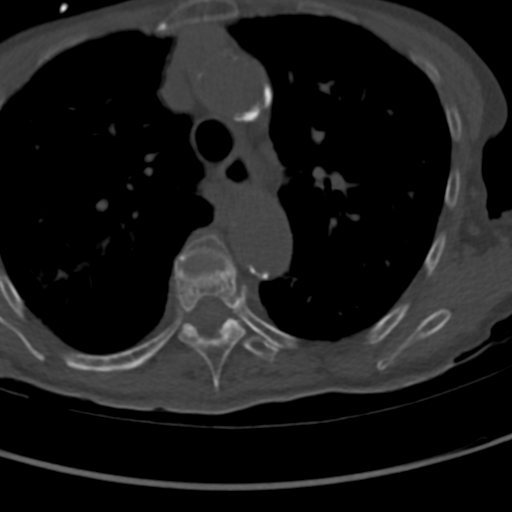
[im 121/143  bone]
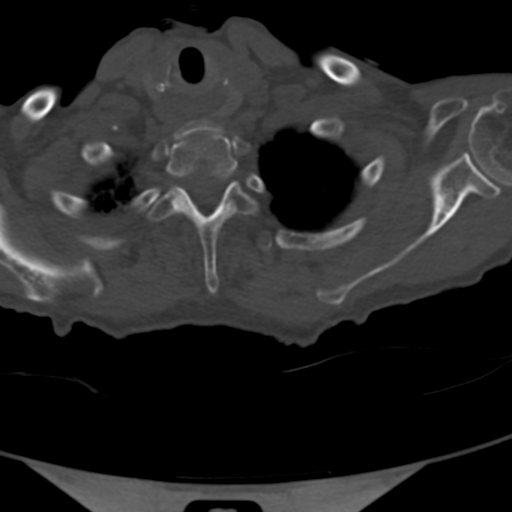

[Series 7: t spine · coronal · 0.46mm/px · 3 of 80 slices shown (2 of 3)]
[im 16/80  bone]
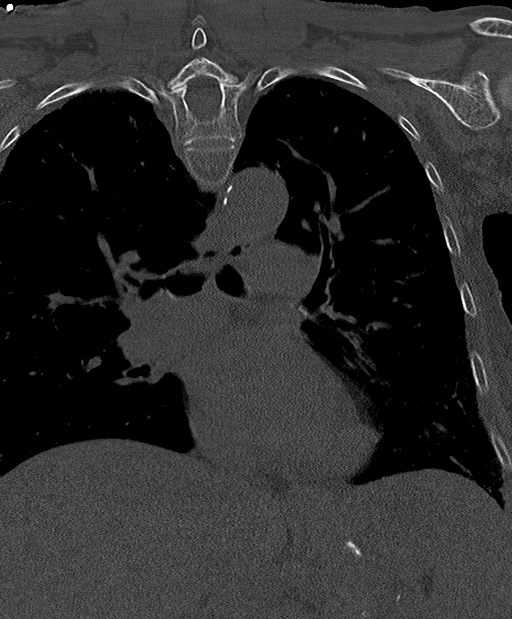
[im 32/80  bone]
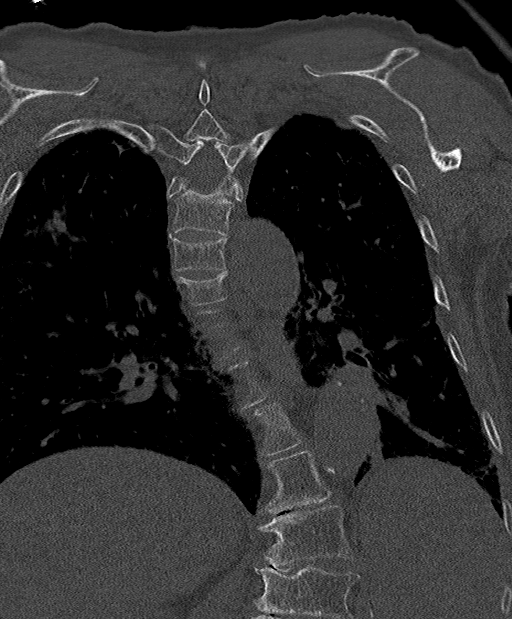
[im 48/80  bone]
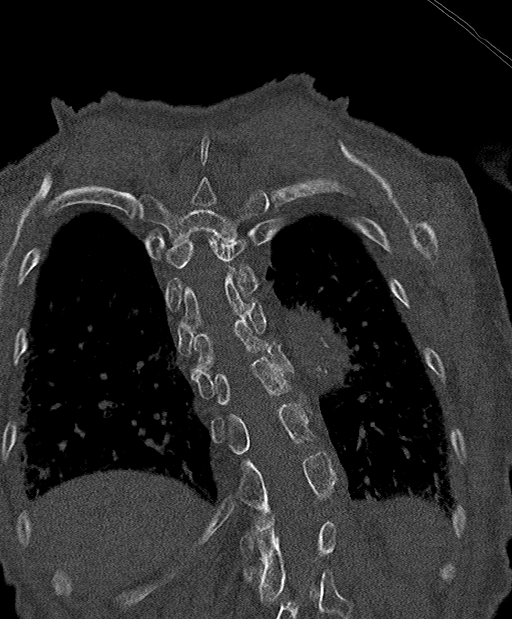

[Series 8: t spine · sagittal · 0.33mm/px · 5 of 107 slices shown, 6 images (3 of 3)]
[im 36/107  bone]
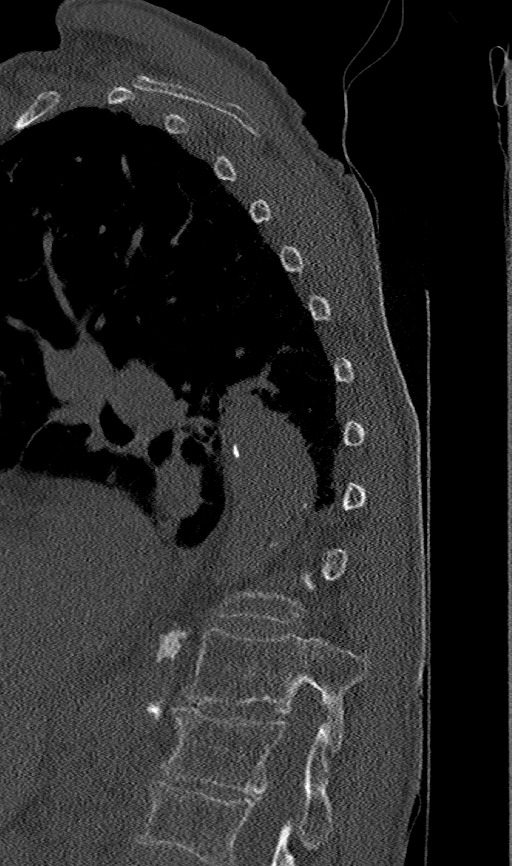
[im 45/107  bone]
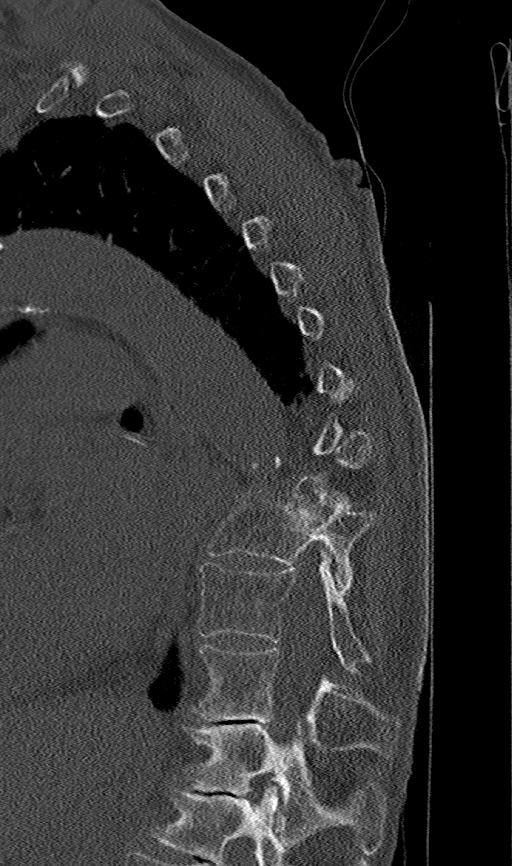
[im 54/107  soft-tissue]
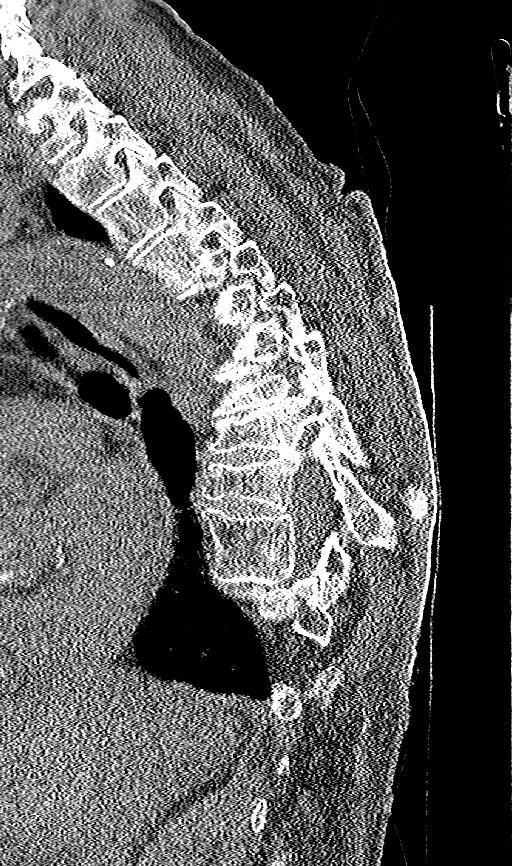
[im 54/107  bone]
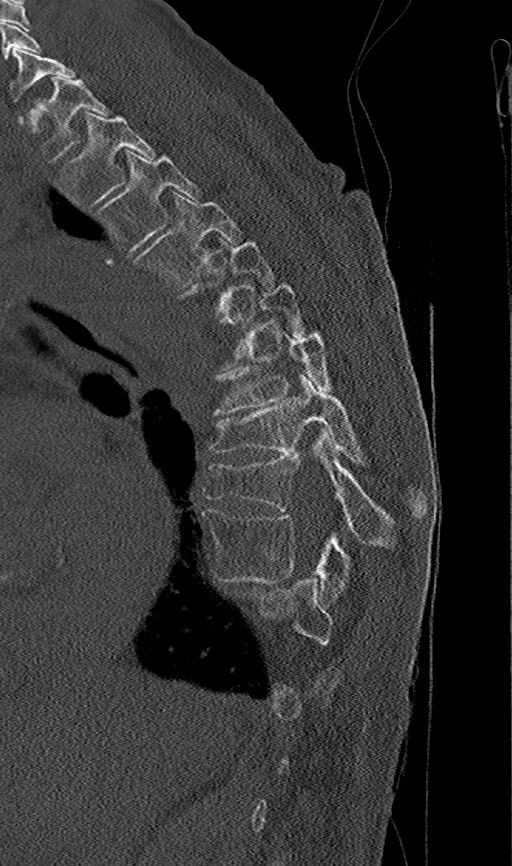
[im 62/107  bone]
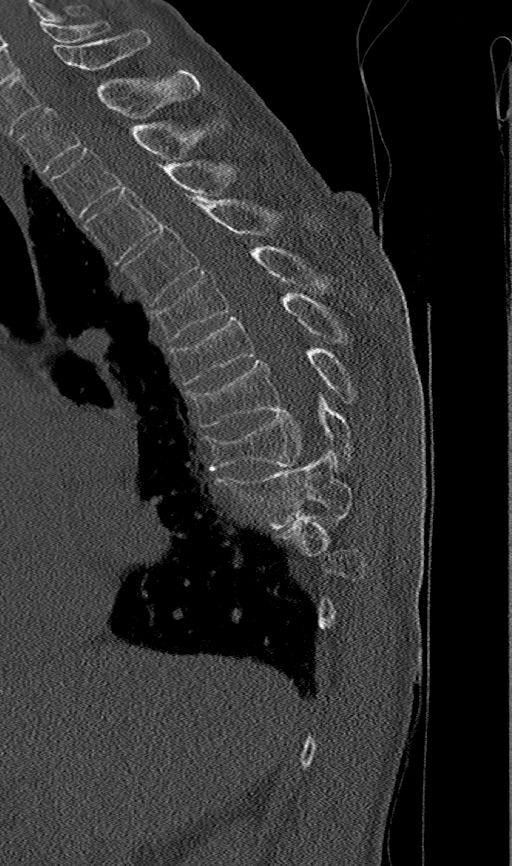
[im 71/107  bone]
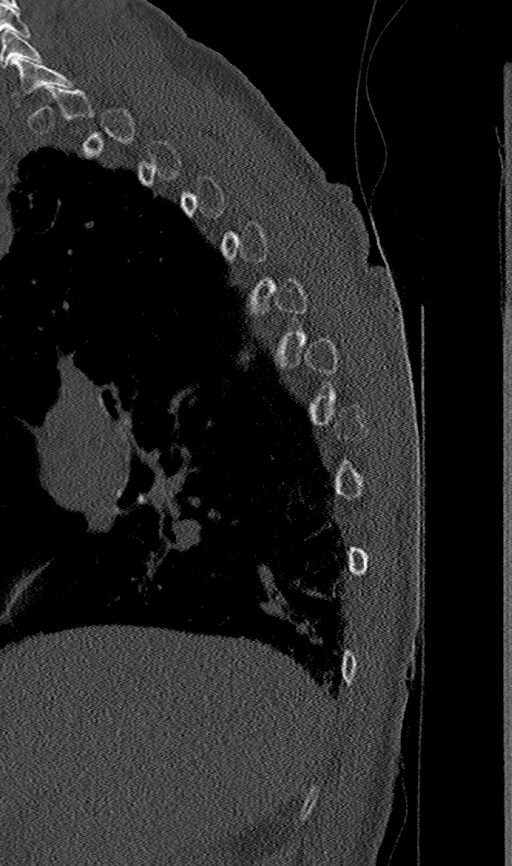

[12 of 33 positions shown; findings below may reference images not displayed]

FINDINGS: Alignment: Moderate S-shaped scoliotic curvature of the
thoracolumbar spine.

Vertebrae: Loss of height at T5, T6, T7, T8, and T9. No
paravertebral soft tissue thickening to suggest acute fracture. The
posterior elements are intact.

Paraspinal and other soft tissues: No anterior paravertebral soft
tissue thickening. There is mild posterior paravertebral
subcutaneous soft tissue density suggesting soft tissue contusion.
Chest better assessed on full field of view chest CT.

Disc levels: Mild diffuse disc space loss. Facet hypertrophy most
prominent in the lower thoracic spine.
IMPRESSION: 1. Compression fractures at T5 through T9, mild to moderate. No
definite paravertebral edema to suggest acute fracture, however no
prior exams are available for direct comparison. MRI could be
obtained to evaluate for acuity based on clinical concern.
2. Posterior subcutaneous soft tissue contusion suspected.
3. Moderate scoliotic curvature with mild multilevel degenerative
change.

## 2019-02-09 IMAGING — DX DG ABD PORTABLE 1V
1 series · 1 of 1 positions shown · non-contrast
Comparison: One-view abdomen 06/26/2018

CLINICAL DATA: NG tube placement.

EXAM:
PORTABLE ABDOMEN - 1 VIEW

[abdomen kub]
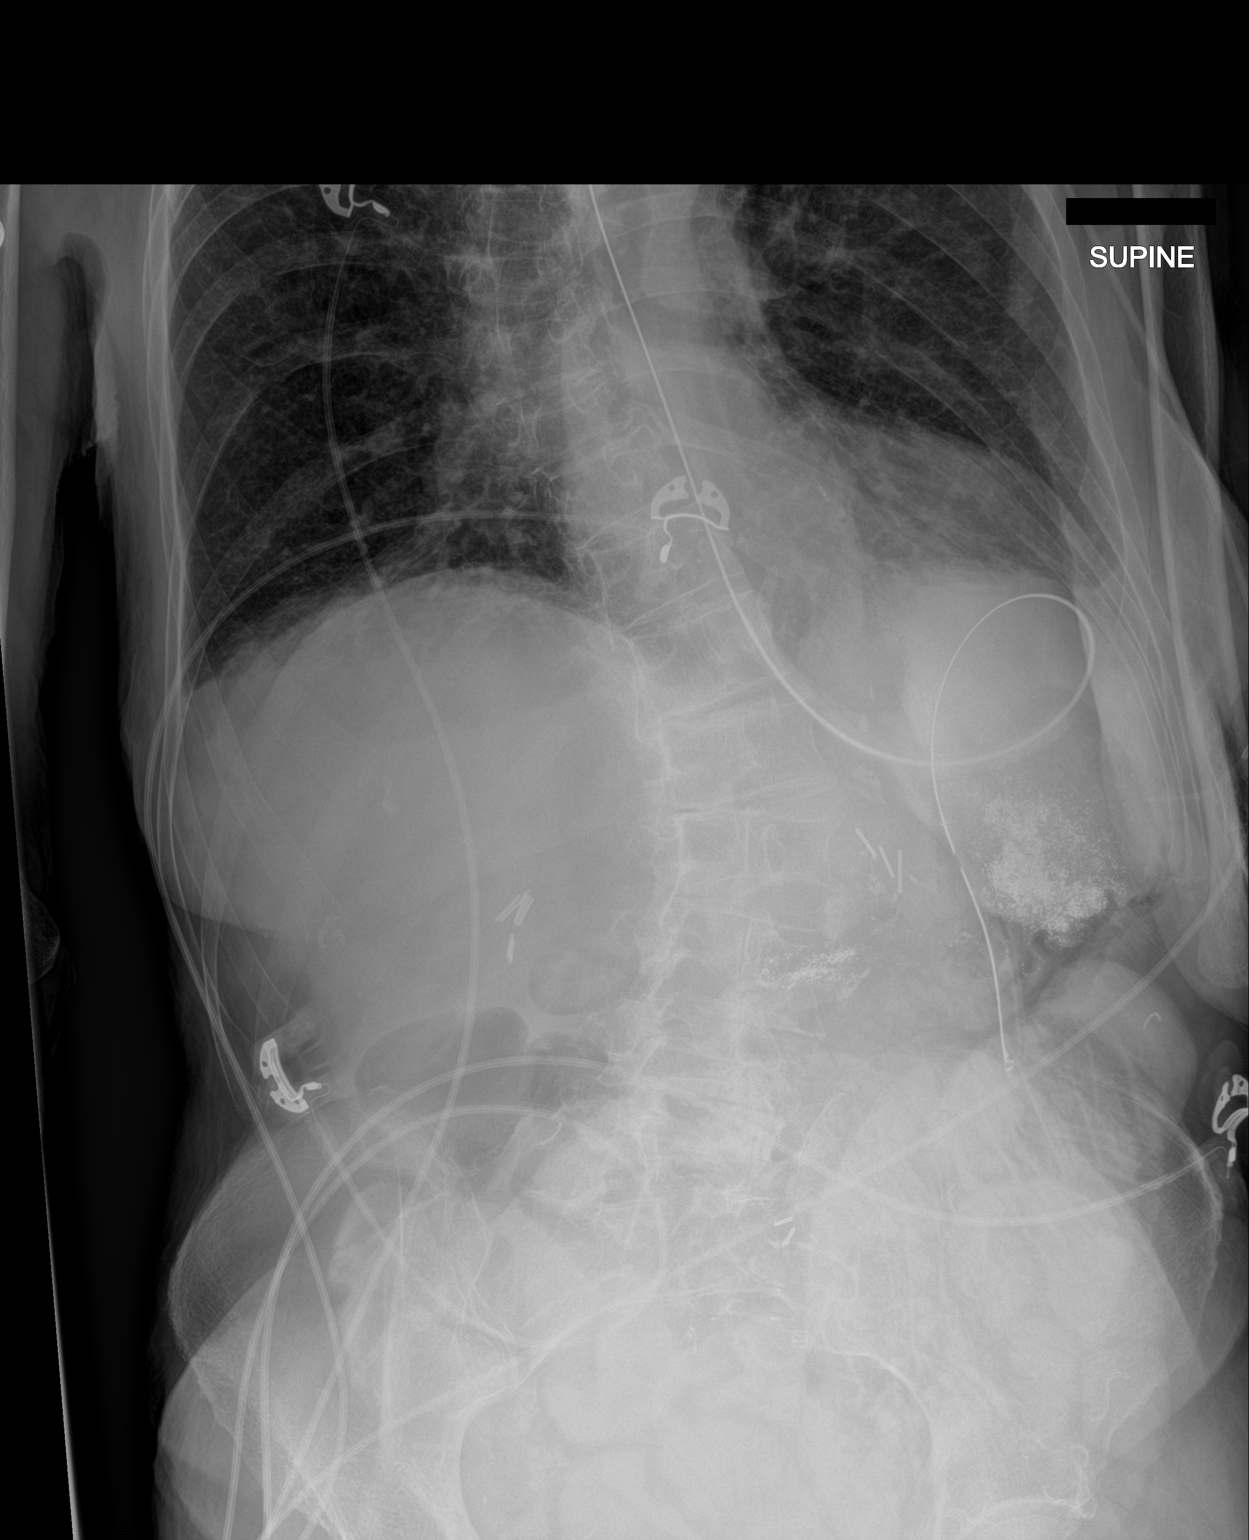

[1 of 1 positions shown; findings below may reference images not displayed]

FINDINGS: The side port of the NG tube is in the fundus the stomach. The
stomach is decompressed.

The heart is enlarged. Small bilateral pleural effusions are
present. Aortic atherosclerosis is noted. Scoliosis is present.
IMPRESSION: The side port of the NG tube is in the fundus of the stomach.

## 2019-02-17 DEATH — deceased
